# Patient Record
Sex: Male | Born: 1995
Health system: Southern US, Community
[De-identification: ages and names within clinical notes are randomized; demographics above are authoritative.]

## PROBLEM LIST (undated history)

## (undated) DIAGNOSIS — F419 Anxiety disorder, unspecified: Secondary | ICD-10-CM

## (undated) DIAGNOSIS — F17200 Nicotine dependence, unspecified, uncomplicated: Secondary | ICD-10-CM

## (undated) DIAGNOSIS — R519 Headache, unspecified: Secondary | ICD-10-CM

## (undated) DIAGNOSIS — M1990 Unspecified osteoarthritis, unspecified site: Secondary | ICD-10-CM

## (undated) DIAGNOSIS — I499 Cardiac arrhythmia, unspecified: Secondary | ICD-10-CM

## (undated) DIAGNOSIS — F32A Depression, unspecified: Secondary | ICD-10-CM

## (undated) DIAGNOSIS — F909 Attention-deficit hyperactivity disorder, unspecified type: Secondary | ICD-10-CM

## (undated) HISTORY — DX: Nicotine dependence, unspecified, uncomplicated: F17.200

## (undated) HISTORY — PX: LEG SURGERY: SHX1003

## (undated) HISTORY — DX: Unspecified osteoarthritis, unspecified site: M19.90

## (undated) HISTORY — DX: Attention-deficit hyperactivity disorder, unspecified type: F90.9

---
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
## 2013-11-03 ENCOUNTER — Encounter (HOSPITAL_COMMUNITY): Payer: Self-pay | Admitting: Emergency Medicine

## 2013-11-03 ENCOUNTER — Emergency Department (HOSPITAL_COMMUNITY)
Admission: EM | Admit: 2013-11-03 | Discharge: 2013-11-03 | Disposition: A | Discharge status: Home / Self Care | Payer: Self-pay | Attending: Emergency Medicine | Admitting: Emergency Medicine

## 2013-11-03 DIAGNOSIS — Y9389 Activity, other specified: Secondary | ICD-10-CM | POA: Insufficient documentation

## 2013-11-03 DIAGNOSIS — S199XXA Unspecified injury of neck, initial encounter: Principal | ICD-10-CM

## 2013-11-03 DIAGNOSIS — F17200 Nicotine dependence, unspecified, uncomplicated: Secondary | ICD-10-CM | POA: Insufficient documentation

## 2013-11-03 DIAGNOSIS — Y92410 Unspecified street and highway as the place of occurrence of the external cause: Secondary | ICD-10-CM | POA: Insufficient documentation

## 2013-11-03 DIAGNOSIS — S0993XA Unspecified injury of face, initial encounter: Secondary | ICD-10-CM | POA: Insufficient documentation

## 2013-11-03 MED ORDER — IBUPROFEN 400 MG PO TABS: 400.0000 mg | ORAL_TABLET | Freq: Four times a day (QID) | ORAL | Status: DC | PRN

## 2013-11-03 MED ORDER — METHOCARBAMOL 500 MG PO TABS: 500.0000 mg | ORAL_TABLET | Freq: Two times a day (BID) | ORAL | Status: DC
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
## 2013-11-03 NOTE — ED Provider Notes (Signed)
Medical screening examination/treatment/procedure(s) were performed by non-physician practitioner and as supervising physician I was immediately available for consultation/collaboration.  Richarda Blade, MD 11/03/13 [phone]
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
## 2014-04-26 LAB — BASIC METABOLIC PANEL
BUN: 10 mg/dL (ref 4–21)
CREATININE: 1.1 mg/dL (ref 0.6–1.3)
Glucose: 105 mg/dL
POTASSIUM: 4 mmol/L (ref 3.4–5.3)
Sodium: 140 mmol/L (ref 137–147)

## 2014-04-26 LAB — CBC AND DIFFERENTIAL
HCT: 46 % (ref 41–53)
Hemoglobin: 15.5 g/dL (ref 13.5–17.5)
Neutrophils Absolute: 60 /uL
Platelets: 269 10*3/uL (ref 150–399)
WBC: 6.2 10^3/mL

## 2014-04-26 LAB — HEPATIC FUNCTION PANEL: BILIRUBIN, TOTAL: 0.6 mg/dL

## 2014-10-07 ENCOUNTER — Ambulatory Visit (INDEPENDENT_AMBULATORY_CARE_PROVIDER_SITE_OTHER): Payer: 59 | Admitting: Family Medicine

## 2014-10-07 ENCOUNTER — Encounter: Payer: Self-pay | Admitting: Family Medicine

## 2014-10-07 VITALS — BP 120/62 | Temp 98.2°F | Ht 73.0 in | Wt 152.0 lb

## 2014-10-07 DIAGNOSIS — F411 Generalized anxiety disorder: Secondary | ICD-10-CM

## 2014-10-07 DIAGNOSIS — F329 Major depressive disorder, single episode, unspecified: Secondary | ICD-10-CM

## 2014-10-07 DIAGNOSIS — Z87891 Personal history of nicotine dependence: Secondary | ICD-10-CM | POA: Insufficient documentation

## 2014-10-07 DIAGNOSIS — R0789 Other chest pain: Secondary | ICD-10-CM

## 2014-10-07 DIAGNOSIS — Z7251 High risk heterosexual behavior: Secondary | ICD-10-CM

## 2014-10-07 DIAGNOSIS — G8929 Other chronic pain: Secondary | ICD-10-CM

## 2014-10-07 DIAGNOSIS — F17200 Nicotine dependence, unspecified, uncomplicated: Secondary | ICD-10-CM | POA: Insufficient documentation

## 2014-10-07 DIAGNOSIS — F32A Depression, unspecified: Secondary | ICD-10-CM | POA: Insufficient documentation

## 2014-10-07 DIAGNOSIS — R1013 Epigastric pain: Secondary | ICD-10-CM

## 2014-10-07 DIAGNOSIS — Z720 Tobacco use: Secondary | ICD-10-CM

## 2014-10-07 LAB — COMPREHENSIVE METABOLIC PANEL
ALT: 7 U/L (ref 0–53)
AST: 11 U/L (ref 0–37)
Albumin: 5 g/dL (ref 3.5–5.2)
Alkaline Phosphatase: 61 U/L (ref 39–117)
BUN: 12 mg/dL (ref 6–23)
CO2: 34 mEq/L — ABNORMAL HIGH (ref 19–32)
Calcium: 10.1 mg/dL (ref 8.4–10.5)
Chloride: 104 mEq/L (ref 96–112)
Creatinine, Ser: 1.21 mg/dL (ref 0.40–1.50)
GFR: 80.78 mL/min (ref >60.00)
Glucose, Bld: 88 mg/dL (ref 70–99)
Potassium: 4.5 mEq/L (ref 3.5–5.1)
Sodium: 142 mEq/L (ref 135–145)
Total Bilirubin: 1.3 mg/dL — ABNORMAL HIGH (ref 0.2–1.2)
Total Protein: 7.3 g/dL (ref 6.0–8.3)

## 2014-10-07 LAB — LIPID PANEL
Cholesterol: 167 mg/dL (ref 0–200)
HDL: 51.2 mg/dL (ref >39.00)
LDL Cholesterol: 92 mg/dL (ref 0–99)
NonHDL: 115.8
Total CHOL/HDL Ratio: 3
Triglycerides: 121 mg/dL (ref 0.0–149.0)
VLDL: 24.2 mg/dL (ref 0.0–40.0)

## 2014-10-07 LAB — CBC WITH DIFFERENTIAL/PLATELET
Basophils Absolute: 0 10*3/uL (ref 0.0–0.1)
Basophils Relative: 0.5 % (ref 0.0–3.0)
Eosinophils Absolute: 0.1 10*3/uL (ref 0.0–0.7)
Eosinophils Relative: 1.3 % (ref 0.0–5.0)
HCT: 48.8 % (ref 39.0–52.0)
Hemoglobin: 17.2 g/dL — ABNORMAL HIGH (ref 13.0–17.0)
Lymphocytes Relative: 33.9 % (ref 12.0–46.0)
Lymphs Abs: 2.1 10*3/uL (ref 0.7–4.0)
MCHC: 35.2 g/dL (ref 30.0–36.0)
MCV: 93.4 fl (ref 78.0–100.0)
Monocytes Absolute: 0.3 10*3/uL (ref 0.1–1.0)
Monocytes Relative: 5.2 % (ref 3.0–12.0)
Neutro Abs: 3.6 10*3/uL (ref 1.4–7.7)
Neutrophils Relative %: 59.1 % (ref 43.0–77.0)
Platelets: 289 10*3/uL (ref 150.0–400.0)
RBC: 5.22 Mil/uL (ref 4.22–5.81)
RDW: 12.1 % (ref 11.5–14.6)
WBC: 6.1 10*3/uL (ref 4.5–10.5)

## 2014-10-07 LAB — TSH: TSH: 0.93 u[IU]/mL (ref 0.35–5.50)

## 2014-10-07 LAB — LIPASE: Lipase: 30 U/L (ref 11.0–59.0)

## 2014-10-07 LAB — HEMOGLOBIN A1C: Hgb A1c MFr Bld: 5.3 % (ref 4.6–6.5)

## 2014-10-07 MED ORDER — CITALOPRAM HYDROBROMIDE 40 MG PO TABS: 40.0000 mg | ORAL_TABLET | Freq: Every day | ORAL | Status: DC
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
## 2014-10-07 NOTE — Assessment & Plan Note (Signed)
GAD7 score of 14. Seems to have physical manifestations from anxiety. Symptoms of chest and abdominal pain started after loss of brother a year ago. Atypical for cardiac source given duration, persistence, nonexertional and not relieved by rest. We did some basic bloodwork for chest pain with no risk factors outside of smoking. CMET and lipase also largely normal. Discussed if symptoms do not resolve consider further investigation but would want full 6 weeks on SSRI.   Start celexa with follow up within 7-10 days. Titrate to full tab of celexa at next visit.

## 2014-10-07 NOTE — Patient Instructions (Addendum)
Sign release of information at the front desk for records from [facility] Eventually get records from pediatrician for immunizations history.   You seem to have a combination of depression and anxiety. Fortunately both can be treated with 1 medication. See me within 7-10 days. Only take 1/2 a tab until you see [place] back. I will see you then 3-4 weeks from next visit  I think your anxiety is causing physical symptoms. If your symptoms do not resolve with treatment for anxiety, we will investigate more deeply.   Labs today   Taking the medicine as directed and not missing any doses is one of the best things you can do to treat your depression.  Here are some things to keep in mind:  1) Side effects (stomach upset, some increased anxiety) may happen before you notice a benefit.  These side effects typically go away over time. 2) Changes to your dose of medicine or a change in medication all together is sometimes necessary 3) Most people need to be on medication at least 6-12 months 4) Many people will notice an improvement within two weeks but the full effect of the medication can take up to 4-6 weeks 5) Stopping the medication when you start feeling better often results in a return of symptoms 6) If you start having thoughts of hurting yourself or others after starting this medicine, please call me at [phone] or call [phone] immediately.

## 2014-10-07 NOTE — Assessment & Plan Note (Signed)
Quit smoking a few days ago. Encouraged continued cessation.

## 2014-10-07 NOTE — Assessment & Plan Note (Signed)
Coexisting depression with PHQ9 of 11, patient not interested in CBT. Celexa 20 mg titrate to 40mg  next visit. Discussed AVS.

## 2014-10-10 ENCOUNTER — Encounter: Payer: Self-pay | Admitting: Family Medicine

## 2015-10-13 ENCOUNTER — Telehealth: Payer: Self-pay | Admitting: Family Medicine

## 2015-10-13 NOTE — Telephone Encounter (Signed)
Pt did not answer and no voicemail.  

## 2015-10-13 NOTE — Telephone Encounter (Signed)
See below

## 2015-10-13 NOTE — Telephone Encounter (Signed)
Yes, have him come as soon as possible but no later than 2:15 PM and we will see him. (if he cannot do this- could also come at 4:30 (arrive 15 minutes early)

## 2015-10-13 NOTE — Telephone Encounter (Signed)
Pt states he has flu like symptoms, cough,chills , headache,  would like to see Dr Yong Channel today. pls advise if OK to schedule.

## 2017-12-25 ENCOUNTER — Encounter: Payer: Self-pay | Admitting: Family Medicine

## 2017-12-25 ENCOUNTER — Other Ambulatory Visit (HOSPITAL_COMMUNITY)
Admission: RE | Admit: 2017-12-25 | Discharge: 2017-12-25 | Disposition: A | Discharge status: Home / Self Care | Payer: 59 | Source: Ambulatory Visit | Attending: Family Medicine | Admitting: Family Medicine

## 2017-12-25 ENCOUNTER — Ambulatory Visit (INDEPENDENT_AMBULATORY_CARE_PROVIDER_SITE_OTHER): Payer: 59

## 2017-12-25 ENCOUNTER — Ambulatory Visit: Payer: 59 | Admitting: Family Medicine

## 2017-12-25 VITALS — BP 118/72 | HR 58 | Temp 98.1°F | Ht 72.75 in | Wt 172.8 lb

## 2017-12-25 DIAGNOSIS — Z7251 High risk heterosexual behavior: Secondary | ICD-10-CM | POA: Diagnosis not present

## 2017-12-25 DIAGNOSIS — Z113 Encounter for screening for infections with a predominantly sexual mode of transmission: Secondary | ICD-10-CM | POA: Diagnosis not present

## 2017-12-25 DIAGNOSIS — Z23 Encounter for immunization: Secondary | ICD-10-CM | POA: Diagnosis not present

## 2017-12-25 DIAGNOSIS — Z114 Encounter for screening for human immunodeficiency virus [HIV]: Secondary | ICD-10-CM | POA: Diagnosis not present

## 2017-12-25 DIAGNOSIS — Z118 Encounter for screening for other infectious and parasitic diseases: Secondary | ICD-10-CM | POA: Diagnosis not present

## 2017-12-25 DIAGNOSIS — F17200 Nicotine dependence, unspecified, uncomplicated: Secondary | ICD-10-CM | POA: Diagnosis not present

## 2017-12-25 DIAGNOSIS — F329 Major depressive disorder, single episode, unspecified: Secondary | ICD-10-CM | POA: Diagnosis not present

## 2017-12-25 DIAGNOSIS — R0602 Shortness of breath: Secondary | ICD-10-CM | POA: Diagnosis not present

## 2017-12-25 DIAGNOSIS — R0989 Other specified symptoms and signs involving the circulatory and respiratory systems: Secondary | ICD-10-CM | POA: Diagnosis not present

## 2017-12-25 DIAGNOSIS — R5383 Other fatigue: Secondary | ICD-10-CM | POA: Diagnosis not present

## 2017-12-25 DIAGNOSIS — F32A Depression, unspecified: Secondary | ICD-10-CM

## 2017-12-25 LAB — COMPREHENSIVE METABOLIC PANEL
ALBUMIN: 4.8 g/dL (ref 3.5–5.2)
ALT: 9 U/L (ref 0–53)
AST: 13 U/L (ref 0–37)
Alkaline Phosphatase: 56 U/L (ref 39–117)
BUN: 14 mg/dL (ref 6–23)
CALCIUM: 9.9 mg/dL (ref 8.4–10.5)
CHLORIDE: 103 meq/L (ref 96–112)
CO2: 33 meq/L — AB (ref 19–32)
Creatinine, Ser: 1.18 mg/dL (ref 0.40–1.50)
GFR: 80.74 mL/min (ref >60.00)
Glucose, Bld: 84 mg/dL (ref 70–99)
POTASSIUM: 4.6 meq/L (ref 3.5–5.1)
Sodium: 141 mEq/L (ref 135–145)
Total Bilirubin: 0.7 mg/dL (ref 0.2–1.2)
Total Protein: 7.2 g/dL (ref 6.0–8.3)

## 2017-12-25 LAB — POC URINALSYSI DIPSTICK (AUTOMATED)
Bilirubin, UA: NEGATIVE
Blood, UA: NEGATIVE
Glucose, UA: NEGATIVE
KETONES UA: NEGATIVE
LEUKOCYTES UA: NEGATIVE
NITRITE UA: NEGATIVE
PH UA: 7 (ref 5.0–8.0)
PROTEIN UA: NEGATIVE
Spec Grav, UA: 1.025 (ref 1.010–1.025)
UROBILINOGEN UA: 1 U/dL

## 2017-12-25 LAB — CBC WITH DIFFERENTIAL/PLATELET
Basophils Absolute: 0 10*3/uL (ref 0.0–0.1)
Basophils Relative: 0.9 % (ref 0.0–3.0)
Eosinophils Absolute: 0.1 10*3/uL (ref 0.0–0.7)
Eosinophils Relative: 1.6 % (ref 0.0–5.0)
HCT: 45.6 % (ref 39.0–52.0)
Hemoglobin: 16 g/dL (ref 13.0–17.0)
Lymphocytes Relative: 40.2 % (ref 12.0–46.0)
Lymphs Abs: 1.4 10*3/uL (ref 0.7–4.0)
MCHC: 35.2 g/dL (ref 30.0–36.0)
MCV: 94.3 fl (ref 78.0–100.0)
Monocytes Absolute: 0.3 10*3/uL (ref 0.1–1.0)
Monocytes Relative: 7.4 % (ref 3.0–12.0)
Neutro Abs: 1.8 10*3/uL (ref 1.4–7.7)
Neutrophils Relative %: 49.9 % (ref 43.0–77.0)
Platelets: 265 10*3/uL (ref 150.0–400.0)
RBC: 4.83 Mil/uL (ref 4.22–5.81)
RDW: 12 % (ref 11.5–15.5)
WBC: 3.6 10*3/uL — ABNORMAL LOW (ref 4.0–10.5)

## 2017-12-25 LAB — TSH: TSH: 1.28 u[IU]/mL (ref 0.35–4.50)

## 2017-12-25 IMAGING — DX DG CHEST 2V
2 series · 2 of 2 positions shown · non-contrast
Comparison: None.

CLINICAL DATA: Chest congestion for 6 weeks.

EXAM:
CHEST - 2 VIEW

[chest pa]
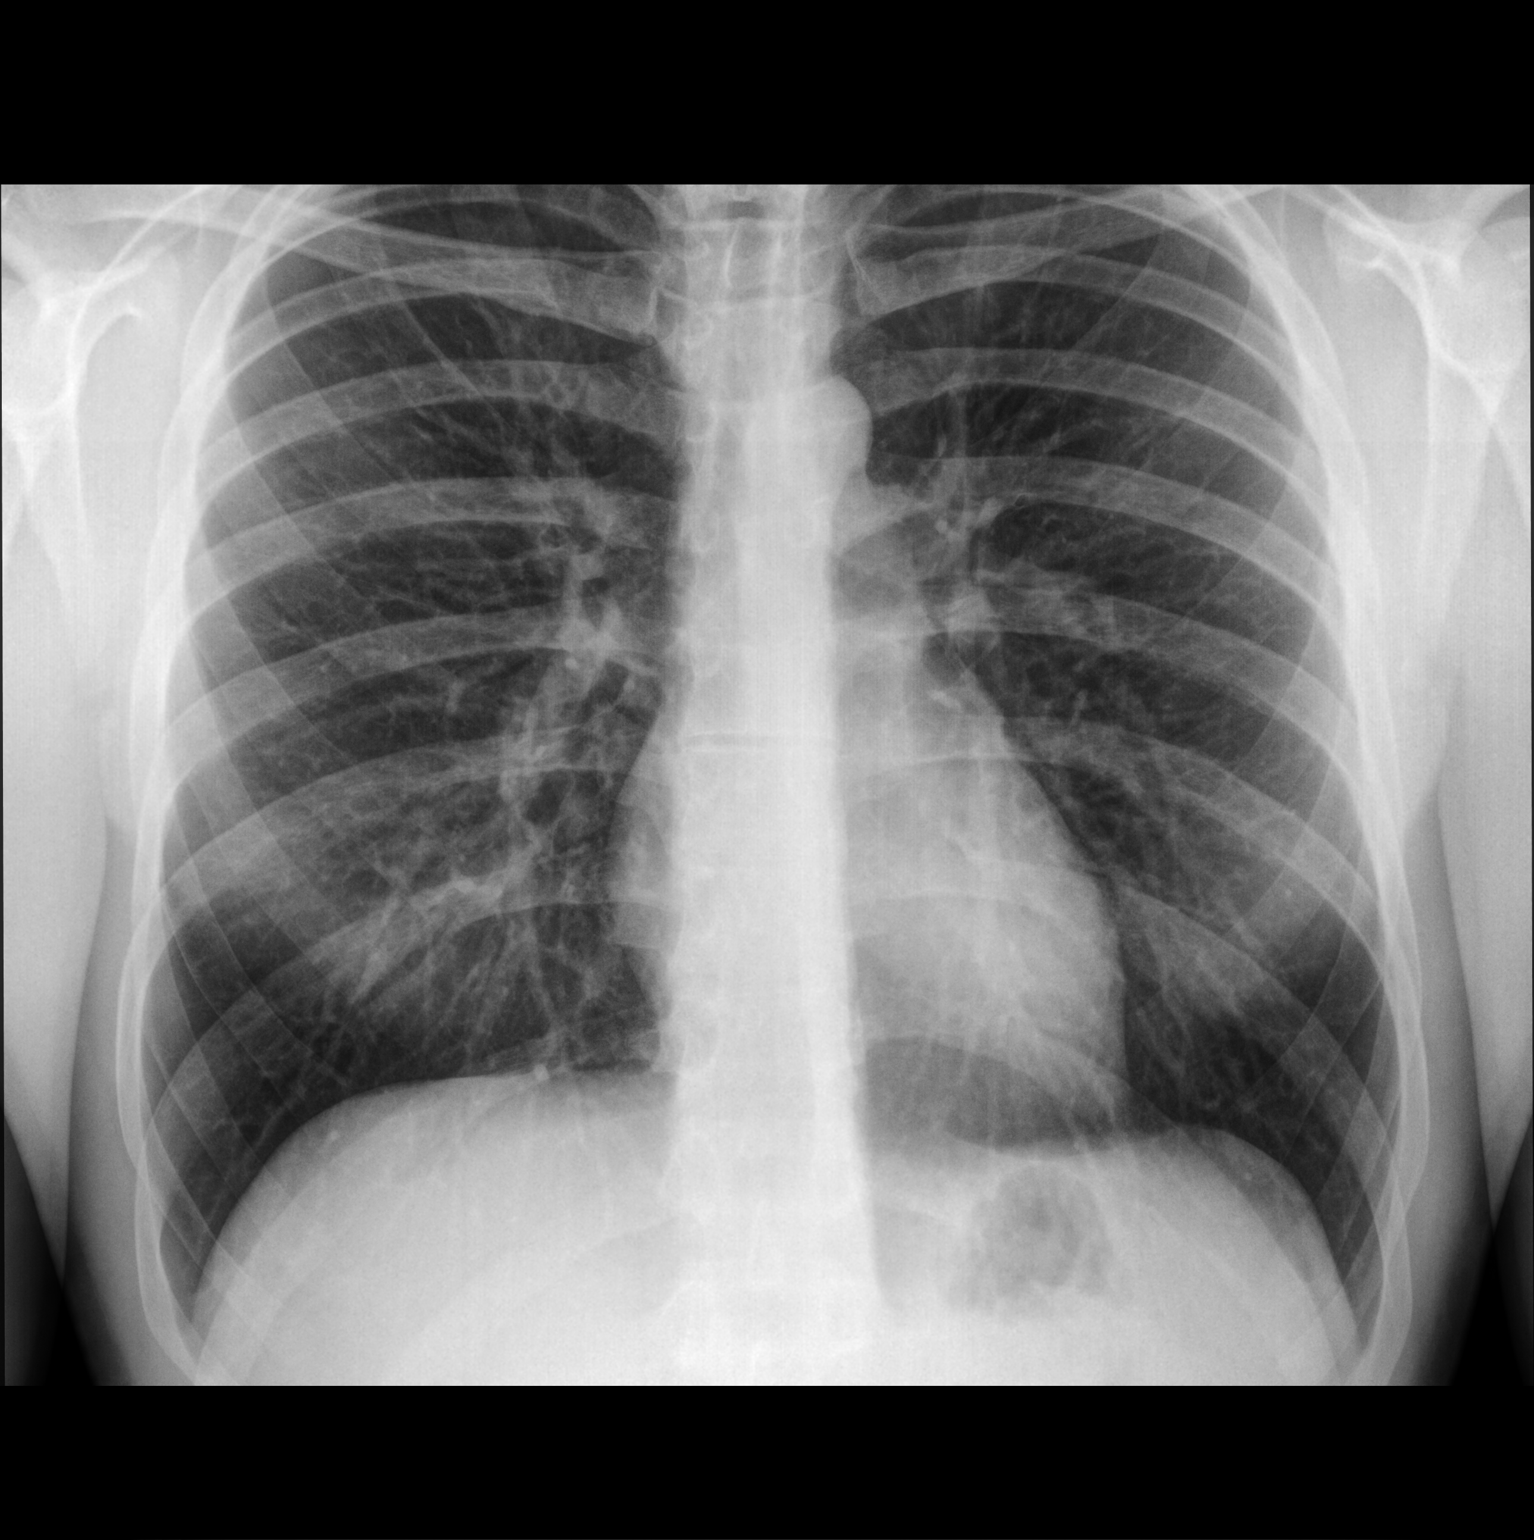

[chest lat]
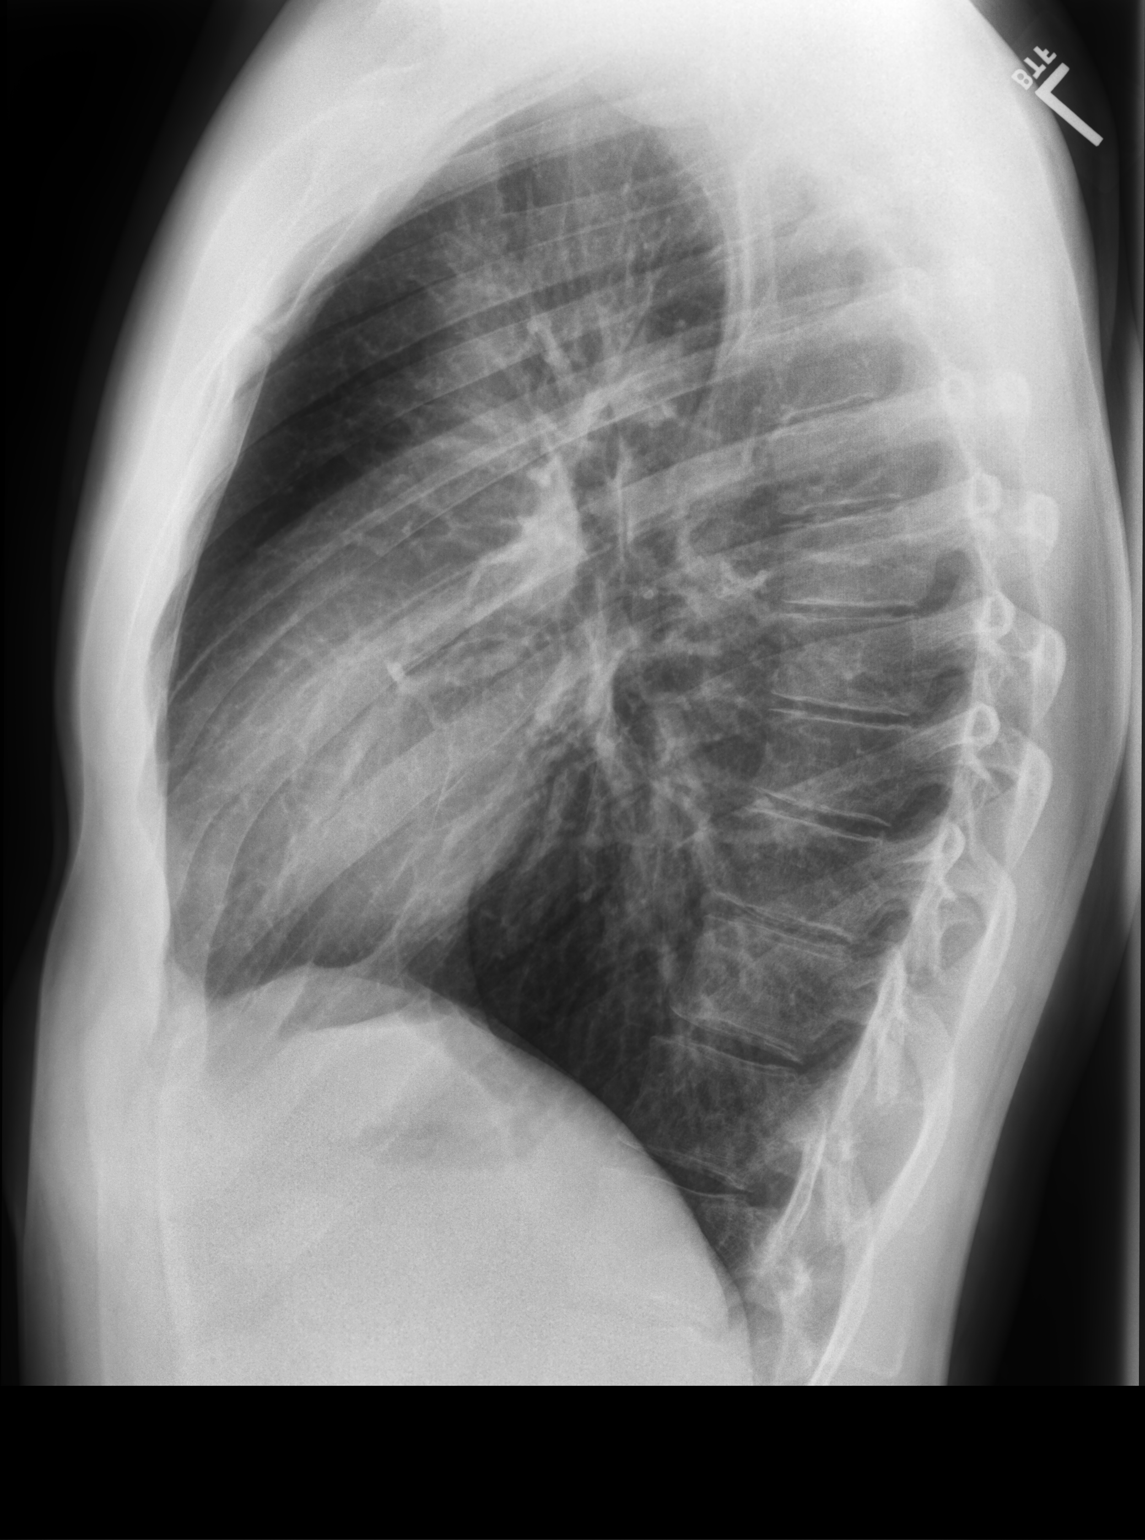

[2 of 2 positions shown; findings below may reference images not displayed]

FINDINGS: The heart size and mediastinal contours are within normal limits.
Both lungs are clear. No pleural effusion or pneumothorax. The
visualized skeletal structures are unremarkable.
IMPRESSION: Normal chest radiographs.

## 2017-12-25 NOTE — Progress Notes (Signed)
Phone: [phone]  Subjective:  Patient presents today to establish care.  Prior patient of this office- reestablishing after not being seen in 3 years. Chief complaint-noted.   See problem oriented charting  The following were reviewed and entered/updated in epic: Past Medical History:  Diagnosis Date  . Smoker    5 pack years, trying to quit   Patient Active Problem List   Diagnosis Date Noted  . GAD (generalized anxiety disorder) 10/07/2014    Priority: Medium  . Depression 10/07/2014    Priority: Medium  . Unprotected sex 10/07/2014    Priority: Low  . Smoker     Priority: Low   Past Surgical History:  Procedure Laterality Date  . LEG SURGERY     within a month of birth, in cast for infancy    Family History  Problem Relation Age of Onset  . Hypertension Mother   . Hyperlipidemia Mother   . Depression Mother        after loss of son  . Anxiety disorder Mother        after loss of son  . Colon polyps Mother        at 60  . Other Father        does not talk to dad  . Suicidality Brother        75, states was ruled accident, family think suicide  . Obesity Sister   . Syncope episode Sister   . COPD Maternal Grandmother   . Heart disease Maternal Grandfather     Medications- reviewed and updated No current outpatient medications on file.   No current facility-administered medications for this visit.     Allergies-reviewed and updated No Known Allergies  Social History   Social History Narrative   Long term relationship- live together. 53 year old son Quillian Quince in 12/2017         Working on Advertising account executive. Focusing on school right now   HS graduate      Hobbies: time with girlfriend and son, netflix    ROS--Full ROS was completed Review of Systems  Constitutional: Positive for malaise/fatigue. Negative for chills and fever.  HENT: Negative for ear discharge and ear pain.   Eyes: Negative for blurred vision and double vision.    Respiratory: Positive for shortness of breath. Negative for wheezing.   Cardiovascular: Negative for chest pain and palpitations.  Gastrointestinal: Negative for abdominal pain and vomiting.  Genitourinary: Negative for flank pain and hematuria.  Musculoskeletal: Negative for falls and joint pain.  Skin: Negative for itching and rash.  Neurological: Negative for focal weakness and seizures.  Endo/Heme/Allergies: Negative for polydipsia. Does not bruise/bleed easily.  Psychiatric/Behavioral: Negative for hallucinations and substance abuse.   Objective: BP 118/72 (BP Location: Left Arm, Patient Position: Sitting, Cuff Size: Large)   Pulse (!) 58   Temp 98.1 F (36.7 C) (Oral)   Ht 6' 0.75" (1.848 m)   Wt 172 lb 12.8 oz (78.4 kg)   SpO2 97%   BMI 22.96 kg/m  Gen: NAD, resting comfortably HEENT: Mucous membranes are moist. Oropharynx normal. TM normal. Eyes: sclera and lids normal, PERRLA Neck: no thyromegaly, no cervical lymphadenopathy CV: RRR no murmurs rubs or gallops Lungs: CTAB no crackles, wheeze, rhonchi Abdomen: soft/nontender/nondistended/normal bowel sounds. No rebound or guarding.  Ext: no edema Skin: warm, dry Neuro: 5/5 strength in upper and lower extremities, normal gait, normal reflexes No lymphadenopathy in groin, axilla, neck, supraclavicular  Assessment/Plan:  Screening STDs  S:  unprotected sex with GF. Had prior partners - unprotected and never tested. No penile discharge. Sometimes incomplete voiding. No dysuria.  A/P: will update std testing as per orders below  Fatigue S: deals with fatigue regularly. Admits to poor sleep schedule then has to wake up with his son. Even if takes a nap never feels fully rested. Does not snore. Also very hungry and hard to curb appetite. States getting a few more bruises. No night sweats. No unintentional weight loss- actually has gained A/P: we will start with some basics- cbc diff, cmp, tsh. Given his smoking history  (strongly encouraged cessation but he is not ready). Only abnormalities noted- WBC decrease- will repeat CBC with diff in a few weeks. He is to see [place] back if not improving or if next CBC diff worsening would consider further workup. Family history polyp in colon in mother- could also Consider stool cards  Smoker S: smoking 1-2 a week. Stress still a trigger. Had quit for 1.5 years A/P: encouraged complete cessation  Depression S: PHQ9 of 11 today. Admits to mild depressed mood. Fatigue is his bigger concern.  A/P: he is not interested in medication. No SI. He is not interested in counseling. If symptoms worsen- he agreed to let us [facility] and follow up or if he changes his mind about treatment.    Future Appointments  Date Time Provider [place]  01/15/2018  9:45 AM Marin Olp, MD [facility]    Lab/Order associations: Need for prophylactic vaccination with combined diphtheria-tetanus-pertussis (DTP) vaccine - Plan: Tdap vaccine greater than or equal to 7yo IM  Fatigue, unspecified type - Plan: CBC with Differential/Platelet, Comprehensive metabolic panel, TSH, POCT Urinalysis Dipstick (Automated), DG Chest 2 View  Shortness of breath - Plan: CBC with Differential/Platelet, Comprehensive metabolic panel, TSH, POCT Urinalysis Dipstick (Automated), DG Chest 2 View  Screening for HIV (human immunodeficiency virus) - Plan: HIV antibody  Screening examination for venereal disease - Plan: RPR  Screening for chlamydial disease - Plan: Urine cytology ancillary only, Urine cytology ancillary only  Screening for gonorrhea - Plan: Urine cytology ancillary only, Urine cytology ancillary only  Return precautions advised. Garret Reddish, MD

## 2017-12-25 NOTE — Progress Notes (Signed)
Chest x-ray was normal.

## 2017-12-25 NOTE — Progress Notes (Signed)
Other labs pending Your urine was normal Your CBC was largely normal (blood counts, infection fighting cells, platelets). Your white blood cells were a hair low- team lets repeat leukocytecbc with differential in 3 weeks under leukopenia.  Please help him set this up Your CMET was largely normal (kidney, liver, and electrolytes, blood sugar).  Your thyroid was normal.

## 2017-12-25 NOTE — Patient Instructions (Addendum)
Health Maintenance Due  Topic Date Due  . HIV Screening - today 03/07/2009  . TETANUS/TDAP - today 03/07/2013   Please stop by lab before you go Urine Blood X-ray  I want to see you back in 2-4 weeks to check in on how you are doing- may see you sooner if bloodwork concerning  Strongly encourage you to quit smoking

## 2017-12-25 NOTE — Assessment & Plan Note (Signed)
S: smoking 1-2 a week. Stress still a trigger. Had quit for 1.5 years A/P: encouraged complete cessation

## 2017-12-25 NOTE — Assessment & Plan Note (Signed)
S: PHQ9 of 11 today. Admits to mild depressed mood. Fatigue is his bigger concern.  A/P: he is not interested in medication. No SI. He is not interested in counseling. If symptoms worsen- he agreed to let us [facility] and follow up or if he changes his mind about treatment.

## 2017-12-26 ENCOUNTER — Other Ambulatory Visit: Payer: Self-pay

## 2017-12-26 DIAGNOSIS — D72819 Decreased white blood cell count, unspecified: Secondary | ICD-10-CM

## 2017-12-26 LAB — HIV ANTIBODY (ROUTINE TESTING W REFLEX): HIV 1&2 Ab, 4th Generation: NONREACTIVE

## 2017-12-26 LAB — RPR: RPR: NONREACTIVE

## 2017-12-26 LAB — URINE CYTOLOGY ANCILLARY ONLY
Chlamydia: NEGATIVE
Neisseria Gonorrhea: NEGATIVE

## 2017-12-27 NOTE — Progress Notes (Signed)
HIV negative. Syphilis negative. Gonorrhea/chlamydia  negative.

## 2017-12-29 ENCOUNTER — Other Ambulatory Visit: Payer: Self-pay

## 2018-01-15 ENCOUNTER — Encounter: Payer: 59 | Admitting: Family Medicine

## 2018-01-15 DIAGNOSIS — Z0289 Encounter for other administrative examinations: Secondary | ICD-10-CM

## 2018-01-16 ENCOUNTER — Encounter: Payer: Self-pay | Admitting: Family Medicine

## 2018-01-16 NOTE — Progress Notes (Signed)
Patient no showed for appointment This encounter was created in error - please disregard.

## 2019-06-28 ENCOUNTER — Ambulatory Visit: Payer: Self-pay | Admitting: *Deleted

## 2019-06-28 NOTE — Telephone Encounter (Signed)
Patient is calling to report he has had L testicle pain that comes and goes for about 2 weeks now- seems to be getting worse- more frequent. Call to office for appointment  Reason for Disposition . [1] Pain comes and goes (intermittent) AND [2] present > 24 hours  Answer Assessment - Initial Assessment Questions 1. LOCATION and RADIATION: "Where is the pain located?"      Left testicle 2. QUALITY: "What does the pain feel like?"  (e.g., sharp, dull, aching, burning)     Dull pain that comes and goes 3. SEVERITY: "How bad is the pain?"  (Scale 1-10; or mild, moderate, severe)   - MILD (1-3): doesn't interfere with normal activities    - MODERATE (4-7): interferes with normal activities (e.g., work or school) or awakens from sleep   - SEVERE (8-10): excruciating pain, unable to do any normal activities, difficulty walking     mild 4. ONSET: "When did the pain start?"     2 weeks- started as once daily- now he is noticing it more throughout the day 5. PATTERN: "Does it come and go, or has it been constant since it started?"     Comes and goe 6. SCROTAL APPEARANCE: "What does the scrotum look like?" "Is there any swelling or redness?"      No swelling no redness- left testicle general is lower than right 7. HERNIA: "Has a doctor ever told you that you have a hernia?"     no 8. OTHER SYMPTOMS: "Do you have any other symptoms?" (e.g., fever, abdominal pain, vomiting, difficulty passing urine)     Over past 2 days- abdominal pain on L  Protocols used: SCROTAL PAIN-A-AH

## 2019-06-29 ENCOUNTER — Encounter: Payer: Self-pay | Admitting: Family Medicine

## 2019-06-29 ENCOUNTER — Ambulatory Visit (HOSPITAL_COMMUNITY)
Admission: RE | Admit: 2019-06-29 | Discharge: 2019-06-29 | Disposition: A | Discharge status: Home / Self Care | Payer: 59 | Source: Ambulatory Visit | Attending: Family Medicine | Admitting: Family Medicine

## 2019-06-29 ENCOUNTER — Other Ambulatory Visit: Payer: Self-pay

## 2019-06-29 ENCOUNTER — Ambulatory Visit (INDEPENDENT_AMBULATORY_CARE_PROVIDER_SITE_OTHER): Payer: 59 | Admitting: Family Medicine

## 2019-06-29 VITALS — BP 130/80 | HR 66 | Temp 97.8°F | Ht 72.75 in | Wt 146.8 lb

## 2019-06-29 DIAGNOSIS — Z23 Encounter for immunization: Secondary | ICD-10-CM | POA: Diagnosis not present

## 2019-06-29 DIAGNOSIS — N50812 Left testicular pain: Secondary | ICD-10-CM

## 2019-06-29 LAB — POC URINALSYSI DIPSTICK (AUTOMATED)
Bilirubin, UA: NEGATIVE
Blood, UA: NEGATIVE
Glucose, UA: NEGATIVE
Ketones, UA: NEGATIVE
Leukocytes, UA: NEGATIVE
Nitrite, UA: NEGATIVE
Protein, UA: NEGATIVE
Spec Grav, UA: 1.015 (ref 1.010–1.025)
Urobilinogen, UA: 0.2 E.U./dL
pH, UA: 6 (ref 5.0–8.0)

## 2019-06-29 IMAGING — US US SCROTUM W/ DOPPLER COMPLETE
1 series · 14 of 25 positions shown · non-contrast
Comparison: None.

CLINICAL DATA: Left testicular pain for 1 year

EXAM:
SCROTAL ULTRASOUND
DOPPLER ULTRASOUND OF THE TESTICLES
TECHNIQUE: Complete ultrasound examination of the testicles, epididymis, and
other scrotal structures was performed. Color and spectral Doppler
ultrasound were also utilized to evaluate blood flow to the
testicles.

[Series 1: us scrotum w/ doppler complete · 14 of 59 slices shown]
[im 1/59]
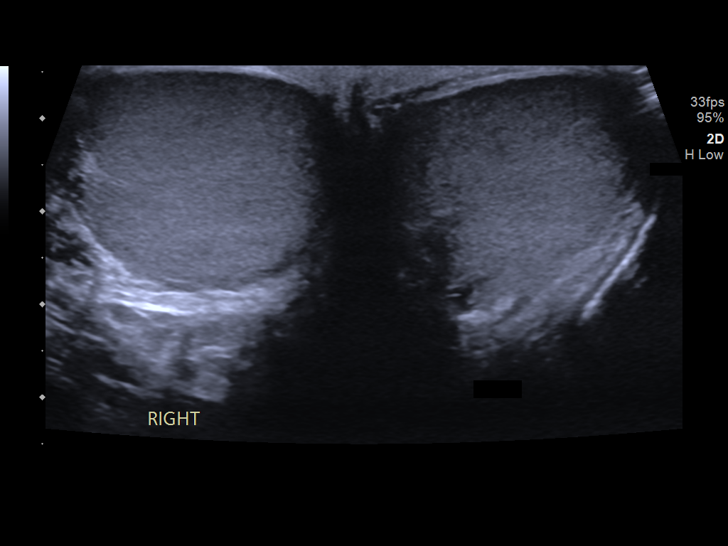
[im 5/59]
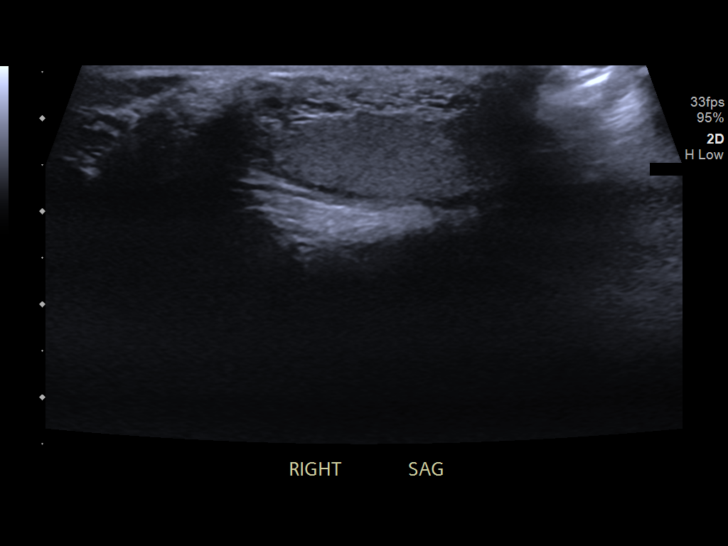
[im 10/59]
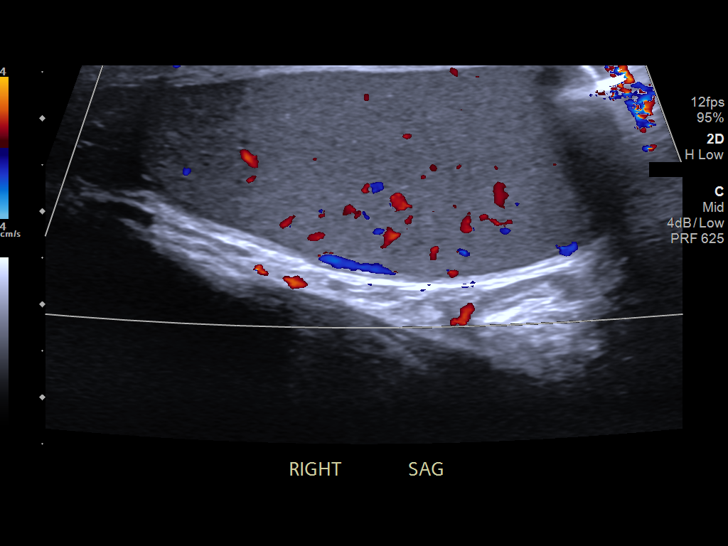
[im 15/59]
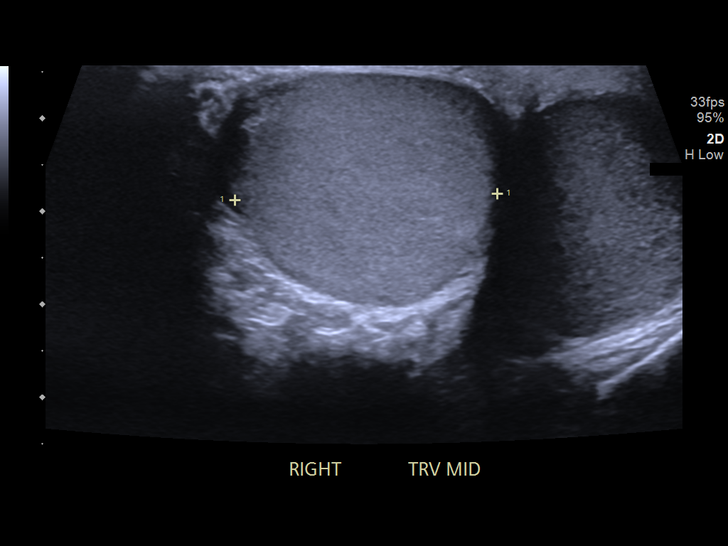
[im 20/59]
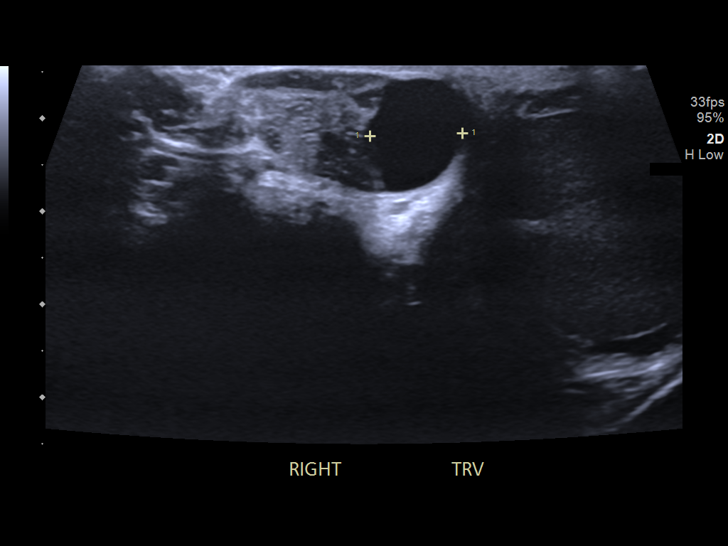
[im 22/59]
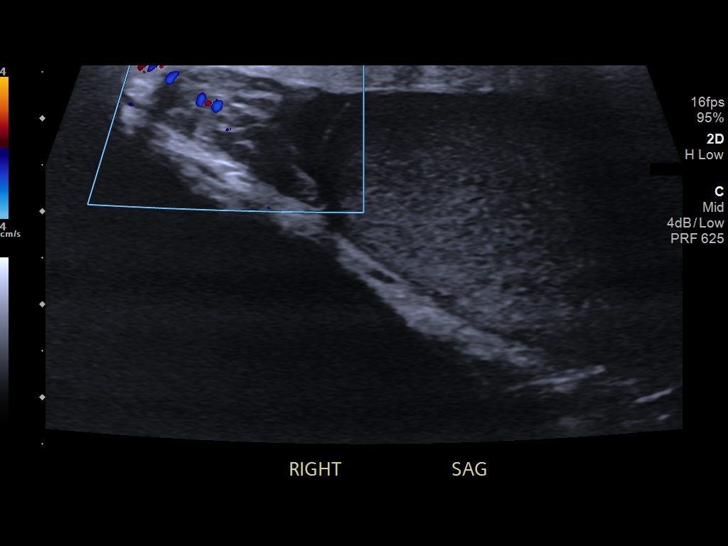
[im 27/59]
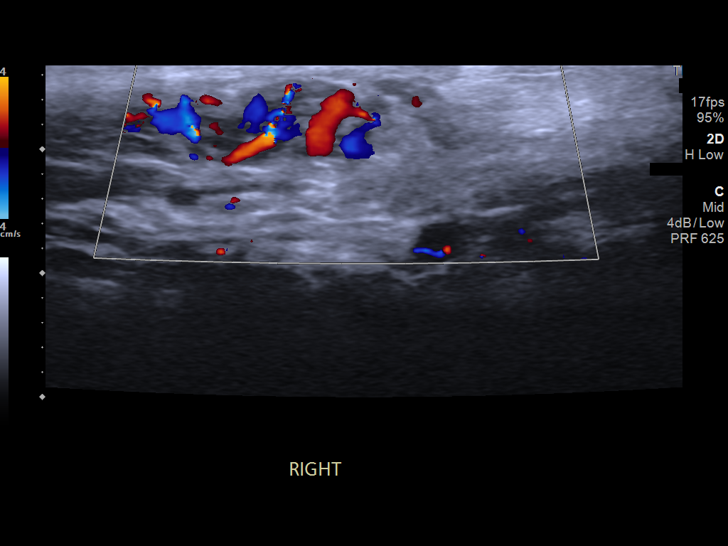
[im 32/59]
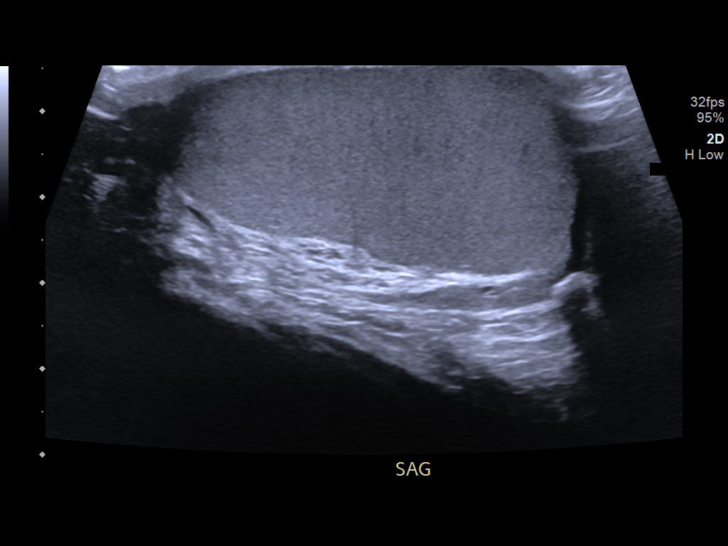
[im 37/59]
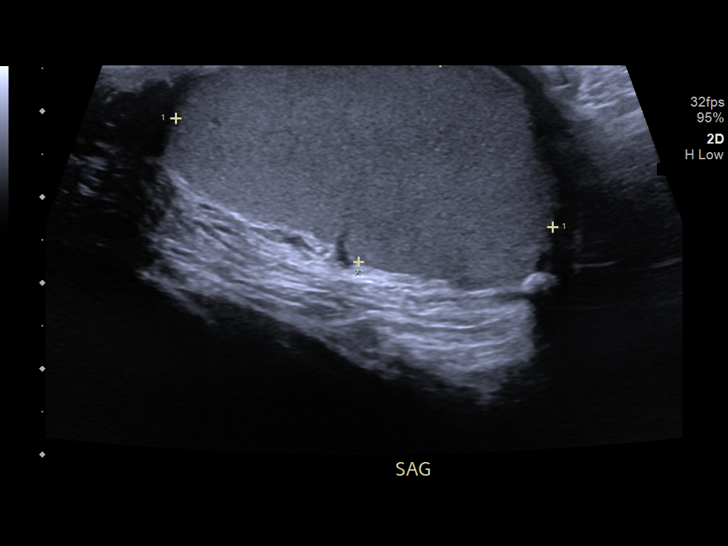
[im 39/59]
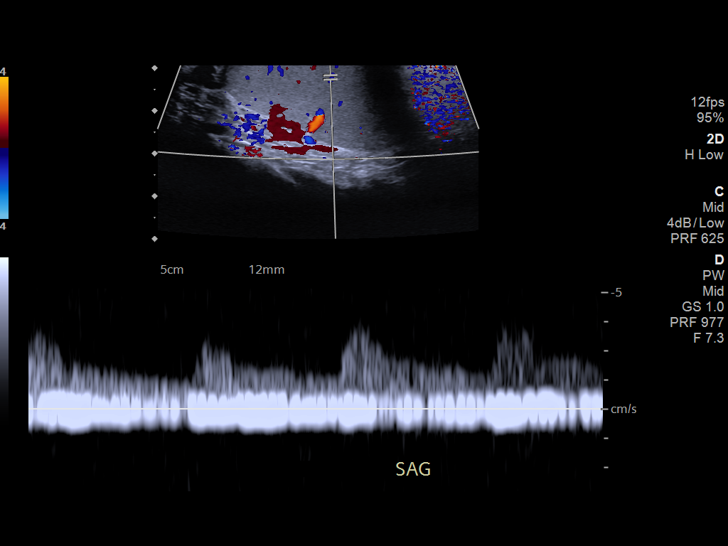
[im 44/59]
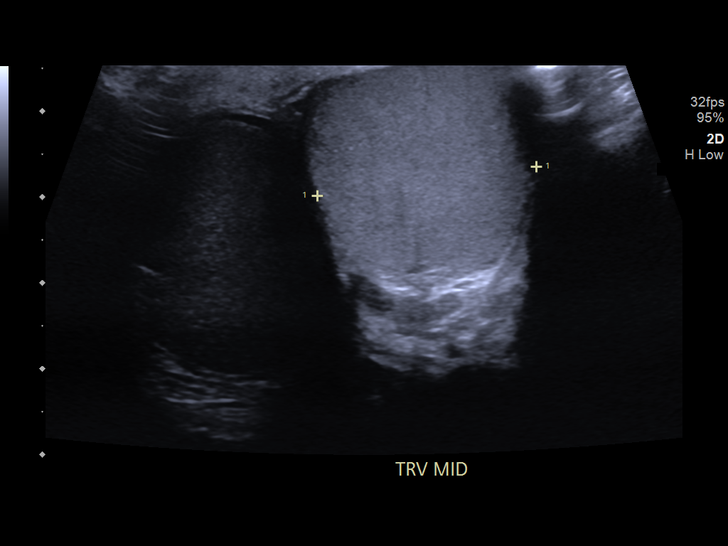
[im 49/59]
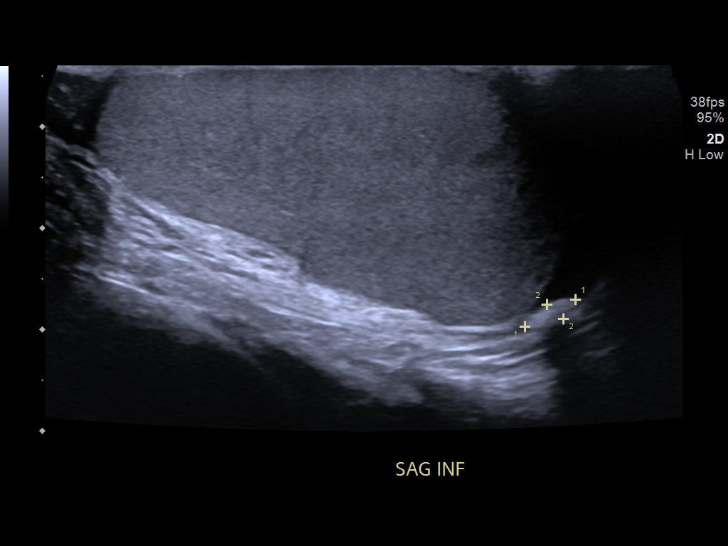
[im 54/59]
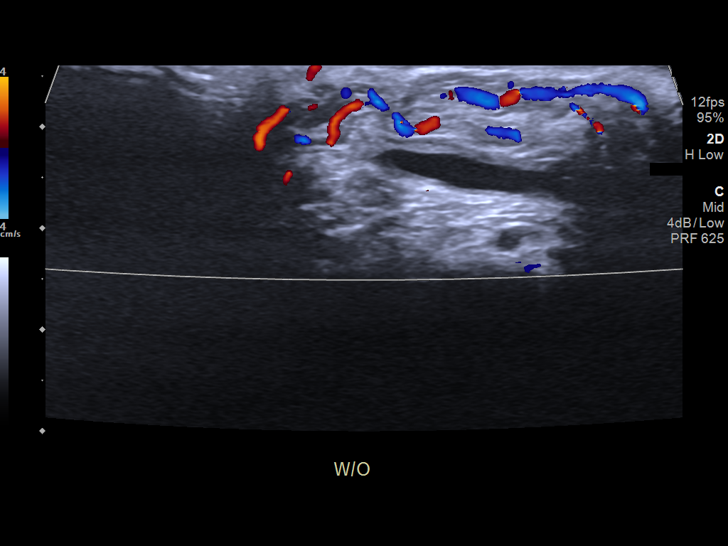
[im 59/59]
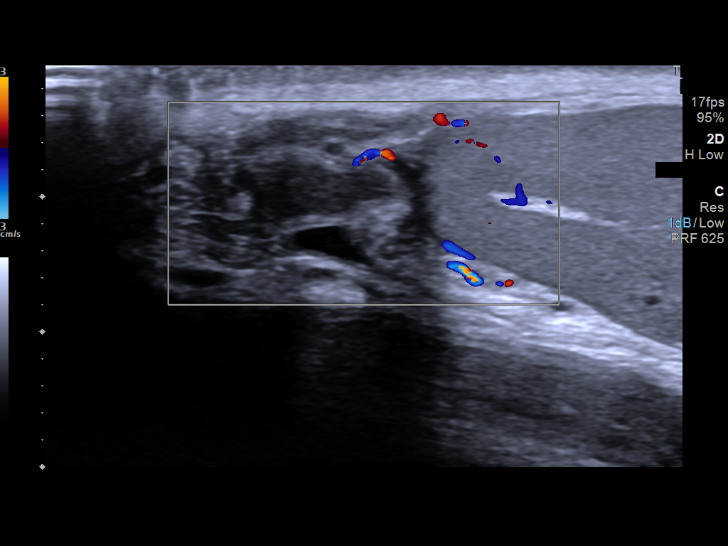

[14 of 25 positions shown; findings below may reference images not displayed]

FINDINGS: Right testicle

Measurements: 5.2 x 1.9 x 2.8 cm. No mass or microlithiasis
visualized.

Left testicle

Measurements: 4.6 x 2.5 x 2.6 cm. No mass or microlithiasis
visualized.

Right epididymis: Incidental 1.3 cm cyst of the right epididymal
head. Otherwise in size and appearance.

Left epididymis:  Normal in size and appearance.

Hydrocele: Small left hydrocele. Incidental small left-sided
scrotolith measuring 7 mm.

Varicocele:  None visualized.

Pulsed Doppler interrogation of both testes demonstrates normal low
resistance arterial and venous waveforms bilaterally.
IMPRESSION: 1.  Small, nonspecific left-sided hydrocele.

2. Normal ultrasound appearance of the bilateral testicles. Arterial
and venous Doppler flow is identified bilaterally.

## 2019-06-29 NOTE — Addendum Note (Signed)
Addended by: Francis Dowse T on: 06/29/2019 10:16 AM   Modules accepted: Orders
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
## 2019-06-29 NOTE — Patient Instructions (Addendum)
Health Maintenance Due  Topic Date Due  . INFLUENZA VACCINE -today 03/13/2019   Recommended follow up: as needed for this issue particularly if symptoms fail to improve or worsen should contact us immediately   Sit tight in room until we get the ultrasound scheduled  Please stop by lab before you go If you do not have mychart- we will call you about results within 5 business days of [place] receiving them.  If you have mychart- we will send your results within 3 business days of [place] receiving them.  If abnormal or we want to clarify a result, we will call or mychart you to make sure you receive the message.  If you have questions or concerns or don't hear within 5-7 days, please send [place] a message or call us.

## 2019-06-30 ENCOUNTER — Other Ambulatory Visit: Payer: Self-pay

## 2019-06-30 DIAGNOSIS — N433 Hydrocele, unspecified: Secondary | ICD-10-CM

## 2021-01-28 DIAGNOSIS — F17200 Nicotine dependence, unspecified, uncomplicated: Secondary | ICD-10-CM | POA: Insufficient documentation

## 2021-03-13 ENCOUNTER — Other Ambulatory Visit: Payer: Self-pay

## 2021-03-13 ENCOUNTER — Ambulatory Visit (INDEPENDENT_AMBULATORY_CARE_PROVIDER_SITE_OTHER): Payer: Medicaid Other

## 2021-03-13 ENCOUNTER — Ambulatory Visit: Payer: Medicaid Other | Admitting: Podiatry

## 2021-03-13 ENCOUNTER — Ambulatory Visit: Payer: Medicaid Other

## 2021-03-13 DIAGNOSIS — M958 Other specified acquired deformities of musculoskeletal system: Secondary | ICD-10-CM | POA: Diagnosis not present

## 2021-03-13 DIAGNOSIS — Q749 Unspecified congenital malformation of limb(s): Secondary | ICD-10-CM | POA: Diagnosis not present

## 2021-03-13 DIAGNOSIS — M2170 Unequal limb length (acquired), unspecified site: Secondary | ICD-10-CM

## 2021-03-13 DIAGNOSIS — Q6689 Other  specified congenital deformities of feet: Secondary | ICD-10-CM | POA: Diagnosis not present

## 2021-03-13 DIAGNOSIS — M79672 Pain in left foot: Secondary | ICD-10-CM

## 2021-03-13 DIAGNOSIS — Q899 Congenital malformation, unspecified: Secondary | ICD-10-CM

## 2021-03-13 DIAGNOSIS — Q6652 Congenital pes planus, left foot: Secondary | ICD-10-CM

## 2021-03-13 DIAGNOSIS — M25572 Pain in left ankle and joints of left foot: Secondary | ICD-10-CM

## 2021-03-13 NOTE — Progress Notes (Addendum)
  Subjective:  Patient ID: Shawn Harper, male    DOB: January 24, 1996,  MRN: [id number]  Chief Complaint  Patient presents with   Foot Pain    foot and ankle pain due to birth defects    25 y.o. male presents with the above complaint. History confirmed with patient.  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  Has lived with his all his life but is becoming increasingly painful.  It is affecting his ability to work and take care of his son.  Pain is mostly in the medial ankle.  His foot and toes are difficult to move.  Describes it as a dull throbbing aching and shooting pain.  He says his left leg is shorter than his right and this is affecting his hips knees and back.  Sugars changes and inserts in his shoes have not helped with this  Objective:  Physical Exam: warm, good capillary refill, no trophic changes or ulcerative lesions, normal DP and PT pulses, and normal sensory exam. Left Foot: Rigid pes planovalgus deformity, his left lower extremity is shorter than the right, femoral and tibial segments appear to be of moderately equal length the discrepancy appears to be within the foot itself, he has difficulty with range of motion of the toes actively and passively       Radiographs: Multiple views x-ray of the left foot and ankle were taken he has severe pes planovalgus, likely subtalar coalition, flattened navicular bone with collapse medially, the ankle joint has a likely kissing osteochondral lesion of the medial tibia and talus appears to have middle facet coalition on calcaneal views Assessment:   1. Congenital abnormality of limb   2. Pes planus, rigid, left   3. Acquired unequal limb length   4. Osteochondral defect of ankle   5. Coalition, talocalcaneal      Plan:  Patient was evaluated and treated and all questions answered.  Reviewed the clinical and radiographic findings  with the patient.  He has a severe rigid flatfoot with sequela of prior likely talipes equinovarus versus vertical talus deformity.  His deformity is quite severe and is beginning to affect his ability to function and perform ADLs.  He is interested in a more permanent solution.  We discussed nonsurgical treatment with bracing and heel lifts as well as surgical treatment with reconstructive surgery.  I am ordering an MRI to evaluate the joint surfaces and tendinous structures of the left foot and ankle.  Both studies are required to evaluate the full extent of the deformity and deficits.  He will return to see me after the studies are completed for further possible treatment options.  In the interim I gave him heel lifts to alleviate some of the limb length discrepancy  No follow-ups on file.

## 2021-03-18 ENCOUNTER — Encounter: Payer: Self-pay | Admitting: Podiatry

## 2021-03-18 NOTE — Progress Notes (Signed)
  Subjective:  Patient ID: Shawn Harper, male    DOB: [birth year],  MRN: [id number]  Chief Complaint  Patient presents with   Foot Pain    foot and ankle pain due to birth defects    25 y.o. male presents with the above complaint. History confirmed with patient.  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  Has lived with his all his life but is becoming increasingly painful.  It is affecting his ability to work and take care of his son.  Pain is mostly in the medial ankle.  His foot and toes are difficult to move.  Describes it as a dull throbbing aching and shooting pain.  He says his left leg is shorter than his right and this is affecting his hips knees and back.  Sugars changes and inserts in his shoes have not helped with this  Objective:  Physical Exam: warm, good capillary refill, no trophic changes or ulcerative lesions, normal DP and PT pulses, and normal sensory exam. Left Foot: Rigid pes planovalgus deformity, his left lower extremity is shorter than the right, femoral and tibial segments appear to be of moderately equal length the discrepancy appears to be within the foot itself, he has difficulty with range of motion of the toes actively and passively       Radiographs: Multiple views x-ray of the left foot and ankle were taken he has severe pes planovalgus, likely subtalar coalition, flattened navicular bone with collapse medially, the ankle joint has a likely kissing osteochondral lesion of the medial tibia and talus appears to have middle facet coalition on calcaneal views Assessment:   1. Congenital abnormality of limb   2. Pes planus, rigid, left   3. Acquired unequal limb length   4. Osteochondral defect of ankle   5. Coalition, talocalcaneal      Plan:  Patient was evaluated and treated and all questions answered.  Reviewed the clinical and radiographic findings  with the patient.  He has a severe rigid flatfoot with sequela of prior likely talipes equinovarus versus vertical talus deformity.  His deformity is quite severe and is beginning to affect his ability to function and perform ADLs.  He is interested in a more permanent solution.  We discussed nonsurgical treatment with bracing and heel lifts as well as surgical treatment with reconstructive surgery.  I am ordering an MRI to evaluate the joint surfaces and tendinous structures of the left foot and ankle.  Both studies are required to evaluate the full extent of the deformity and deficits.  He will return to see me after the studies are completed for further possible treatment options.  In the interim I gave him heel lifts to alleviate some of the limb length discrepancy  No follow-ups on file.

## 2021-03-27 ENCOUNTER — Other Ambulatory Visit: Payer: Self-pay | Admitting: Podiatry

## 2021-03-27 DIAGNOSIS — Q899 Congenital malformation, unspecified: Secondary | ICD-10-CM

## 2021-04-02 ENCOUNTER — Encounter: Payer: Self-pay | Admitting: Physician Assistant

## 2021-04-02 ENCOUNTER — Other Ambulatory Visit: Payer: Self-pay

## 2021-04-02 ENCOUNTER — Ambulatory Visit (INDEPENDENT_AMBULATORY_CARE_PROVIDER_SITE_OTHER): Payer: Medicaid Other | Admitting: Physician Assistant

## 2021-04-02 VITALS — BP 133/70 | HR 66 | Temp 97.7°F | Ht 72.0 in | Wt 161.0 lb

## 2021-04-02 DIAGNOSIS — D485 Neoplasm of uncertain behavior of skin: Secondary | ICD-10-CM | POA: Diagnosis not present

## 2021-04-02 NOTE — Progress Notes (Signed)
Acute Office Visit  Subjective:    Patient ID: DRAELYN SERVICE, male    DOB: 02/26/1996, 25 y.o.   MRN: [id number]  Chief Complaint  Patient presents with   Nevus    HPI Patient is in today for skin check. Noticed a mole on his left forearm that has suddenly growing in size, is raised, and darker than previously. He would also like other moles examined today as well. No hx of melanoma in family or personally. No fevers or chills. No other symptoms.   Past Medical History:  Diagnosis Date   Smoker    5 pack years, trying to quit    Past Surgical History:  Procedure Laterality Date   LEG SURGERY     within a month of birth, in cast for infancy    Family History  Problem Relation Age of Onset   Hypertension Mother    Hyperlipidemia Mother    Depression Mother        after loss of son   Anxiety disorder Mother        after loss of son   Colon polyps Mother        at 29   Other Father        does not talk to dad   Suicidality Brother        34, states was ruled accident, family think suicide   Obesity Sister    Syncope episode Sister    COPD Maternal Grandmother    Heart disease Maternal Grandfather     Social History   Socioeconomic History   Marital status: Single    Spouse name: Not on file   Number of children: Not on file   Years of education: Not on file   Highest education level: Not on file  Occupational History   Not on file  Tobacco Use   Smoking status: Former    Packs/day: 0.25    Types: Cigarettes   Smokeless tobacco: Never  Vaping Use   Vaping Use: Never used  Substance and Sexual Activity   Alcohol use: Yes    Alcohol/week: 0.0 - 1.0 standard drinks    Comment: occasional beer    Drug use: Yes    Types: Marijuana    Comment: encouraged against use   Sexual activity: Yes    Partners: Female  Other Topics Concern   Not on file  Social History Narrative   Long term relationship- live together. 27 year old son Quillian Quince in 12/2017.        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   No facility-administered medications prior to visit.    No Known Allergies  Review of Systems REFER TO HPI FOR PERTINENT POSITIVES AND NEGATIVES     Objective:    Physical Exam Skin:    Comments: Left Forearm - 0.3 x 0.2 cm raised oval dark brown lesion  Left axillary chest wall - 0.2 cm x 0.2 cm raised dark brown  lesion  Right posterior auricle - 0.2 cm x 0.2 cm black flat lesion with irregular borders     BP 133/70   Pulse 66   Temp 97.7 F (36.5 C)   Ht 6' (1.829 m)   Wt 161 lb (73 kg)   SpO2 97%   BMI 21.84 kg/m  Wt Readings from Last 3 Encounters:  04/02/21 161 lb (73 kg)  06/29/19 146 lb 12.8 oz (66.6 kg)  12/25/17 172 lb 12.8 oz (78.4 kg)    Health Maintenance Due  Topic Date Due   COVID-19 Vaccine (1) Never done   Hepatitis C Screening  Never done   INFLUENZA VACCINE  03/12/2021    There are no preventive care reminders to display for this patient.   Lab Results  Component Value Date   TSH 1.28 12/25/2017   Lab Results  Component Value Date   WBC 3.6 (L) 12/25/2017   HGB 16.0 12/25/2017   HCT 45.6 12/25/2017   MCV 94.3 12/25/2017   PLT 265.0 12/25/2017   Lab Results  Component Value Date   NA 141 12/25/2017   K 4.6 12/25/2017   CO2 33 (H) 12/25/2017   GLUCOSE 84 12/25/2017   BUN 14 12/25/2017   CREATININE 1.18 12/25/2017   BILITOT 0.7 12/25/2017   ALKPHOS 56 12/25/2017   AST 13 12/25/2017   ALT 9 12/25/2017   PROT 7.2 12/25/2017    ALBUMIN 4.8 12/25/2017   CALCIUM 9.9 12/25/2017   GFR 80.74 12/25/2017   Lab Results  Component Value Date   CHOL 167 10/07/2014   Lab Results  Component Value Date   HDL 51.20 10/07/2014   Lab Results  Component Value Date   LDLCALC 92 10/07/2014   Lab Results  Component Value Date   TRIG 121.0 10/07/2014   Lab Results  Component Value Date   CHOLHDL 3 10/07/2014   Lab Results  Component Value Date   HGBA1C 5.3 10/07/2014       Assessment & Plan:   Problem List Items Addressed This Visit   None   1. Neoplasm of uncertain behavior of skin -Referral to dermatology for removal of lesion on his right auricle -Asked him to schedule for shave biopsy in the office with me for the removal of the other 2 lesions once he is back from his beach vacation.    Adrea Sherpa M Sinead Hockman, PA-C

## 2021-04-09 ENCOUNTER — Encounter: Payer: Self-pay | Admitting: Physician Assistant

## 2021-04-09 ENCOUNTER — Ambulatory Visit (INDEPENDENT_AMBULATORY_CARE_PROVIDER_SITE_OTHER): Payer: Medicaid Other | Admitting: Physician Assistant

## 2021-04-09 ENCOUNTER — Other Ambulatory Visit: Payer: Self-pay

## 2021-04-09 VITALS — BP 123/65 | HR 82 | Temp 98.6°F | Ht 72.0 in | Wt 161.8 lb

## 2021-04-09 DIAGNOSIS — D485 Neoplasm of uncertain behavior of skin: Secondary | ICD-10-CM

## 2021-04-09 DIAGNOSIS — D225 Melanocytic nevi of trunk: Secondary | ICD-10-CM

## 2021-04-09 DIAGNOSIS — D2262 Melanocytic nevi of left upper limb, including shoulder: Secondary | ICD-10-CM

## 2021-04-09 NOTE — Progress Notes (Signed)
Established Patient Office Visit  Subjective:  Patient ID: Shawn Harper, male    DOB: 07/07/1996  Age: 25 y.o. MRN: [id number]  CC:  Chief Complaint  Patient presents with   Skin Tag    Removal    HPI BRAYANT SOBH presents for skin lesion removal x 2. No changes since last week.   Past Medical History:  Diagnosis Date   Smoker    5 pack years, trying to quit    Past Surgical History:  Procedure Laterality Date   LEG SURGERY     within a month of birth, in cast for infancy    Family History  Problem Relation Age of Onset   Hypertension Mother    Hyperlipidemia Mother    Depression Mother        after loss of son   Anxiety disorder Mother        after loss of son   Colon polyps Mother        at 11   Other Father        does not talk to dad   Suicidality Brother        83, states was ruled accident, family think suicide   Obesity Sister    Syncope episode Sister    COPD Maternal Grandmother    Heart disease Maternal Grandfather     Social History   Socioeconomic History   Marital status: Single    Spouse name: Not on file   Number of children: Not on file   Years of education: Not on file   Highest education level: Not on file  Occupational History   Not on file  Tobacco Use   Smoking status: Former    Packs/day: 0.25    Types: Cigarettes   Smokeless tobacco: Never  Vaping Use   Vaping Use: Never used  Substance and Sexual Activity   Alcohol use: Yes    Alcohol/week: 0.0 - 1.0 standard drinks    Comment: occasional beer    Drug use: Yes    Types: Marijuana    Comment: encouraged against use   Sexual activity: Yes    Partners: Female  Other Topics Concern   Not on file  Social History Narrative   Long term relationship- live together. 66 year old son Quillian Quince in 12/2017.       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   No facility-administered medications prior to visit.    Allergies  Allergen Reactions   Doxycycline Nausea And Vomiting    ROS Review of Systems REFER TO HPI FOR PERTINENT POSITIVES AND NEGATIVES    Objective:    Physical Exam Skin:    Comments: Left Forearm - 0.3 x 0.2 cm raised oval dark brown lesion  Left axillary chest wall - 0.2 cm x 0.2 cm raised dark brown lesion  Right posterior auricle - 0.2 cm x 0.2 cm black flat lesion with irregular borders  BP 123/65   Pulse 82   Temp 98.6 F (37 C) (Temporal)   Ht 6' (1.829 m)   Wt 161 lb 12.8 oz (73.4 kg)   SpO2 98%   BMI 21.94 kg/m  Wt Readings from Last 3 Encounters:  04/09/21 161 lb 12.8 oz (73.4 kg)  04/02/21 161 lb (73 kg)  06/29/19 146 lb 12.8 oz (66.6 kg)     Health Maintenance Due  Topic Date Due   COVID-19 Vaccine (1) Never done   Pneumococcal Vaccine 25-30 Years old (1 - PCV) Never done   Hepatitis C Screening  Never done   INFLUENZA VACCINE  03/12/2021    There are no preventive care reminders to display for this patient.  Lab Results  Component Value Date   TSH 1.28 12/25/2017   Lab Results  Component Value Date   WBC 3.6 (L) 12/25/2017   HGB 16.0 12/25/2017   HCT 45.6 12/25/2017   MCV 94.3 12/25/2017   PLT 265.0 12/25/2017   Lab Results  Component Value Date   NA 141 12/25/2017   K 4.6 12/25/2017   CO2 33 (H) 12/25/2017   GLUCOSE 84 12/25/2017   BUN 14 12/25/2017   CREATININE 1.18 12/25/2017   BILITOT 0.7 12/25/2017   ALKPHOS 56 12/25/2017   AST 13 12/25/2017   ALT 9 12/25/2017   PROT 7.2 12/25/2017   ALBUMIN 4.8 12/25/2017    CALCIUM 9.9 12/25/2017   GFR 80.74 12/25/2017   Lab Results  Component Value Date   CHOL 167 10/07/2014   Lab Results  Component Value Date   HDL 51.20 10/07/2014   Lab Results  Component Value Date   LDLCALC 92 10/07/2014   Lab Results  Component Value Date   TRIG 121.0 10/07/2014   Lab Results  Component Value Date   CHOLHDL 3 10/07/2014   Lab Results  Component Value Date   HGBA1C 5.3 10/07/2014      Assessment & Plan:   Problem List Items Addressed This Visit   None  1. Neoplasm of uncertain behavior of skin (x2) Procedure explained and consent obtained. Area cleaned in usual manner. Local anesthesia achieved with 3 cc of Lidocaine 1% with Epi, using a 25 gauge needle. Dermablade used to remove the lesions. Hemostasis with Drysol. Band-Aid with Vaseline applied. This procedure was performed for skin lesion of left forearm as well as left axillary chest wall lesion. Patient tolerated procedure well. Aftercare instructions provided. Lesions sent for pathology.   He already has derm referral for lesion behind his ear.   Follow-up: No follow-ups on file.    Shalen Petrak M Penelope Fittro, PA-C

## 2021-04-10 ENCOUNTER — Ambulatory Visit
Admission: RE | Admit: 2021-04-10 | Discharge: 2021-04-10 | Disposition: A | Discharge status: Home / Self Care | Payer: 59 | Source: Ambulatory Visit | Attending: Podiatry | Admitting: Podiatry

## 2021-04-10 DIAGNOSIS — Q749 Unspecified congenital malformation of limb(s): Secondary | ICD-10-CM

## 2021-04-10 DIAGNOSIS — M958 Other specified acquired deformities of musculoskeletal system: Secondary | ICD-10-CM

## 2021-04-10 DIAGNOSIS — M2170 Unequal limb length (acquired), unspecified site: Secondary | ICD-10-CM

## 2021-04-10 DIAGNOSIS — Q6652 Congenital pes planus, left foot: Secondary | ICD-10-CM

## 2021-04-10 DIAGNOSIS — Q6689 Other  specified congenital deformities of feet: Secondary | ICD-10-CM

## 2021-04-10 IMAGING — MR MR ANKLE*L* W/O CM
5 series · 36 of 40 positions shown · non-contrast
Comparison: None.

CLINICAL DATA: Congenital foot deformity. Patient presents for
follow-up.

EXAM:
MRI OF THE LEFT ANKLE WITHOUT CONTRAST
MRI OF THE LEFT FOOT WITHOUT CONTRAST
TECHNIQUE: Multiplanar, multisequence MR imaging of the ankle was performed. No
intravenous contrast was administered.
Multiplanar, multisequence MR imaging of the foot was performed. No

[Series 10: T2 fat-sat · axial · 3.0mm · 0.50mm/px · z∈[-84,+37]mm · 8 of 32 slices shown (1 of 2)]
[im 1/32]
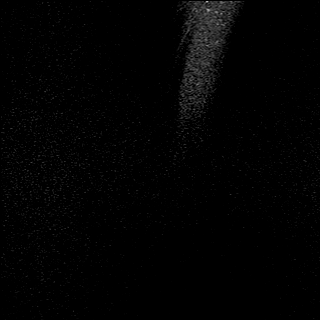
[im 4/32]
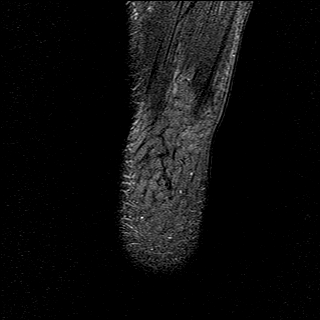
[im 11/32]
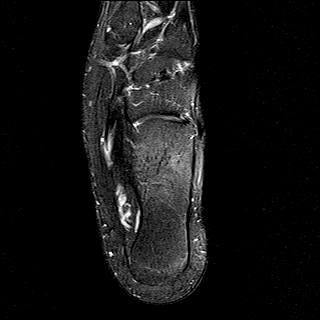
[im 14/32]
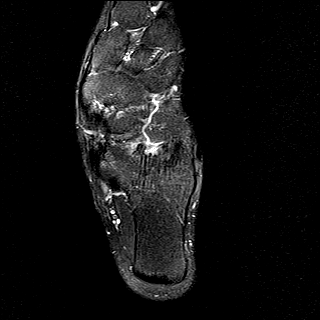
[im 18/32]
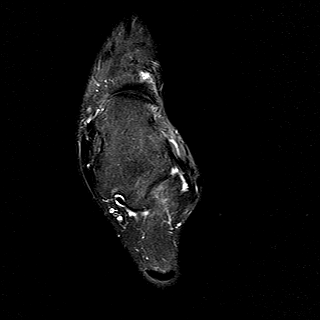
[im 21/32]
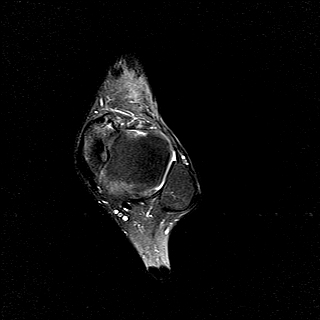
[im 28/32]
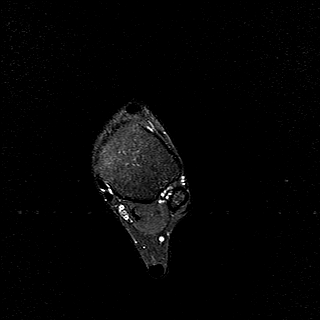
[im 32/32]
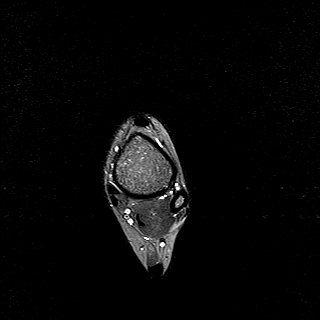

[Series 11: PD fat-sat · axial · 3.0mm · 0.42mm/px · z∈[-84,+37]mm · 10 of 32 slices shown]
[im 1/32]
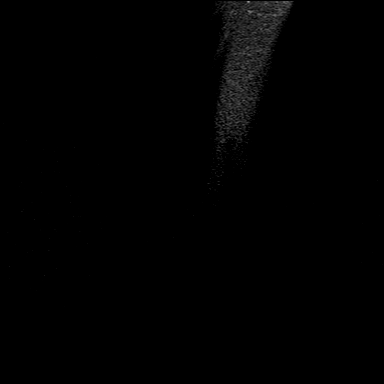
[im 4/32]
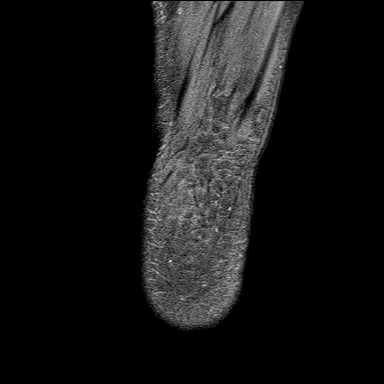
[im 7/32]
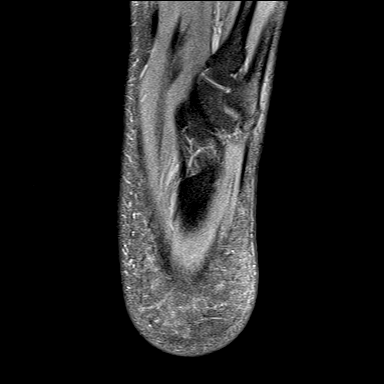
[im 11/32]
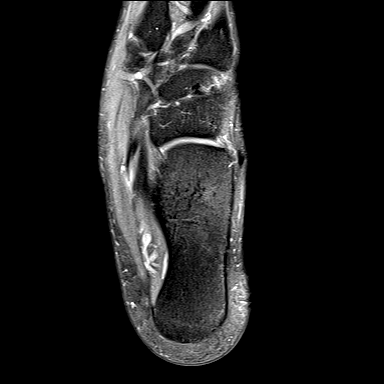
[im 14/32]
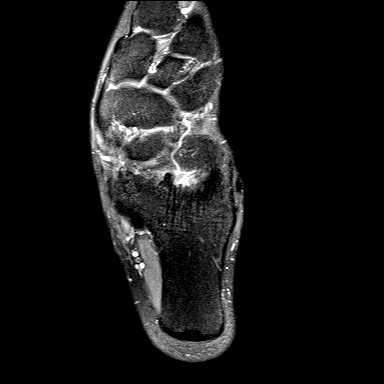
[im 18/32]
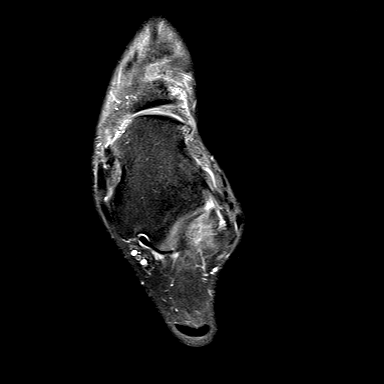
[im 21/32]
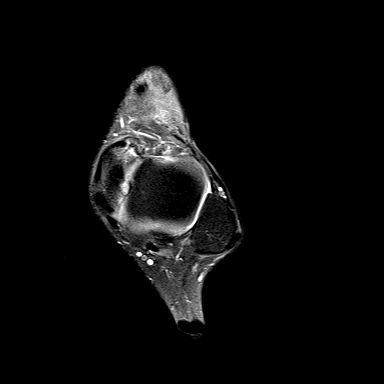
[im 25/32]
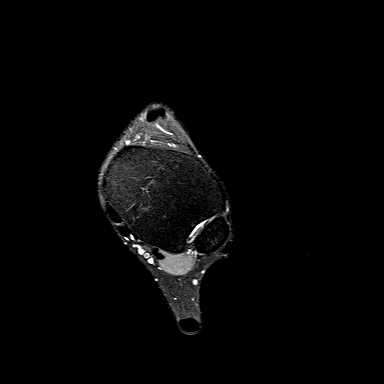
[im 28/32]
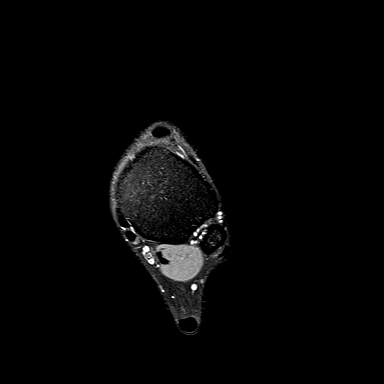
[im 32/32]
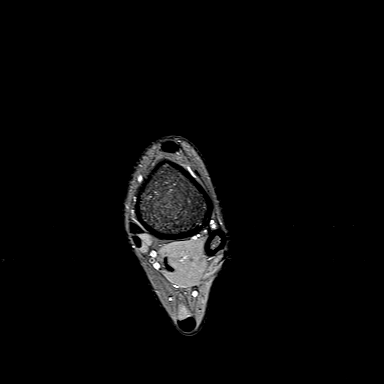

[Series 12: T1 · sagittal · 4.0mm · 0.56mm/px · 5 of 18 slices shown]
[im 1/18]
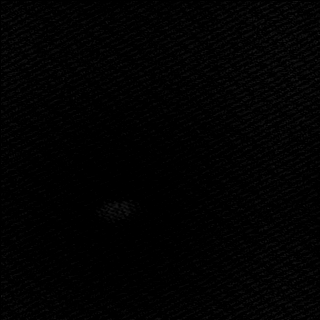
[im 5/18]
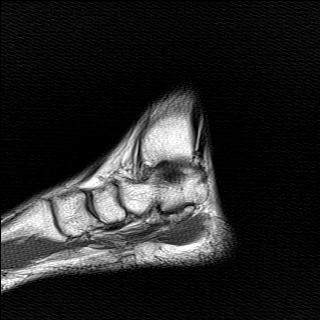
[im 9/18]
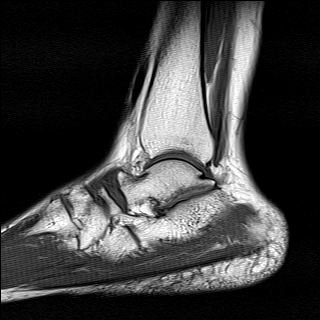
[im 13/18]
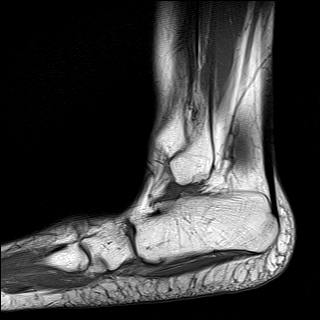
[im 18/18]
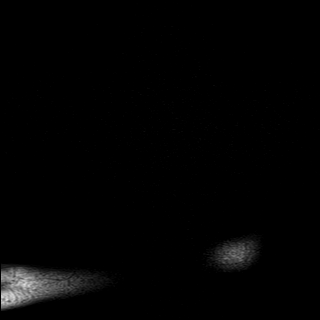

[Series 13: STIR · sagittal · 4.0mm · 0.35mm/px · 5 of 18 slices shown]
[im 1/18]
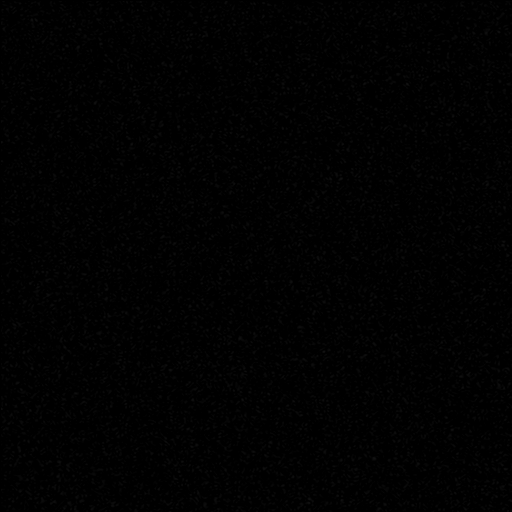
[im 5/18]
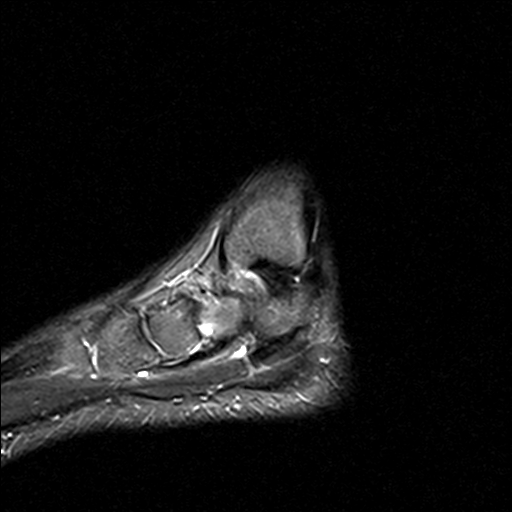
[im 9/18]
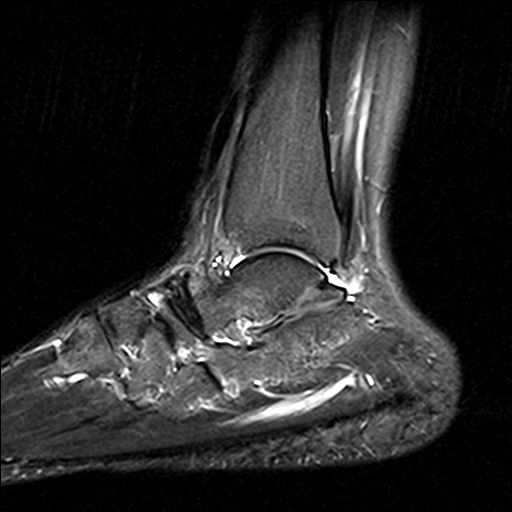
[im 13/18]
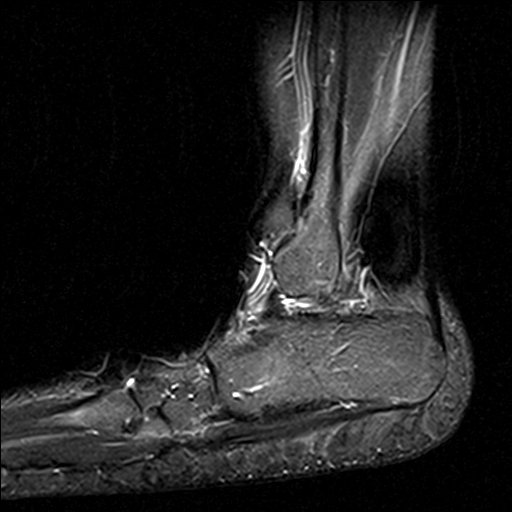
[im 18/18]
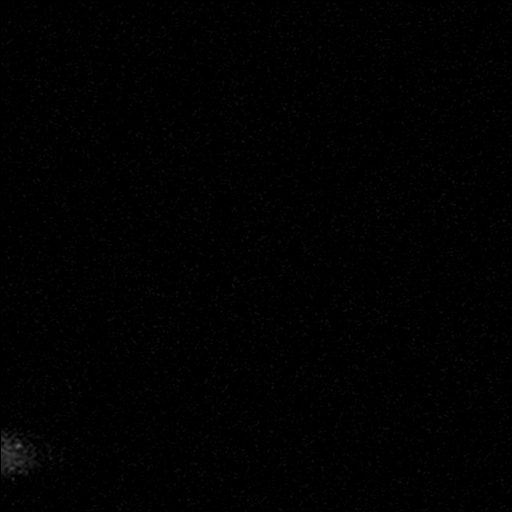

[Series 14: T2 fat-sat · coronal · 3.0mm · 0.50mm/px · 8 of 32 slices shown (2 of 2)]
[im 1/32]
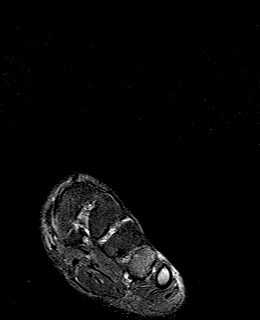
[im 4/32]
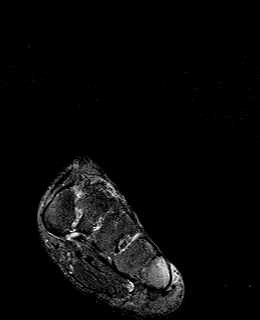
[im 11/32]
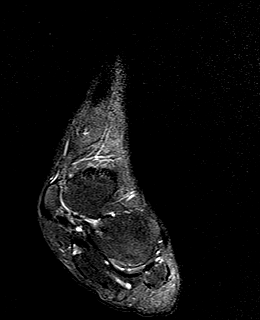
[im 14/32]
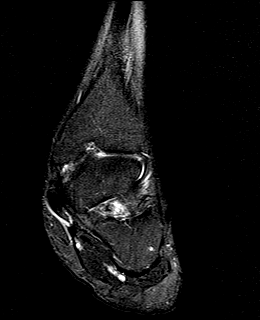
[im 18/32]
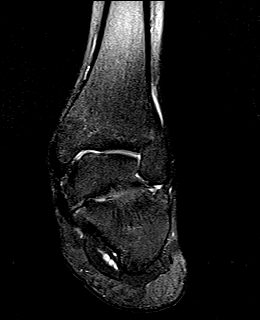
[im 21/32]
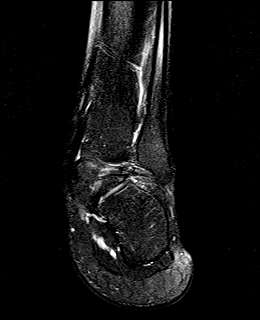
[im 28/32]
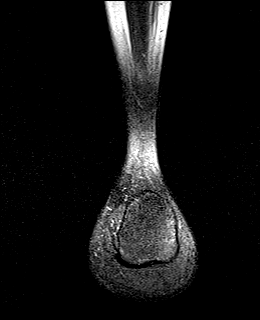
[im 32/32]
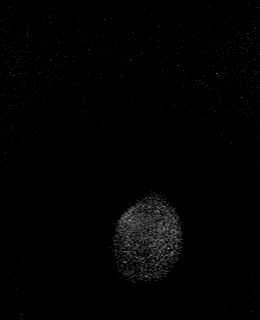

[36 of 40 positions shown; findings below may reference images not displayed]

FINDINGS: TENDONS

Peroneal: Peroneal longus tendon intact. Peroneal brevis intact.

Posteromedial: Posterior tibial tendon intact. Flexor hallucis
longus tendon intact. Flexor digitorum longus tendon intact.

Anterior: Tibialis anterior tendon intact. Extensor hallucis longus
tendon intact Extensor digitorum longus tendon intact.

Achilles:  Intact.

Plantar Fascia: Intact.

LIGAMENTS

Lateral: Anterior talofibular ligament intact. Calcaneofibular
ligament intact. Posterior talofibular ligament intact. Anterior and
posterior tibiofibular ligaments intact.

Medial: Deltoid ligament intact. Spring ligament intact.

Lisfranc: Intact.

Collaterals: Intact

CARTILAGE

Ankle Joint: No joint effusion. Normal ankle mortise. No chondral
defect.

Subtalar Joints/Sinus Tarsi: No subtalar joint effusion. Normal
sinus tarsi. Irregularity on either side of the middle subtalar
joint most concerning for fibro-cartilaginous talocalcaneal
coalition.

Bones: Mild marrow edema in the first metatarsal neck likely
reflecting mild stress reaction. No acute fracture or dislocation.
Relative pes planus.

Soft Tissue: No fluid collection or hematoma. Muscles are normal
without edema or atrophy. Tarsal tunnel is normal.
IMPRESSION: 1. Irregularity on either side of the middle subtalar joint most
concerning for fibro-cartilaginous talocalcaneal coalition.
2. Relative pes planus.
3. Mild marrow edema in the first metatarsal neck likely reflecting
mild stress reaction.

## 2021-04-10 IMAGING — MR MR FOOT*L* W/O CM
4 of 6 series · 19 of 40 positions shown · non-contrast
Comparison: None.

CLINICAL DATA: Congenital foot deformity. Patient presents for
follow-up.

EXAM:
MRI OF THE LEFT ANKLE WITHOUT CONTRAST
MRI OF THE LEFT FOOT WITHOUT CONTRAST
TECHNIQUE: Multiplanar, multisequence MR imaging of the ankle was performed. No
intravenous contrast was administered.
Multiplanar, multisequence MR imaging of the foot was performed. No

[Series 8: T1 · axial · 3.0mm · 0.19mm/px · z∈[-123,-15]mm · 4 of 44 slices shown (1 of 2)]
[im 1/44]
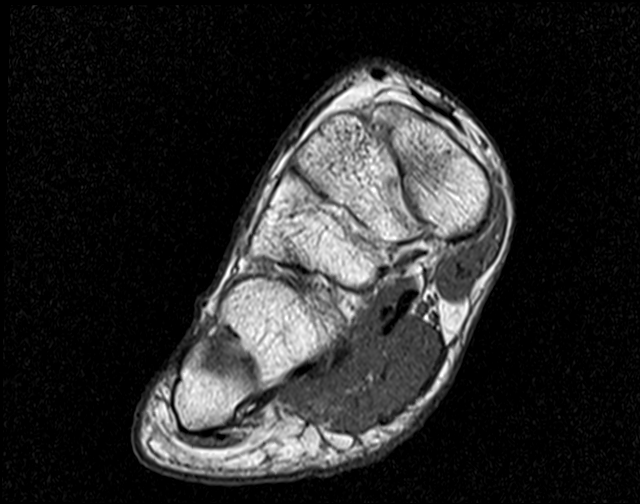
[im 5/44]
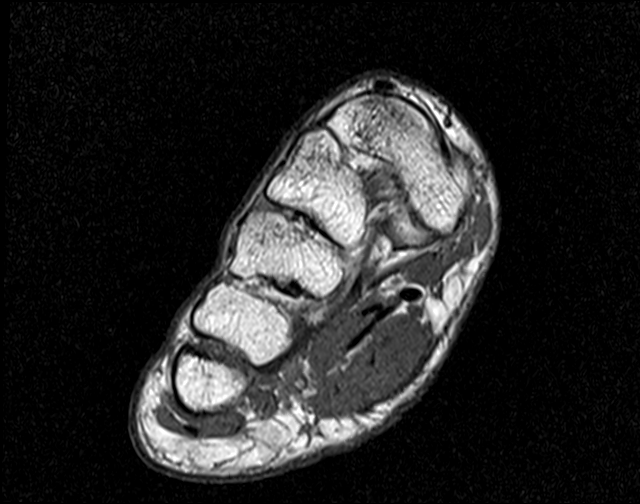
[im 24/44]
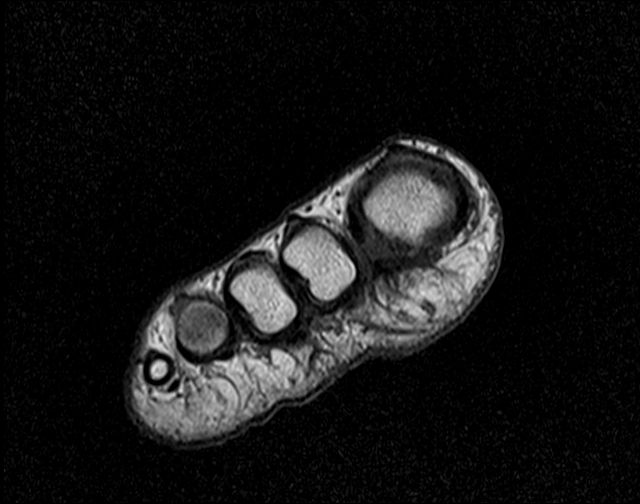
[im 39/44]
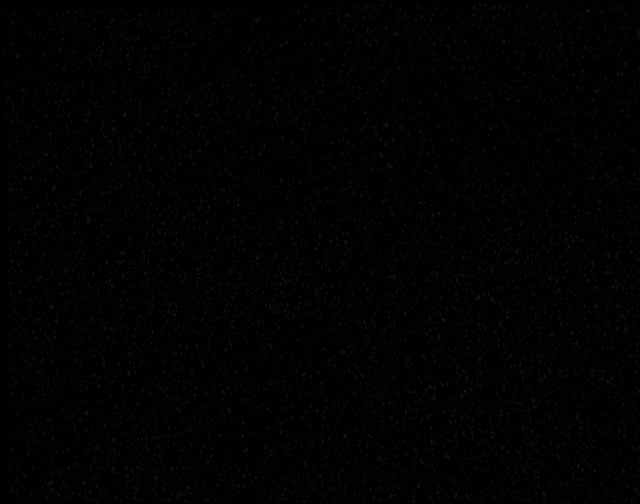

[Series 9: T2 fat-sat · axial · 3.0mm · 0.19mm/px · z∈[-123,-0]mm · 8 of 44 slices shown (1 of 2)]
[im 1/44]
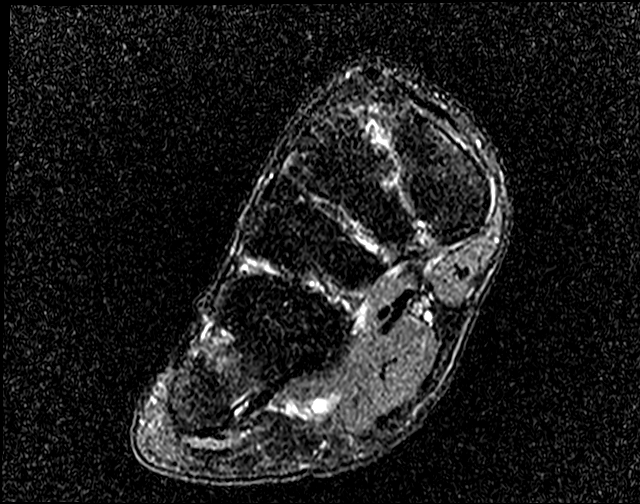
[im 5/44]
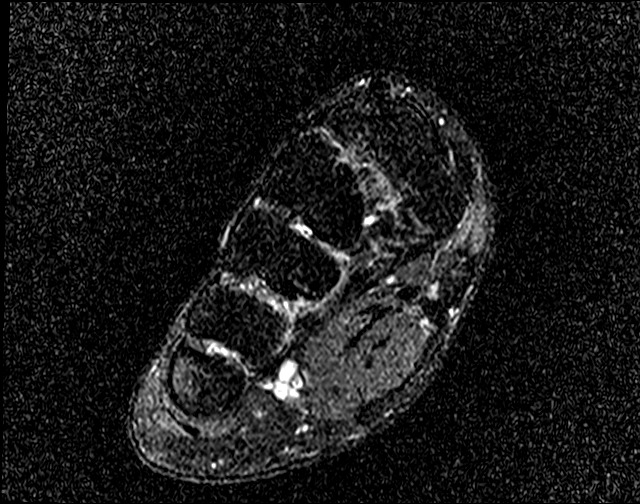
[im 15/44]
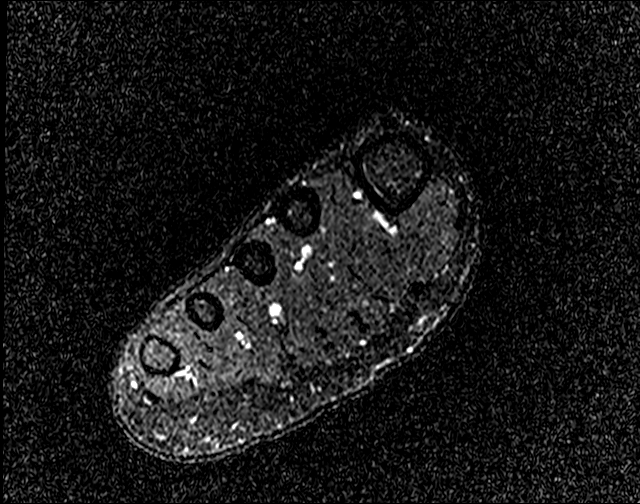
[im 20/44]
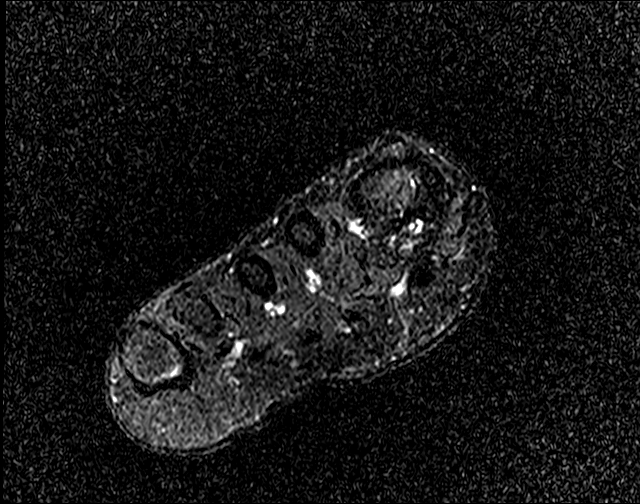
[im 24/44]
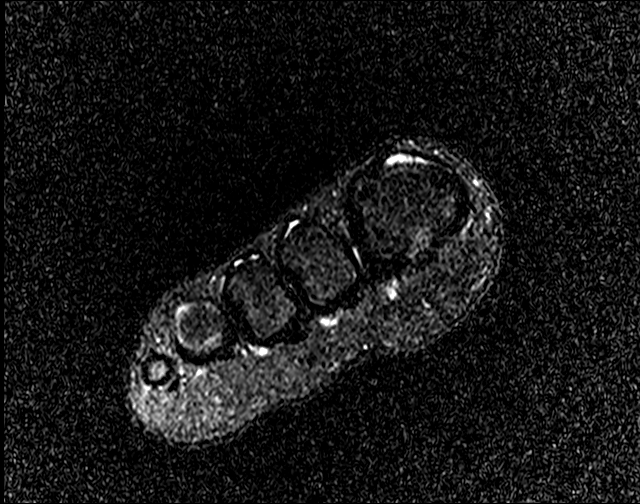
[im 29/44]
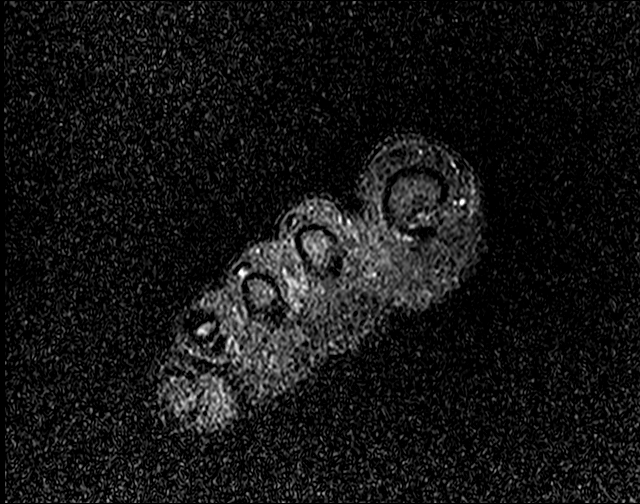
[im 39/44]
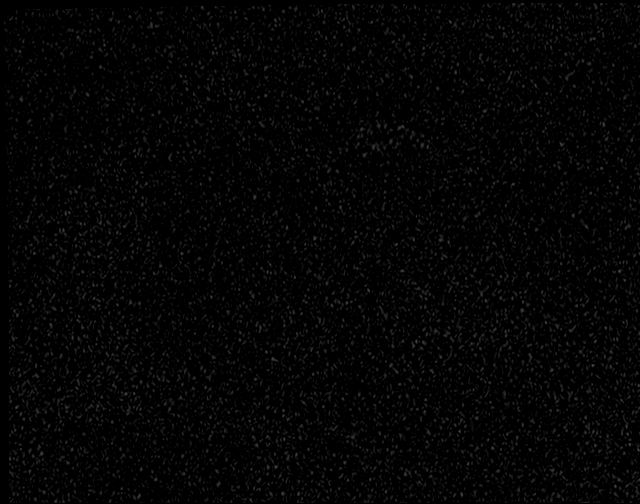
[im 44/44]
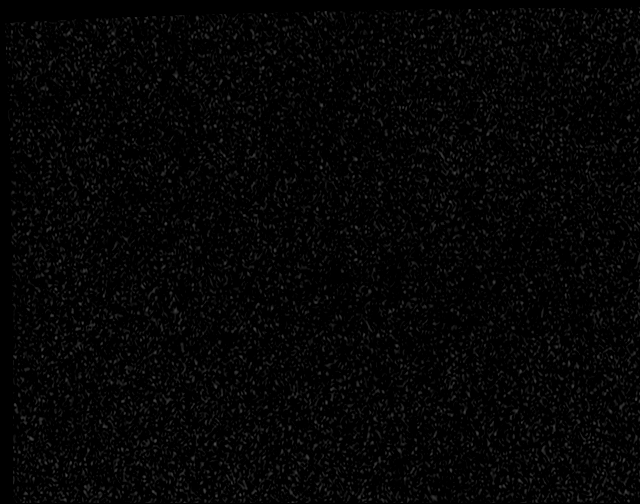

[Series 12: T2 fat-sat · oblique · 3.0mm · 0.35mm/px · 4 of 20 slices shown (2 of 2)]
[im 1/20]
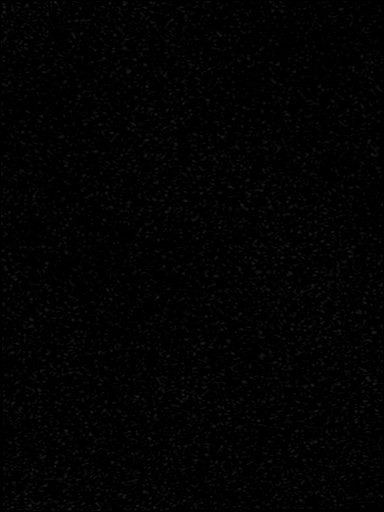
[im 7/20]
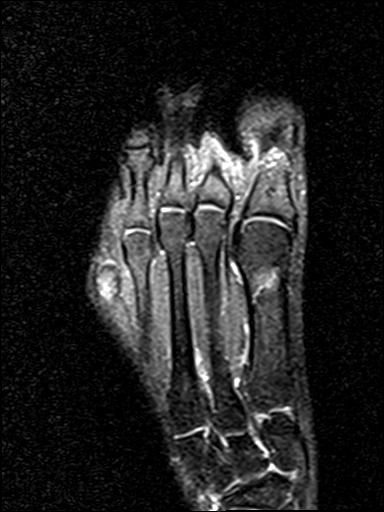
[im 13/20]
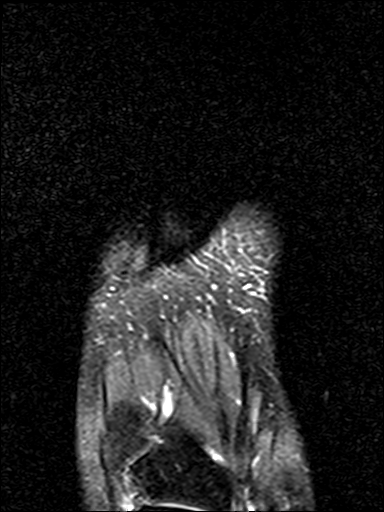
[im 20/20]
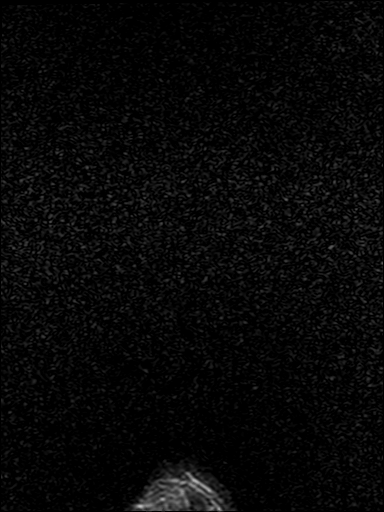

[Series 13: T1 · oblique · 3.0mm · 0.35mm/px · 3 of 20 slices shown (2 of 2)]
[im 1/20]
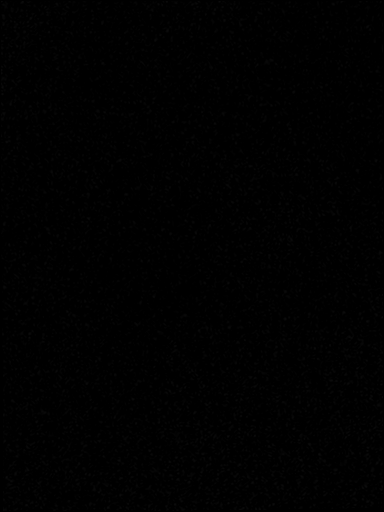
[im 13/20]
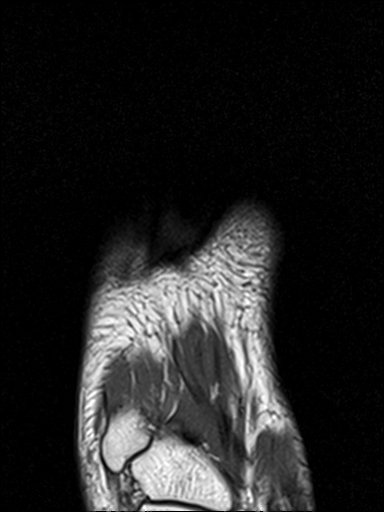
[im 20/20]
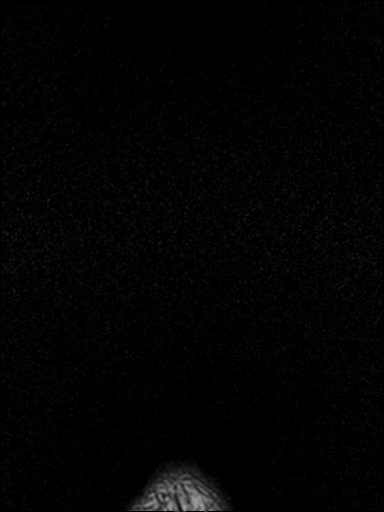

[19 of 40 positions shown; findings below may reference images not displayed]

FINDINGS: TENDONS

Peroneal: Peroneal longus tendon intact. Peroneal brevis intact.

Posteromedial: Posterior tibial tendon intact. Flexor hallucis
longus tendon intact. Flexor digitorum longus tendon intact.

Anterior: Tibialis anterior tendon intact. Extensor hallucis longus
tendon intact Extensor digitorum longus tendon intact.

Achilles:  Intact.

Plantar Fascia: Intact.

LIGAMENTS

Lateral: Anterior talofibular ligament intact. Calcaneofibular
ligament intact. Posterior talofibular ligament intact. Anterior and
posterior tibiofibular ligaments intact.

Medial: Deltoid ligament intact. Spring ligament intact.

Lisfranc: Intact.

Collaterals: Intact

CARTILAGE

Ankle Joint: No joint effusion. Normal ankle mortise. No chondral
defect.

Subtalar Joints/Sinus Tarsi: No subtalar joint effusion. Normal
sinus tarsi. Irregularity on either side of the middle subtalar
joint most concerning for fibro-cartilaginous talocalcaneal
coalition.

Bones: Mild marrow edema in the first metatarsal neck likely
reflecting mild stress reaction. No acute fracture or dislocation.
Relative pes planus.

Soft Tissue: No fluid collection or hematoma. Muscles are normal
without edema or atrophy. Tarsal tunnel is normal.
IMPRESSION: 1. Irregularity on either side of the middle subtalar joint most
concerning for fibro-cartilaginous talocalcaneal coalition.
2. Relative pes planus.
3. Mild marrow edema in the first metatarsal neck likely reflecting
mild stress reaction.

## 2021-04-17 LAB — ANATOMIC PATHOLOGY REPORT

## 2021-04-25 ENCOUNTER — Telehealth: Payer: Self-pay | Admitting: Podiatry

## 2021-04-25 NOTE — Telephone Encounter (Signed)
Patient called the office wanting to know the results of his MRI.   Please advise

## 2021-04-30 NOTE — Telephone Encounter (Signed)
I'll let Dr.McDonald contact the patient when he returns and discussed the MRI findings with him.-Dr. Amalia Hailey

## 2021-05-02 NOTE — Telephone Encounter (Signed)
I Called the patient back and got him scheduled for 05/07/21 for his MRI results.

## 2021-05-07 ENCOUNTER — Ambulatory Visit (INDEPENDENT_AMBULATORY_CARE_PROVIDER_SITE_OTHER): Payer: Medicaid Other | Admitting: Podiatry

## 2021-05-07 ENCOUNTER — Other Ambulatory Visit: Payer: Self-pay

## 2021-05-07 DIAGNOSIS — Q6652 Congenital pes planus, left foot: Secondary | ICD-10-CM | POA: Diagnosis not present

## 2021-05-07 DIAGNOSIS — M2170 Unequal limb length (acquired), unspecified site: Secondary | ICD-10-CM | POA: Diagnosis not present

## 2021-05-07 DIAGNOSIS — Q6689 Other  specified congenital deformities of feet: Secondary | ICD-10-CM

## 2021-05-07 MED ORDER — MELOXICAM 15 MG PO TABS: 15.0000 mg | ORAL_TABLET | Freq: Every day | ORAL | 3 refills | Status: DC

## 2021-05-11 NOTE — Progress Notes (Signed)
Subjective:  Patient ID: Shawn Harper, male    DOB: [birth year],  MRN: [id number]  Chief Complaint  Patient presents with   Flat Foot      MRI results     25 y.o. male presents with the above complaint. History confirmed with patient.  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  Has lived with his all his life but is becoming increasingly painful.  It is affecting his ability to work and take care of his son.  Pain is mostly in the medial ankle.  His foot and toes are difficult to move.  Describes it as a dull throbbing aching and shooting pain.  He says his left leg is shorter than his right and this is affecting his hips knees and back.    Since last visit he completed the MRI and still very painful for him   Objective:  Physical Exam: warm, good capillary refill, no trophic changes or ulcerative lesions, normal DP and PT pulses, and normal sensory exam. Left Foot: Rigid pes planovalgus deformity, his left lower extremity is shorter than the right, femoral and tibial segments appear to be of moderately equal length the discrepancy appears to be within the foot itself, he has difficulty with range of motion of the toes actively and passively       Radiographs: Multiple views x-ray of the left foot and ankle were taken he has severe pes planovalgus, likely subtalar coalition, flattened navicular bone with collapse medially, the ankle joint has a likely kissing osteochondral lesion of the medial tibia and talus appears to have middle facet coalition on calcaneal views  Study Result  Narrative & Impression  CLINICAL DATA:  Congenital foot deformity. Patient presents for follow-up.   EXAM: MRI OF THE LEFT ANKLE WITHOUT CONTRAST   MRI OF THE LEFT FOOT WITHOUT CONTRAST   TECHNIQUE: Multiplanar, multisequence MR imaging of the ankle was performed. No intravenous contrast was  administered.   Multiplanar, multisequence MR imaging of the foot was performed. No intravenous contrast was administered.   COMPARISON:  None.   FINDINGS: TENDONS   Peroneal: Peroneal longus tendon intact. Peroneal brevis intact.   Posteromedial: Posterior tibial tendon intact. Flexor hallucis longus tendon intact. Flexor digitorum longus tendon intact.   Anterior: Tibialis anterior tendon intact. Extensor hallucis longus tendon intact Extensor digitorum longus tendon intact.   Achilles:  Intact.   Plantar Fascia: Intact.   LIGAMENTS   Lateral: Anterior talofibular ligament intact. Calcaneofibular ligament intact. Posterior talofibular ligament intact. Anterior and posterior tibiofibular ligaments intact.   Medial: Deltoid ligament intact. Spring ligament intact.   Lisfranc: Intact.   Collaterals: Intact   CARTILAGE   Ankle Joint: No joint effusion. Normal ankle mortise. No chondral defect.   Subtalar Joints/Sinus Tarsi: No subtalar joint effusion. Normal sinus tarsi. Irregularity on either side of the middle subtalar joint most concerning for fibro-cartilaginous talocalcaneal coalition.   Bones: Mild marrow edema in the first metatarsal neck likely reflecting mild stress reaction. No acute fracture or dislocation. Relative pes planus.   Soft Tissue: No fluid collection or hematoma. Muscles are normal without edema or atrophy. Tarsal tunnel is normal.   IMPRESSION: 1. Irregularity on either side of the middle subtalar joint most concerning for fibro-cartilaginous talocalcaneal coalition. 2. Relative pes planus. 3. Mild marrow edema in the first metatarsal neck likely reflecting mild stress reaction.  Electronically Signed   By: Kathreen Devoid M.D.   On: 04/11/2021 09:42   Assessment:   1. Pes planus, rigid, left   2. Coalition, talocalcaneal   3. Acquired unequal limb length      Plan:  Patient was evaluated and treated and all questions  answered.  Reviewed his MRI results and discussed what his nonsurgical and surgical options are.  Nonsurgical we discussed bracing with a custom brace and/or UCBL type orthosis.  I wrote him a referral to [facility] and a prescription for this to be made.  Surgical we discussed a double arthrodesis.  I think osteotomies would be sufficient or resection of the coalition as he already shows degenerative and advanced arthritic changes of the subtalar and talonavicular joint.  Double arthrodesis would likely give him a good chance to correct his deformity as well as his limb length discrepancy.  He will follow with me after the brace has been made and he is available try this to see if it is a possible solution.  I also prescribed him meloxicam for anti-inflammatory relief  Return for follow up after brace is made .

## 2021-09-10 NOTE — Progress Notes (Incomplete)
Phone: [phone]    Subjective:  Patient presents today for their annual physical. Chief complaint-noted.   See problem oriented charting- ROS- full  review of systems was completed and negative  except for: ***  The following were reviewed and entered/updated in epic: Past Medical History:  Diagnosis Date   Smoker    5 pack years, trying to quit   Patient Active Problem List   Diagnosis Date Noted   Unprotected sex 10/07/2014   GAD (generalized anxiety disorder) 10/07/2014   Depression 10/07/2014   Smoker    Past Surgical History:  Procedure Laterality Date   LEG SURGERY     within a month of birth, in cast for infancy    Family History  Problem Relation Age of Onset   Hypertension Mother    Hyperlipidemia Mother    Depression Mother        after loss of son   Anxiety disorder Mother        after loss of son   Colon polyps Mother        at 54   Other Father        does not talk to dad   Suicidality Brother        34, states was ruled accident, family think suicide   Obesity Sister    Syncope episode Sister    COPD Maternal Grandmother    Heart disease Maternal Grandfather     Medications- reviewed and updated Current Outpatient Medications  Medication Sig Dispense Refill   meloxicam (MOBIC) 15 MG tablet Take 1 tablet (15 mg total) by mouth daily. 30 tablet 3   No current facility-administered medications for this visit.    Allergies-reviewed and updated Allergies  Allergen Reactions   Doxycycline Nausea And Vomiting    Social History   Social History Narrative   Long term relationship- live together. 43 year old son Quillian Quince in 12/2017.       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    HS graduate      Hobbies: time with girlfriend and son, netflix      Objective:  There were no vitals taken for this visit. Gen: NAD, resting comfortably HEENT: Mucous  membranes are moist. Oropharynx normal Neck: no thyromegaly CV: RRR no murmurs rubs or gallops Lungs: CTAB no crackles, wheeze, rhonchi Abdomen: soft/nontender/nondistended/normal bowel sounds. No rebound or guarding.  Ext: no edema Skin: warm, dry Neuro: grossly normal, moves all extremities, PERRLA ***    Assessment and Plan:  26 y.o. male presenting for annual physical.  Health Maintenance counseling: 1. Anticipatory guidance: Patient counseled regarding regular dental exams ***q6 months, eye exams ***,  avoiding smoking and second hand smoke*** , limiting alcohol to 2 beverages per day***, no illicit drugs***.   2. Risk factor reduction:  Advised patient of need for regular exercise and diet rich and fruits and vegetables to reduce risk of heart attack and stroke.  Exercise- ***.  Diet/weight management-***.  Wt Readings from Last 3 Encounters:  04/09/21 161 lb 12.8 oz (73.4 kg)  04/02/21 161 lb (73 kg)  06/29/19 146 lb 12.8 oz (66.6 kg)   3. Immunizations/screenings/ancillary studies DISCUSSED:  -COVID booster vaccination #1- *** -Flu vaccination (last one 06/2019) - *** -Hepatitis C Screening - *** Immunization History  Administered Date(s) Administered   Influenza,inj,Quad PF,6+ Mos 06/29/2019   Tdap 12/25/2017   Health Maintenance Due  Topic  Date Due   COVID-19 Vaccine (1) Never done   Hepatitis C Screening  Never done   INFLUENZA VACCINE  03/12/2021    Family History  Problem Relation Age of Onset   Hypertension Mother    Hyperlipidemia Mother    Depression Mother        after loss of son   Anxiety disorder Mother        after loss of son   Colon polyps Mother        at 51   Other Father        does not talk to dad   Suicidality Brother        37, states was ruled accident, family think suicide   Obesity Sister    Syncope episode Sister    COPD Maternal Grandmother    Heart disease Maternal Grandfather    4. Prostate cancer screening- ***  5. Colon  cancer screening - *** 6. Skin cancer screening/prevention- ***advised regular sunscreen use. Denies worrisome, changing, or new skin lesions.  7. Testicular cancer screening- advised monthly self exams *** 8. STD screening- patient opts *** 9. Smoking associated screening- *** smoker- ***  Status of chronic or acute concerns   # social update- Staying home with son.*** Had been at [employer] then merged with [employer] but customers were not taking appropriate safety precautions putting his family at [employer]. 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    # has not smoked cigarette in years- used e cigarettes- advised cessatoin   Recommended follow up: No follow-ups on file. Future Appointments  Date Time Provider [place]  09/14/2021  1:40 PM Marin Olp, MD [facility]    No chief complaint on file.  Lab/Order associations:*** fasting No diagnosis found.  No orders of the defined types were placed in this encounter.  I,Jada Bradford,acting as a scribe for Garret Reddish, MD.,have documented all relevant documentation on the behalf of Garret Reddish, MD,as directed by  Garret Reddish, MD while in the presence of Garret Reddish, MD.  ***  Return precautions advised.   Burnett Corrente

## 2021-09-14 ENCOUNTER — Encounter: Payer: Self-pay | Admitting: Family Medicine

## 2021-10-02 ENCOUNTER — Ambulatory Visit (INDEPENDENT_AMBULATORY_CARE_PROVIDER_SITE_OTHER): Payer: Medicaid Other | Admitting: Podiatry

## 2021-10-02 ENCOUNTER — Other Ambulatory Visit: Payer: Self-pay

## 2021-10-02 DIAGNOSIS — M2170 Unequal limb length (acquired), unspecified site: Secondary | ICD-10-CM

## 2021-10-02 DIAGNOSIS — Q6652 Congenital pes planus, left foot: Secondary | ICD-10-CM | POA: Diagnosis not present

## 2021-10-02 DIAGNOSIS — R2689 Other abnormalities of gait and mobility: Secondary | ICD-10-CM

## 2021-10-02 DIAGNOSIS — Q6689 Other  specified congenital deformities of feet: Secondary | ICD-10-CM

## 2021-10-02 NOTE — Progress Notes (Signed)
Subjective:  Patient ID: Shawn Harper, male    DOB: May 17, 1996,  MRN: [id number]  Chief Complaint  Patient presents with   Flat Foot    Follow up left foot, discuss next steps    26 y.o. male presents with the above complaint. History confirmed with patient.  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  Has lived with his all his life but is becoming increasingly painful.  It is affecting his ability to work and take care of his son.  Pain is mostly in the medial ankle.  His foot and toes are difficult to move.  Describes it as a dull throbbing aching and shooting pain.  He says his left leg is shorter than his right and this is affecting his hips knees and back.    Interval history: Since last visit he did obtain the AFO brace from [facility].  He try to wear it is much as he could but it did not offer him pain relief.  His pain is severe and still quite bothersome for him.  He is interested in further correction or options at this point.  Objective:  Physical Exam: warm, good capillary refill, no trophic changes or ulcerative lesions, normal DP and PT pulses, and normal sensory exam. Left Foot: Rigid pes planovalgus deformity, his left lower extremity is shorter than the right, femoral and tibial segments appear to be of moderately equal length the discrepancy appears to be within the foot itself, he has difficulty with range of motion of the toes actively and passively       Radiographs: Multiple views x-ray of the left foot and ankle were taken he has severe pes planovalgus, likely subtalar coalition, flattened navicular bone with collapse medially, the ankle joint has a likely kissing osteochondral lesion of the medial tibia and talus appears to have middle facet coalition on calcaneal views  Study Result  Narrative & Impression  CLINICAL DATA:  Congenital foot deformity. Patient  presents for follow-up.   EXAM: MRI OF THE LEFT ANKLE WITHOUT CONTRAST   MRI OF THE LEFT FOOT WITHOUT CONTRAST   TECHNIQUE: Multiplanar, multisequence MR imaging of the ankle was performed. No intravenous contrast was administered.   Multiplanar, multisequence MR imaging of the foot was performed. No intravenous contrast was administered.   COMPARISON:  None.   FINDINGS: TENDONS   Peroneal: Peroneal longus tendon intact. Peroneal brevis intact.   Posteromedial: Posterior tibial tendon intact. Flexor hallucis longus tendon intact. Flexor digitorum longus tendon intact.   Anterior: Tibialis anterior tendon intact. Extensor hallucis longus tendon intact Extensor digitorum longus tendon intact.   Achilles:  Intact.   Plantar Fascia: Intact.   LIGAMENTS   Lateral: Anterior talofibular ligament intact. Calcaneofibular ligament intact. Posterior talofibular ligament intact. Anterior and posterior tibiofibular ligaments intact.   Medial: Deltoid ligament intact. Spring ligament intact.   Lisfranc: Intact.   Collaterals: Intact   CARTILAGE   Ankle Joint: No joint effusion. Normal ankle mortise. No chondral defect.   Subtalar Joints/Sinus Tarsi: No subtalar joint effusion. Normal sinus tarsi. Irregularity on either side of the middle subtalar joint most concerning for fibro-cartilaginous talocalcaneal coalition.   Bones: Mild marrow edema in the first metatarsal neck likely reflecting mild stress reaction. No acute fracture or dislocation. Relative pes planus.   Soft Tissue: No fluid collection or hematoma. Muscles are normal without edema or atrophy.  Tarsal tunnel is normal.   IMPRESSION: 1. Irregularity on either side of the middle subtalar joint most concerning for fibro-cartilaginous talocalcaneal coalition. 2. Relative pes planus. 3. Mild marrow edema in the first metatarsal neck likely reflecting mild stress reaction.     Electronically Signed   By:  Kathreen Devoid M.D.   On: 04/11/2021 09:42   Assessment:   1. Pes planus, rigid, left   2. Coalition, talocalcaneal   3. Acquired unequal limb length   4. Inability to bear weight       Plan:  Patient was evaluated and treated and all questions answered.  Today we again discussed his diagnosis and treatment options.  Discussed with him and his significant other today and reviewed his radiographs once more.  Bracing has not been helpful so far.  Injection therapy could offer some anti-inflammatory relief temporarily but I expect that it will not last long considering the severity of the deformity and the coalition.  He is interested in surgical correction.  Surgically we discussed subtalar and talonavicular arthrodesis to correct his deformity with bone graft and bone marrow aspirate from the tibia as well as cadaveric allograft as well as possible tendo Achilles lengthening.  Also discussed the possibility of needing a Cotton osteotomy with allograft cadaver wedge.  We discussed his toe and forefoot deformities as well and I discussed with him that I would recommend correcting his hindfoot deformity first and some of these deformities may resolve with improved correction of his hindfoot.  Any residual deformities can be corrected at a later date in a staged procedure.  We discussed the risk benefits and potential complications including but not limited to pain, swelling, infection, scar, numbness which may be temporary or permanent, chronic pain, stiffness, nerve pain or damage, wound healing problems, bone healing problems including delayed or non-union.  All questions were addressed.  We discussed the postoperative course.  No guarantees as to the outcome of surgery were given.  He understands and wishes to proceed.  Informed consent was signed and reviewed surgery will be scheduled at a mutually agreeable date    Surgical plan:  Procedure: -Left foot double arthrodesis with autograft from  tibia, possible Cotton osteotomy, possible tendo Achilles lengthening  Location: -[facility]  Anesthesia plan: -General anesthesia with regional block  Postoperative pain plan: - Tylenol 1000 mg every 6 hours, ibuprofen 600 mg every 6 hours, gabapentin 300 mg every 8 hours x5 days, oxycodone 5 mg 1-2 tabs every 6 hours only as needed  DVT prophylaxis: -ASA 325 mg twice daily  WB Restrictions / DME needs: -NWB in splint or cast postop    No follow-ups on file.

## 2021-10-09 ENCOUNTER — Telehealth: Payer: Self-pay

## 2021-10-09 NOTE — Telephone Encounter (Signed)
Received an email from [place] with [facility]. They are not able to accommodate pre op PT for [facility].  Dr. Sherryle Lis added a referral to [facility] physical therapy. I left a message for [place] to contact them to set up his appointments.

## 2021-12-19 ENCOUNTER — Other Ambulatory Visit: Payer: 59

## 2021-12-24 ENCOUNTER — Other Ambulatory Visit
Admission: RE | Admit: 2021-12-24 | Discharge: 2021-12-24 | Disposition: A | Discharge status: Home / Self Care | Payer: 59 | Source: Ambulatory Visit | Attending: Family Medicine | Admitting: Family Medicine

## 2021-12-28 ENCOUNTER — Ambulatory Visit: Admit: 2021-12-28 | Payer: 59 | Admitting: Podiatry

## 2021-12-28 SURGERY — FLAT FOOT RECONSTRUCTION-TAL GASTROC RECESSION
Anesthesia: General | Laterality: Left

## 2021-12-31 ENCOUNTER — Telehealth: Payer: Self-pay | Admitting: Podiatry

## 2021-12-31 NOTE — Telephone Encounter (Signed)
Patient significant other needs to take time off from work, to help him. She said about 2 weeks, will that be ok. Her name is Karan Ramnauth.

## 2022-01-03 ENCOUNTER — Encounter: Payer: Medicaid Other | Admitting: Podiatry

## 2022-01-03 ENCOUNTER — Ambulatory Visit (INDEPENDENT_AMBULATORY_CARE_PROVIDER_SITE_OTHER): Payer: Medicaid Other | Admitting: Family Medicine

## 2022-01-03 ENCOUNTER — Encounter: Payer: Self-pay | Admitting: Family Medicine

## 2022-01-03 ENCOUNTER — Encounter: Payer: 59 | Admitting: Family Medicine

## 2022-01-03 VITALS — BP 120/80 | HR 51 | Temp 98.2°F | Ht 72.0 in | Wt 161.2 lb

## 2022-01-03 DIAGNOSIS — F32A Depression, unspecified: Secondary | ICD-10-CM | POA: Diagnosis not present

## 2022-01-03 DIAGNOSIS — Z1159 Encounter for screening for other viral diseases: Secondary | ICD-10-CM | POA: Diagnosis not present

## 2022-01-03 DIAGNOSIS — Z0000 Encounter for general adult medical examination without abnormal findings: Secondary | ICD-10-CM | POA: Diagnosis not present

## 2022-01-03 DIAGNOSIS — E785 Hyperlipidemia, unspecified: Secondary | ICD-10-CM

## 2022-01-03 DIAGNOSIS — F411 Generalized anxiety disorder: Secondary | ICD-10-CM

## 2022-01-03 DIAGNOSIS — Z87891 Personal history of nicotine dependence: Secondary | ICD-10-CM

## 2022-01-03 LAB — POC URINALSYSI DIPSTICK (AUTOMATED)
Bilirubin, UA: NEGATIVE
Blood, UA: NEGATIVE
Glucose, UA: NEGATIVE
Ketones, UA: NEGATIVE
Leukocytes, UA: NEGATIVE
Nitrite, UA: NEGATIVE
Protein, UA: NEGATIVE
Spec Grav, UA: 1.01 (ref 1.010–1.025)
Urobilinogen, UA: 0.2 E.U./dL
pH, UA: 6 (ref 5.0–8.0)

## 2022-01-03 NOTE — Progress Notes (Signed)
Phone: [phone]    Subjective:  Patient presents today for their annual physical. Side bar lists medicard- ut media shows [employer] plan. Chief complaint-noted.   See problem oriented charting- ROS- full  review of systems was completed and negative  except for: left foot pain  The following were reviewed and entered/updated in epic: Past Medical History:  Diagnosis Date   Smoker    5 pack years, trying to quit   Patient Active Problem List   Diagnosis Date Noted   GAD (generalized anxiety disorder) 10/07/2014    Priority: Medium    Depression 10/07/2014    Priority: Medium    Unprotected sex 10/07/2014    Priority: Low   Former smoker     Priority: Low   Past Surgical History:  Procedure Laterality Date   LEG SURGERY     within a month of birth, in cast for infancy    Family History  Problem Relation Age of Onset   Hypertension Mother    Hyperlipidemia Mother    Depression Mother        after loss of son   Anxiety disorder Mother        after loss of son   Colon polyps Mother        at 40   Other Father        does not talk to dad   Suicidality Brother        43, states was ruled accident, family think suicide   Obesity Sister    Syncope episode Sister    COPD Maternal Grandmother    Heart disease Maternal Grandfather     Medications- reviewed and updated Current Outpatient Medications  Medication Sig Dispense Refill   CYANOCOBALAMIN PO Take 1 tablet by mouth daily.     FERROUS SULFATE PO Take 1 tablet by mouth daily with breakfast.     VITAMIN D PO Take 1 tablet by mouth daily.     No current facility-administered medications for this visit.    Allergies-reviewed and updated Allergies  Allergen Reactions   Doxycycline Nausea And Vomiting    Social History   Social History Narrative   Long term relationship since 2017- live together (not his thing to get married). 64 year old son Quillian Quince in 12/2017.       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    HS graduate      Hobbies: time with girlfriend and son, netflix, working out- really enjoys   -prior photography      Objective:  BP 120/80   Pulse (!) 51   Temp 98.2 F (36.8 C)   Ht 6' (1.829 m)   Wt 161 lb 3.2 oz (73.1 kg)   SpO2 98%   BMI 21.86 kg/m  Gen: NAD, resting comfortably HEENT: Mucous membranes are moist. Oropharynx normal Neck: no thyromegaly CV: RRR no murmurs rubs or gallops Lungs: CTAB no crackles, wheeze, rhonchi Abdomen: soft/nontender/nondistended/normal bowel sounds. No rebound or guarding.  Ext: no edema Skin: warm, dry Neuro: grossly normal, moves all extremities, PERRLA     Assessment and Plan:  26 y.o. male presenting for annual physical.  Health Maintenance counseling: 1. Anticipatory guidance: Patient counseled regarding regular dental exams - advised q6 months, eye exams - thinks he is due- advised,  avoiding smoking and second hand smoke- off cigarettes for 6 years , limiting alcohol  to 2 beverages per day- doesn't drink, no illicit drugs- only marijuana.   2. Risk factor reduction:  Advised patient of need for regular exercise and diet rich and fruits and vegetables to reduce risk of heart attack and stroke.  Exercise- working out daily - does seated movements- doing legs like squats bothered his feet.  Diet/weight management-healthy BMI- variable diet tries to eat healthy- avoids fast food.  Wt Readings from Last 3 Encounters:  01/03/22 161 lb 3.2 oz (73.1 kg)  04/09/21 161 lb 12.8 oz (73.4 kg)  04/02/21 161 lb (73 kg)  3. Immunizations/screenings/ancillary studies- HCV screen with labs, opts out of covid vaccines Immunization History  Administered Date(s) Administered   Influenza,inj,Quad PF,6+ Mos 06/29/2019   PFIZER(Purple Top)SARS-COV-2 Vaccination 07/01/2020, 07/22/2020   Tdap 12/25/2017  4. Prostate cancer screening- no  known family history, start at age 89   5. Colon cancer screening - will need early colonoscopy with mom large polyp at 15- likely age 74 6. Skin cancer screening/prevention- no dermatologist- had some lesions removed here by Alyssa Allwardt, PA. advised regular sunscreen use. Denies worrisome, changing, or new skin lesions.  7. Testicular cancer screening- advised monthly self exams  8. STD screening- patient opts out as monogamous 9. Smoking associated screening- Former smoker- will get UA  Status of chronic or acute concerns   #Preoperative evaluation-patient has upcoming surgery with Dr. Sherryle Lis of podiatry for flatfoot reconstruction of left foot  -He is able to complete 4 METS of activity (works out regularly with lifting)without chest pain or shortness of breath and no further cardiac evaluation needed- will update labs -considering biking as well  #history of depression and anxiety- now off meds- full remission noted -some irritability still since loss of brother- not open to therapy at the time    01/03/2022    4:05 PM 06/29/2019    9:28 AM 12/25/2017   11:25 AM  Depression screen PHQ 2/9  Decreased Interest 0 0 2  Down, Depressed, Hopeless 0 0 1  PHQ - 2 Score 0 0 3  Altered sleeping 0 1 1  Tired, decreased energy 3  3  Change in appetite 0 0 2  Feeling bad or failure about yourself  0 1 1  Trouble concentrating 0 1 1  Moving slowly or fidgety/restless 0 0 0  Suicidal thoughts 0 0 0  PHQ-9 Score 3 3 11   Difficult doing work/chores Not difficult at all Not difficult at all Somewhat difficult   #hyperlipidemia S: Medication:none  Lab Results  Component Value Date   CHOL 167 10/07/2014   HDL 51.20 10/07/2014   LDLCALC 92 10/07/2014   TRIG 121.0 10/07/2014   CHOLHDL 3 10/07/2014   A/P: mild HLD- update labs as below  Recommended follow up: No follow-ups on file. Future Appointments  Date Time Provider [place]  02/06/2022 11:00 AM Marin Olp, MD  [facility]  02/26/2022  9:00 AM [facility] None  03/14/2022 10:15 AM [name], DPM [facility]  03/28/2022  9:45 AM Sherryle Lis, Stephan Minister, DPM [facility]  04/18/2022 10:15 AM McDonald, Stephan Minister, DPM [facility]   Lab/Order associations:NOT fasting   ICD-10-CM   1. Preventative health care  Z00.00 Comp Met (CMET)    CBC with Differential/Platelet    Lipid panel    TSH    TSH    Lipid panel    CBC with Differential/Platelet    Comp Met (CMET)    2. Encounter for hepatitis C  screening test for low risk patient  Z11.59 Hepatitis C antibody    3. GAD (generalized anxiety disorder)  F41.1     4. Depression, unspecified depression type  F32.A     5. Former smoker  Z87.891 POCT Urinalysis Dipstick (Automated)      No orders of the defined types were placed in this encounter.   Return precautions advised.   Garret Reddish, MD

## 2022-01-03 NOTE — Patient Instructions (Addendum)
-   Advised follow-up with both dentist and eye doctor  Thanks for doing labs- still need a urine and see team if they can add Hep C  Great to see you - hope surgery goes well!   Recommended follow up: Return in about 1 year (around 01/04/2023) for physical or sooner if needed.Schedule b4 you leave.

## 2022-01-04 LAB — CBC WITH DIFFERENTIAL/PLATELET
Basophils Absolute: 0 10*3/uL (ref 0.0–0.1)
Basophils Relative: 0.6 % (ref 0.0–3.0)
Eosinophils Absolute: 0.1 10*3/uL (ref 0.0–0.7)
Eosinophils Relative: 2.5 % (ref 0.0–5.0)
HCT: 42.3 % (ref 39.0–52.0)
Hemoglobin: 14.8 g/dL (ref 13.0–17.0)
Lymphocytes Relative: 41.1 % (ref 12.0–46.0)
Lymphs Abs: 2.3 10*3/uL (ref 0.7–4.0)
MCHC: 35 g/dL (ref 30.0–36.0)
MCV: 93.9 fl (ref 78.0–100.0)
Monocytes Absolute: 0.3 10*3/uL (ref 0.1–1.0)
Monocytes Relative: 6 % (ref 3.0–12.0)
Neutro Abs: 2.8 10*3/uL (ref 1.4–7.7)
Neutrophils Relative %: 49.8 % (ref 43.0–77.0)
Platelets: 258 10*3/uL (ref 150.0–400.0)
RBC: 4.51 Mil/uL (ref 4.22–5.81)
RDW: 12 % (ref 11.5–15.5)
WBC: 5.5 10*3/uL (ref 4.0–10.5)

## 2022-01-04 LAB — LIPID PANEL
Cholesterol: 202 mg/dL — ABNORMAL HIGH (ref 0–200)
HDL: 54.1 mg/dL (ref >39.00)
NonHDL: 147.96
Total CHOL/HDL Ratio: 4
Triglycerides: 213 mg/dL — ABNORMAL HIGH (ref 0.0–149.0)
VLDL: 42.6 mg/dL — ABNORMAL HIGH (ref 0.0–40.0)

## 2022-01-04 LAB — COMPREHENSIVE METABOLIC PANEL
ALT: 14 U/L (ref 0–53)
AST: 16 U/L (ref 0–37)
Albumin: 5 g/dL (ref 3.5–5.2)
Alkaline Phosphatase: 55 U/L (ref 39–117)
BUN: 14 mg/dL (ref 6–23)
CO2: 28 mEq/L (ref 19–32)
Calcium: 9.7 mg/dL (ref 8.4–10.5)
Chloride: 103 mEq/L (ref 96–112)
Creatinine, Ser: 1.24 mg/dL (ref 0.40–1.50)
GFR: 79.5 mL/min (ref >60.00)
Glucose, Bld: 108 mg/dL — ABNORMAL HIGH (ref 70–99)
Potassium: 4.1 mEq/L (ref 3.5–5.1)
Sodium: 141 mEq/L (ref 135–145)
Total Bilirubin: 0.9 mg/dL (ref 0.2–1.2)
Total Protein: 6.9 g/dL (ref 6.0–8.3)

## 2022-01-04 LAB — TSH: TSH: 2.25 u[IU]/mL (ref 0.35–5.50)

## 2022-01-04 LAB — LDL CHOLESTEROL, DIRECT: Direct LDL: 130 mg/dL

## 2022-01-08 DIAGNOSIS — M79676 Pain in unspecified toe(s): Secondary | ICD-10-CM

## 2022-01-08 LAB — HEPATITIS C ANTIBODY
Hepatitis C Ab: NONREACTIVE
SIGNAL TO CUT-OFF: 0.09 (ref <1.00)

## 2022-01-17 ENCOUNTER — Encounter: Payer: Medicaid Other | Admitting: Podiatry

## 2022-01-21 ENCOUNTER — Emergency Department (HOSPITAL_BASED_OUTPATIENT_CLINIC_OR_DEPARTMENT_OTHER)
Admission: EM | Admit: 2022-01-21 | Discharge: 2022-01-21 | Disposition: A | Discharge status: Home / Self Care | Payer: Medicaid Other | Attending: Emergency Medicine | Admitting: Emergency Medicine

## 2022-01-21 ENCOUNTER — Encounter (HOSPITAL_BASED_OUTPATIENT_CLINIC_OR_DEPARTMENT_OTHER): Payer: Self-pay

## 2022-01-21 ENCOUNTER — Emergency Department (HOSPITAL_BASED_OUTPATIENT_CLINIC_OR_DEPARTMENT_OTHER): Payer: Medicaid Other | Admitting: Radiology

## 2022-01-21 ENCOUNTER — Other Ambulatory Visit: Payer: Self-pay

## 2022-01-21 DIAGNOSIS — Y9344 Activity, trampolining: Secondary | ICD-10-CM | POA: Diagnosis not present

## 2022-01-21 DIAGNOSIS — X509XXA Other and unspecified overexertion or strenuous movements or postures, initial encounter: Secondary | ICD-10-CM | POA: Diagnosis not present

## 2022-01-21 DIAGNOSIS — S93401A Sprain of unspecified ligament of right ankle, initial encounter: Secondary | ICD-10-CM | POA: Diagnosis not present

## 2022-01-21 DIAGNOSIS — S99911A Unspecified injury of right ankle, initial encounter: Secondary | ICD-10-CM | POA: Diagnosis present

## 2022-01-21 IMAGING — DX DG ANKLE COMPLETE 3+V*R*
3 series · 3 of 3 positions shown · non-contrast
Comparison: None Available.

CLINICAL DATA: Ankle injury

EXAM:
RIGHT ANKLE - COMPLETE 3+ VIEW

[ankle ap]
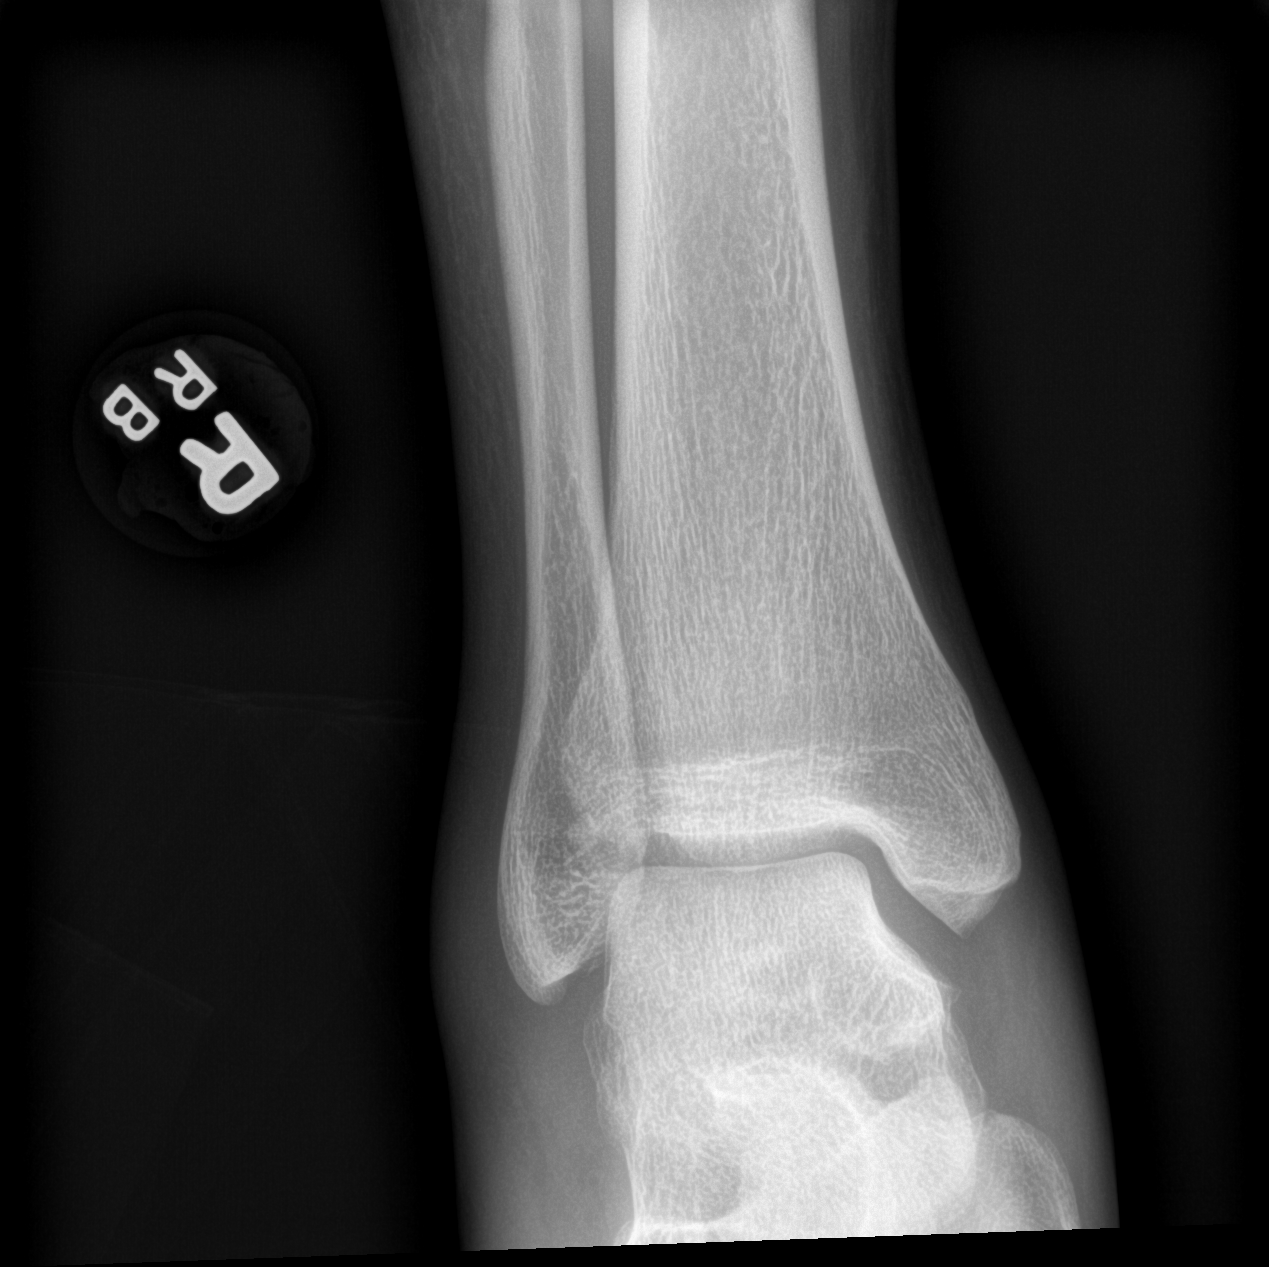

[ankle obl]
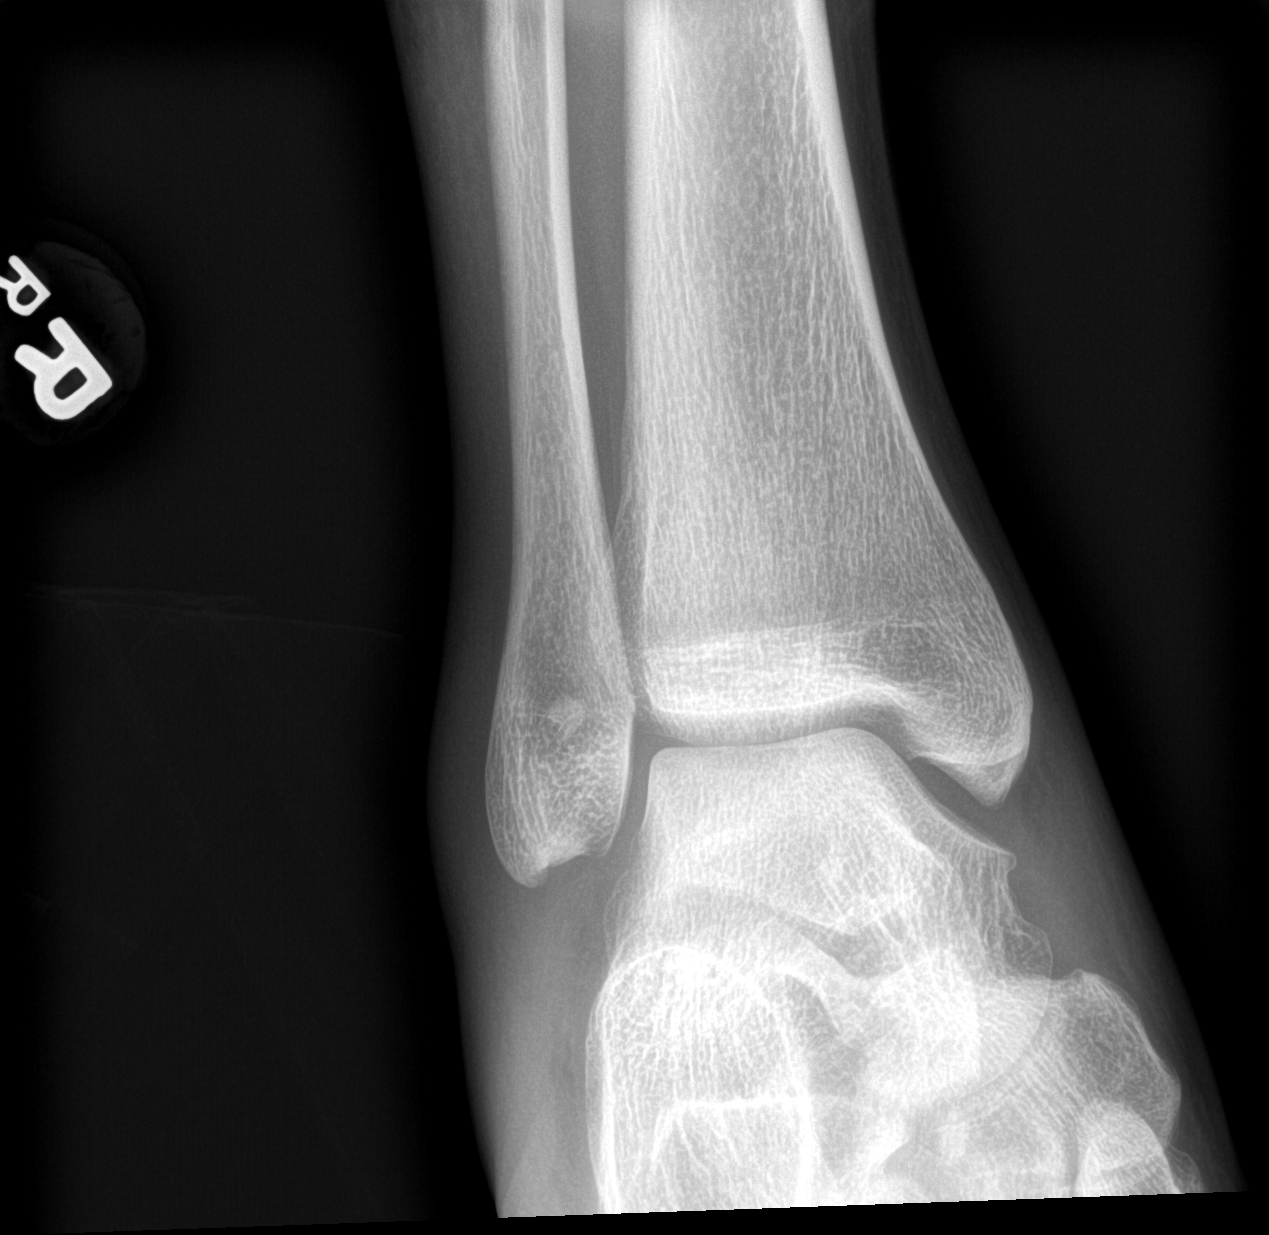

[ankle lat]
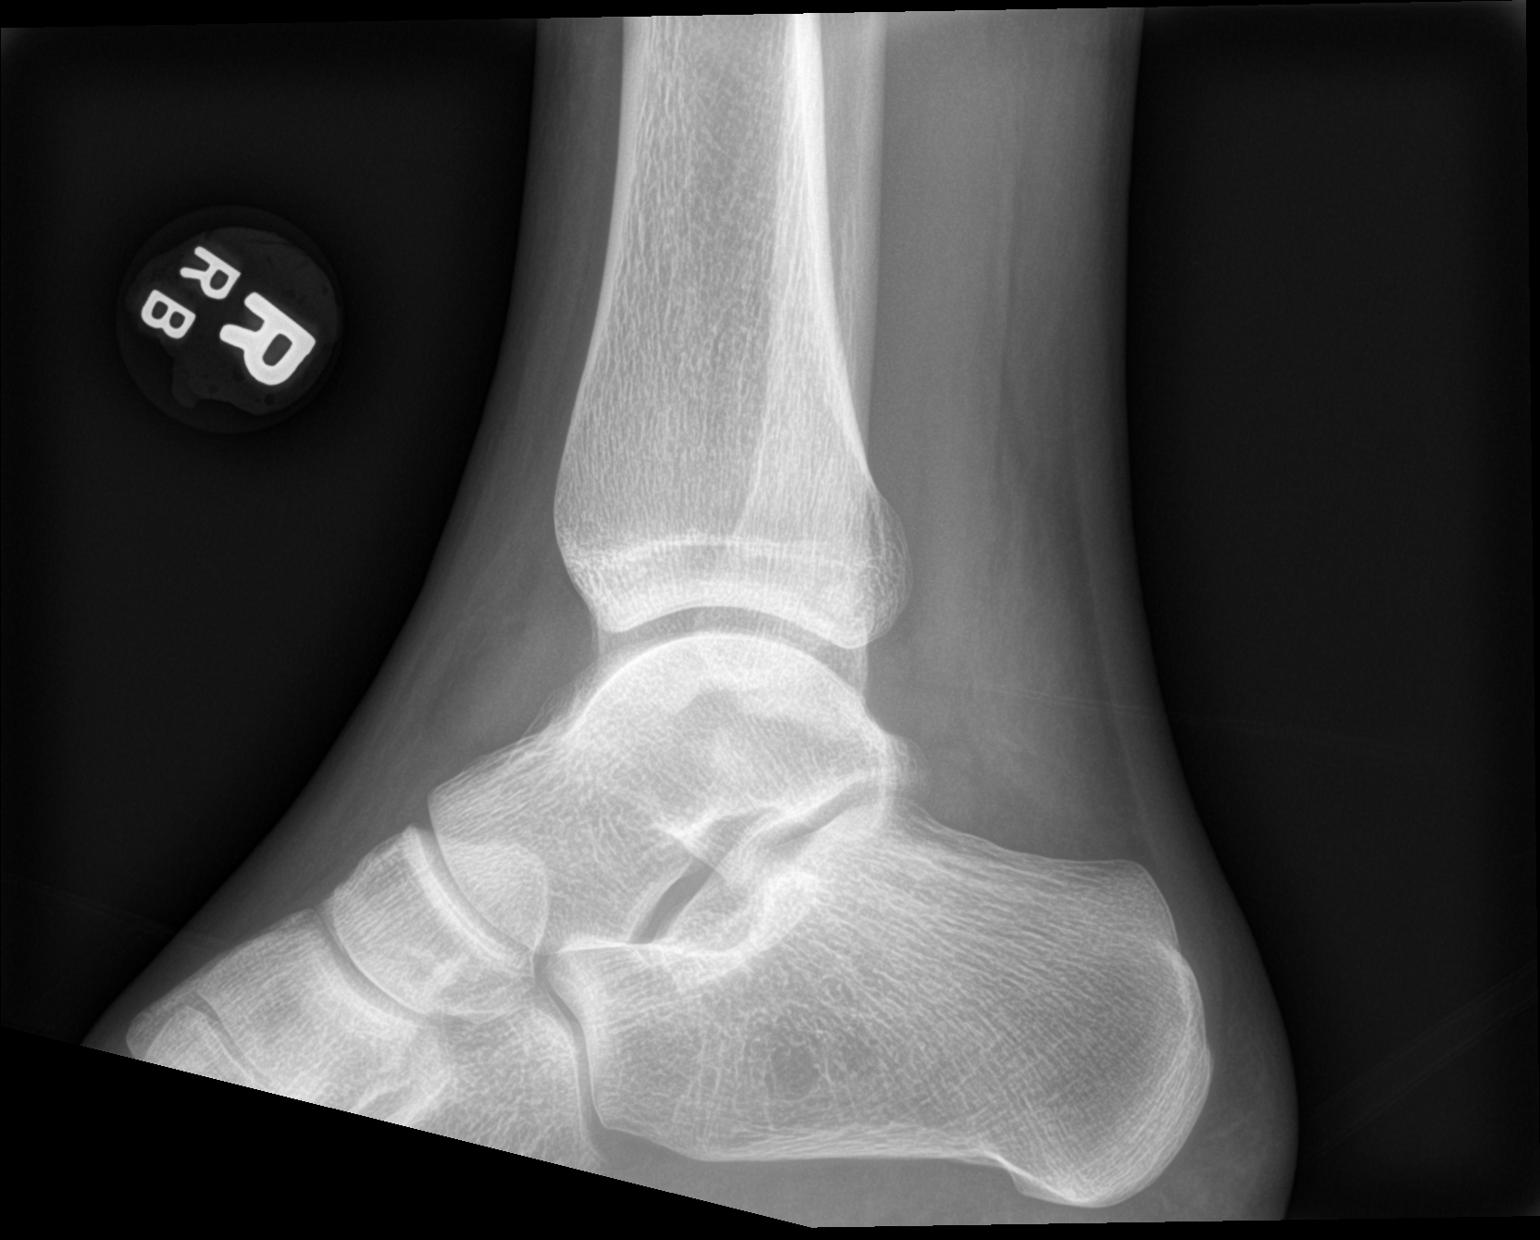

[3 of 3 positions shown; findings below may reference images not displayed]

FINDINGS: No fracture or malalignment. Lateral soft tissue swelling. Ankle
mortise is symmetric.
IMPRESSION: Soft tissue swelling.  No acute osseous abnormality

## 2022-01-21 NOTE — ED Provider Notes (Signed)
[facility] EMERGENCY DEPT Provider Note   CSN: [id number] Arrival date & time: 01/21/22  1629     History  No chief complaint on file.   Shawn Harper is a 26 y.o. male.  Pt is a 26 yo male with no significant pmhx.  He was jumping at the trampoline park with his son on Saturday, June 10.  He rolled his right ankle and it hurt a lot.  He said it is still swollen and painful today.  He wanted to make sure it was not broken.       Home Medications Prior to Admission medications   Medication Sig Start Date End Date Taking? Authorizing Provider  CYANOCOBALAMIN PO Take 1 tablet by mouth daily.    [provider]  FERROUS SULFATE PO Take 1 tablet by mouth daily with breakfast.    [provider]  VITAMIN D PO Take 1 tablet by mouth daily.    [provider]      Allergies    Doxycycline    Review of Systems   Review of Systems  Musculoskeletal:        Right ankle pain  All other systems reviewed and are negative.   Physical Exam Updated Vital Signs BP 128/73 (BP Location: Right Arm)   Pulse (!) 51   Temp 98.1 F (36.7 C) (Oral)   Resp 16   Ht '6\' 1"'$  (1.854 m)   Wt 73.9 kg   SpO2 99%   BMI 21.51 kg/m  Physical Exam Vitals and nursing note reviewed.  Constitutional:      Appearance: Normal appearance.  HENT:     Head: Normocephalic and atraumatic.     Right Ear: External ear normal.     Left Ear: External ear normal.     Nose: Nose normal.     Mouth/Throat:     Mouth: Mucous membranes are moist.     Pharynx: Oropharynx is clear.  Eyes:     Extraocular Movements: Extraocular movements intact.     Conjunctiva/sclera: Conjunctivae normal.     Pupils: Pupils are equal, round, and reactive to light.  Cardiovascular:     Rate and Rhythm: Normal rate and regular rhythm.     Pulses: Normal pulses.     Heart sounds: Normal heart sounds.  Pulmonary:     Effort: Pulmonary effort is normal.     Breath sounds: Normal breath  sounds.  Abdominal:     General: Abdomen is flat. Bowel sounds are normal.     Palpations: Abdomen is soft.  Musculoskeletal:     Cervical back: Normal range of motion and neck supple.       Legs:  Skin:    General: Skin is warm.     Capillary Refill: Capillary refill takes less than 2 seconds.  Neurological:     General: No focal deficit present.     Mental Status: He is alert and oriented to person, place, and time.  Psychiatric:        Mood and Affect: Mood normal.        Behavior: Behavior normal.     ED Results / Procedures / Treatments   Labs (all labs ordered are listed, but only abnormal results are displayed) Labs Reviewed - No data to display  EKG None  Radiology DG Ankle Complete Right  Result Date: 01/21/2022 CLINICAL DATA:  Ankle injury EXAM: RIGHT ANKLE - COMPLETE 3+ VIEW COMPARISON:  None Available. FINDINGS: No fracture or malalignment. Lateral soft  tissue swelling. Ankle mortise is symmetric. IMPRESSION: Soft tissue swelling.  No acute osseous abnormality Electronically Signed   By: Donavan Foil M.D.   On: 01/21/2022 17:41    Procedures Procedures    Medications Ordered in ED Medications - No data to display  ED Course/ Medical Decision Making/ A&P                           Medical Decision Making Amount and/or Complexity of Data Reviewed Radiology: ordered.   This patient presents to the ED for concern of ankle pain, this involves an extensive number of treatment options, and is a complaint that carries with it a high risk of complications and morbidity.  The differential diagnosis includes sprain, fx   Co morbidities that complicate the patient evaluation  none   Additional history obtained:  Additional history obtained from epic chart review  Imaging Studies ordered:  I ordered imaging studies including right ankle  I independently visualized and interpreted imaging which showed  IMPRESSION:  Soft tissue swelling.  No acute osseous  abnormality   I agree with the radiologist interpretation  Problem List / ED Course:  Right ankle sprain:  no evidence of fx on x-ray.  Pt did not want anything for pain.  He is placed in an ASO brace.  He has crutches.  He is to f/u with ortho.  Return if worse.   Reevaluation:  After the interventions noted above, I reevaluated the patient and found that they have :improved   Social Determinants of Health:  Lives at home with family   Dispostion:  After consideration of the diagnostic results and the patients response to treatment, I feel that the patent would benefit from discharge with outpatient f/u.          Final Clinical Impression(s) / ED Diagnoses Final diagnoses:  Sprain of right ankle, unspecified ligament, initial encounter    Rx / DC Orders ED Discharge Orders     None         Shawn Pence, MD 01/21/22 Bosie Helper

## 2022-01-21 NOTE — ED Triage Notes (Signed)
"  Rolled my left ankle at the trampoline park on Saturday and think I sprained it" per pt

## 2022-02-06 ENCOUNTER — Ambulatory Visit (INDEPENDENT_AMBULATORY_CARE_PROVIDER_SITE_OTHER): Payer: Medicaid Other | Admitting: Family Medicine

## 2022-02-06 ENCOUNTER — Encounter: Payer: Self-pay | Admitting: Family Medicine

## 2022-02-06 VITALS — BP 120/60 | HR 78 | Temp 98.1°F | Ht 73.0 in | Wt 159.6 lb

## 2022-02-06 DIAGNOSIS — Z01810 Encounter for preprocedural cardiovascular examination: Secondary | ICD-10-CM

## 2022-02-06 NOTE — Progress Notes (Addendum)
Patient was already cleared for surgery on 01/03/22. I met with him today and he has had no changes in health on review today. Forms sent/faxed back to podiatry.  -reports history of sinus arrhythmia which is a normal variant in someone his age- EKG not recommended BP 120/60   Pulse 78   Temp 98.1 F (36.7 C)   Ht 6' 1"  (1.854 m)   Wt 159 lb 9.6 oz (72.4 kg)   SpO2 97%   BMI 21.06 kg/m  LOS no charge for vist today

## 2022-02-07 ENCOUNTER — Encounter: Payer: Medicaid Other | Admitting: Podiatry

## 2022-02-08 ENCOUNTER — Telehealth: Payer: Self-pay

## 2022-02-08 NOTE — Telephone Encounter (Signed)
DOS 03/08/2022  [employer] MEDICAID    NOTIFICATION/PRIOR AUTHORIZATION NUMBER CASE STATUS CASE STATUS REASON PRIMARY CARE PHYSICIAN [id number] [facility] [place] NOTIFY DATE/TIME ADMISSION NOTIFY [place] 02/06/2022 10:52 AM CDT - COVERAGE STATUS OVERALL COVERAGE STATUS Covered/Approved 1-5 CODE DESCRIPTION COVERAGE STATUS DECISION DATE [place] Coverage determination is reflected for the facility admission and is not a guarantee of payment for ongoing services. Covered/Approved 02/07/2022 1 [id number] Lengthening or shortening of tendon, leg more Covered/Approved 02/07/2022 2 20900 Bone graft, any donor area; minor or sma more Covered/Approved 02/07/2022 3 [id number] Arthrodesis; subtalar Covered/Approved 02/07/2022 4 [id number] Arthrodesis, midtarsal or tarsometatarsa more Covered/Approved 02/07/2022 5 28304 Osteotomy, tarsal bones, other than calc more Covered/Approved 02/07/2022

## 2022-02-26 ENCOUNTER — Other Ambulatory Visit: Payer: Self-pay

## 2022-02-26 ENCOUNTER — Encounter
Admission: RE | Admit: 2022-02-26 | Discharge: 2022-02-26 | Disposition: A | Discharge status: Home / Self Care | Payer: Medicaid Other | Source: Ambulatory Visit | Attending: Podiatry | Admitting: Podiatry

## 2022-02-26 HISTORY — DX: Depression, unspecified: F32.A

## 2022-02-26 HISTORY — DX: Headache, unspecified: R51.9

## 2022-02-26 HISTORY — DX: Anxiety disorder, unspecified: F41.9

## 2022-02-26 HISTORY — DX: Cardiac arrhythmia, unspecified: I49.9

## 2022-02-26 NOTE — Patient Instructions (Addendum)
Your procedure is scheduled on: 03/08/22 - Friday Report to the Registration Desk on the 1st floor of the [facility]. To find out your arrival time, please call [phone] between 1PM - 3PM on: 03/07/22 - Thursday If your arrival time is 6:00 am, do not arrive prior to that time as the [facility] entrance doors do not open until 6:00 am.  REMEMBER: Instructions that are not followed completely may result in serious medical risk, up to and including death; or upon the discretion of your surgeon and anesthesiologist your surgery may need to be rescheduled.  Do not eat food or drink any fluids after midnight the night before surgery.  No gum chewing, lozengers or hard candies.   TAKE THESE MEDICATIONS THE MORNING OF SURGERY WITH A SIP OF WATER: NONE  One week prior to surgery: Stop Anti-inflammatories (NSAIDS) such as Advil, Aleve, Ibuprofen, Motrin, Naproxen, Naprosyn and Aspirin based products such as Excedrin, Goodys Powder, BC Powder.  Stop ANY OVER THE COUNTER supplements until after surgery.  You may however, continue to take Tylenol if needed for pain up until the day of surgery.  No Alcohol for 24 hours before or after surgery.  No Smoking including e-cigarettes for 24 hours prior to surgery.  No chewable tobacco products for at least 6 hours prior to surgery.  No nicotine patches on the day of surgery.  Do not use any "recreational" drugs for at least a week prior to your surgery.  Please be advised that the combination of cocaine and anesthesia may have negative outcomes, up to and including death. If you test positive for cocaine, your surgery will be cancelled.  On the morning of surgery brush your teeth with toothpaste and water, you may rinse your mouth with mouthwash if you wish. Do not swallow any toothpaste or mouthwash.  Use CHG Soap or wipes as directed on instruction sheet.  Do not wear jewelry, make-up, hairpins, clips or nail polish.  Do not wear  lotions, powders, or perfumes.   Do not shave body from the neck down 48 hours prior to surgery just in case you cut yourself which could leave a site for infection.  Also, freshly shaved skin may become irritated if using the CHG soap.  Contact lenses, hearing aids and dentures may not be worn into surgery.  Do not bring valuables to the hospital. [facility] is not responsible for any missing/lost belongings or valuables.   Notify your doctor if there is any change in your medical condition (cold, fever, infection).  Wear comfortable clothing (specific to your surgery type) to the hospital.  After surgery, you can help prevent lung complications by doing breathing exercises.  Take deep breaths and cough every 1-2 hours. Your doctor may order a device called an Incentive Spirometer to help you take deep breaths. When coughing or sneezing, hold a pillow firmly against your incision with both hands. This is called "splinting." Doing this helps protect your incision. It also decreases belly discomfort.  If you are being admitted to the hospital overnight, leave your suitcase in the car. After surgery it may be brought to your room.  If you are being discharged the day of surgery, you will not be allowed to drive home. You will need a responsible adult (18 years or older) to drive you home and stay with you that night.   If you are taking public transportation, you will need to have a responsible adult (18 years or older) with you. Please  confirm with your physician that it is acceptable to use public transportation.   Please call the [facility] Dept. at [phone] if you have any questions about these instructions.  Surgery Visitation Policy:  Patients undergoing a surgery or procedure may have two family members or support persons with them as long as the person is not COVID-19 positive or experiencing its symptoms.   Inpatient Visitation:    Visiting hours are 7 a.m.  to 8 p.m. Up to four visitors are allowed at one time in a patient room, including children. The visitors may rotate out with other people during the day. One designated support person (adult) may remain overnight.

## 2022-03-08 ENCOUNTER — Ambulatory Visit: Payer: Medicaid Other

## 2022-03-08 ENCOUNTER — Ambulatory Visit: Payer: Medicaid Other | Admitting: Certified Registered"

## 2022-03-08 ENCOUNTER — Encounter
Admission: RE | Disposition: A | Discharge status: Home / Self Care | Payer: Self-pay | Source: Home / Self Care | Attending: Podiatry

## 2022-03-08 ENCOUNTER — Other Ambulatory Visit: Payer: Self-pay

## 2022-03-08 ENCOUNTER — Ambulatory Visit: Payer: Medicaid Other | Admitting: Urgent Care

## 2022-03-08 ENCOUNTER — Ambulatory Visit
Admission: RE | Admit: 2022-03-08 | Discharge: 2022-03-08 | Disposition: A | Discharge status: Home / Self Care | Payer: Medicaid Other | Attending: Podiatry | Admitting: Podiatry

## 2022-03-08 ENCOUNTER — Encounter: Payer: Self-pay | Admitting: Podiatry

## 2022-03-08 DIAGNOSIS — Q6689 Other  specified congenital deformities of feet: Secondary | ICD-10-CM | POA: Diagnosis not present

## 2022-03-08 DIAGNOSIS — Z87891 Personal history of nicotine dependence: Secondary | ICD-10-CM | POA: Diagnosis not present

## 2022-03-08 DIAGNOSIS — F32A Depression, unspecified: Secondary | ICD-10-CM | POA: Diagnosis not present

## 2022-03-08 DIAGNOSIS — M19072 Primary osteoarthritis, left ankle and foot: Secondary | ICD-10-CM | POA: Diagnosis not present

## 2022-03-08 DIAGNOSIS — F419 Anxiety disorder, unspecified: Secondary | ICD-10-CM | POA: Insufficient documentation

## 2022-03-08 HISTORY — PX: APPLICATION OF WOUND VAC: SHX5189

## 2022-03-08 HISTORY — PX: FLAT FOOT RECONSTRUCTION-TAL GASTROC RECESSION: SHX6620

## 2022-03-08 SURGERY — FLAT FOOT RECONSTRUCTION-TAL GASTROC RECESSION
Anesthesia: General | Laterality: Left

## 2022-03-08 MED ORDER — LACTATED RINGERS IV SOLN: INTRAVENOUS | Status: DC

## 2022-03-08 MED ORDER — LIDOCAINE HCL (CARDIAC) PF 100 MG/5ML IV SOSY
PREFILLED_SYRINGE | INTRAVENOUS | Status: DC | PRN
  Administered 2022-03-08: 60 mg via INTRAVENOUS

## 2022-03-08 MED ORDER — IBUPROFEN 600 MG PO TABS: 600.0000 mg | ORAL_TABLET | Freq: Three times a day (TID) | ORAL | 0 refills | Status: DC | PRN

## 2022-03-08 MED ORDER — DEXAMETHASONE SODIUM PHOSPHATE 10 MG/ML IJ SOLN
INTRAMUSCULAR | Status: DC | PRN
  Administered 2022-03-08: 10 mg via INTRAVENOUS

## 2022-03-08 MED ORDER — DROPERIDOL 2.5 MG/ML IJ SOLN
INTRAMUSCULAR | Status: AC
  Administered 2022-03-08: 0.625 mg via INTRAVENOUS
  Filled 2022-03-08: qty 2

## 2022-03-08 MED ORDER — GLYCOPYRROLATE 0.2 MG/ML IJ SOLN
INTRAMUSCULAR | Status: DC | PRN
  Administered 2022-03-08: .2 mg via INTRAVENOUS

## 2022-03-08 MED ORDER — ASPIRIN 325 MG PO TBEC: 325.0000 mg | DELAYED_RELEASE_TABLET | Freq: Two times a day (BID) | ORAL | 0 refills | Status: AC

## 2022-03-08 MED ORDER — DEXMEDETOMIDINE (PRECEDEX) IN NS 20 MCG/5ML (4 MCG/ML) IV SYRINGE
PREFILLED_SYRINGE | INTRAVENOUS | Status: DC | PRN
  Administered 2022-03-08: 8 ug via INTRAVENOUS

## 2022-03-08 MED ORDER — FENTANYL CITRATE (PF) 100 MCG/2ML IJ SOLN
INTRAMUSCULAR | Status: AC
  Filled 2022-03-08: qty 2

## 2022-03-08 MED ORDER — CHLORHEXIDINE GLUCONATE 0.12 % MT SOLN: 15.0000 mL | Freq: Once | OROMUCOSAL | Status: AC

## 2022-03-08 MED ORDER — HYDROMORPHONE HCL 1 MG/ML IJ SOLN
INTRAMUSCULAR | Status: DC | PRN
  Administered 2022-03-08: 1 mg via INTRAVENOUS

## 2022-03-08 MED ORDER — LIDOCAINE HCL (PF) 1 % IJ SOLN
INTRAMUSCULAR | Status: AC
  Filled 2022-03-08: qty 2

## 2022-03-08 MED ORDER — ONDANSETRON HCL 4 MG PO TABS: 4.0000 mg | ORAL_TABLET | Freq: Three times a day (TID) | ORAL | 0 refills | Status: DC | PRN

## 2022-03-08 MED ORDER — PROMETHAZINE HCL 25 MG/ML IJ SOLN: 6.2500 mg | INTRAMUSCULAR | Status: DC | PRN

## 2022-03-08 MED ORDER — LIDOCAINE HCL (PF) 1 % IJ SOLN
INTRAMUSCULAR | Status: DC | PRN
  Administered 2022-03-08 (×2): 1 mL via SUBCUTANEOUS

## 2022-03-08 MED ORDER — OXYCODONE HCL 5 MG/5ML PO SOLN: 5.0000 mg | Freq: Once | ORAL | Status: DC | PRN

## 2022-03-08 MED ORDER — OXYCODONE HCL 5 MG PO TABS: 5.0000 mg | ORAL_TABLET | ORAL | 0 refills | Status: DC | PRN

## 2022-03-08 MED ORDER — MIDAZOLAM HCL 5 MG/5ML IJ SOLN
INTRAMUSCULAR | Status: DC | PRN
  Administered 2022-03-08: 2 mg via INTRAVENOUS

## 2022-03-08 MED ORDER — ACETAMINOPHEN 10 MG/ML IV SOLN
INTRAVENOUS | Status: DC | PRN
  Administered 2022-03-08: 1000 mg via INTRAVENOUS

## 2022-03-08 MED ORDER — PHENYLEPHRINE HCL-NACL 20-0.9 MG/250ML-% IV SOLN
INTRAVENOUS | Status: DC | PRN
  Administered 2022-03-08: 25 ug/min via INTRAVENOUS

## 2022-03-08 MED ORDER — GABAPENTIN 300 MG PO CAPS: 300.0000 mg | ORAL_CAPSULE | Freq: Three times a day (TID) | ORAL | 0 refills | Status: DC

## 2022-03-08 MED ORDER — CEFAZOLIN SODIUM-DEXTROSE 2-4 GM/100ML-% IV SOLN
INTRAVENOUS | Status: AC
  Filled 2022-03-08: qty 100

## 2022-03-08 MED ORDER — MIDAZOLAM HCL 2 MG/2ML IJ SOLN
INTRAMUSCULAR | Status: DC | PRN
  Administered 2022-03-08: 2 mg via INTRAVENOUS

## 2022-03-08 MED ORDER — BUPIVACAINE HCL (PF) 0.5 % IJ SOLN
INTRAMUSCULAR | Status: AC
  Filled 2022-03-08: qty 30

## 2022-03-08 MED ORDER — KETOROLAC TROMETHAMINE 30 MG/ML IJ SOLN
INTRAMUSCULAR | Status: DC | PRN
  Administered 2022-03-08: 30 mg via INTRAVENOUS

## 2022-03-08 MED ORDER — 0.9 % SODIUM CHLORIDE (POUR BTL) OPTIME
TOPICAL | Status: DC | PRN
  Administered 2022-03-08: 500 mL

## 2022-03-08 MED ORDER — BUPIVACAINE HCL (PF) 0.5 % IJ SOLN
INTRAMUSCULAR | Status: DC | PRN
  Administered 2022-03-08: 10 mL via PERINEURAL
  Administered 2022-03-08: 20 mL via PERINEURAL

## 2022-03-08 MED ORDER — ORAL CARE MOUTH RINSE: 15.0000 mL | Freq: Once | OROMUCOSAL | Status: AC

## 2022-03-08 MED ORDER — EPHEDRINE SULFATE (PRESSORS) 50 MG/ML IJ SOLN
INTRAMUSCULAR | Status: DC | PRN
  Administered 2022-03-08: 5 mg via INTRAVENOUS

## 2022-03-08 MED ORDER — ACETAMINOPHEN 500 MG PO TABS: 1000.0000 mg | ORAL_TABLET | Freq: Four times a day (QID) | ORAL | 0 refills | Status: AC | PRN

## 2022-03-08 MED ORDER — CEFAZOLIN SODIUM-DEXTROSE 2-4 GM/100ML-% IV SOLN
2.0000 g | INTRAVENOUS | Status: AC
  Administered 2022-03-08 (×2): 2 g via INTRAVENOUS

## 2022-03-08 MED ORDER — DROPERIDOL 2.5 MG/ML IJ SOLN
0.6250 mg | Freq: Once | INTRAMUSCULAR | Status: AC | PRN
Start: 2022-03-08 — End: 2022-03-08

## 2022-03-08 MED ORDER — FAMOTIDINE 20 MG PO TABS
ORAL_TABLET | ORAL | Status: AC
  Administered 2022-03-08: 20 mg via ORAL
  Filled 2022-03-08: qty 1

## 2022-03-08 MED ORDER — FENTANYL CITRATE (PF) 100 MCG/2ML IJ SOLN: 25.0000 ug | INTRAMUSCULAR | Status: DC | PRN

## 2022-03-08 MED ORDER — PHENYLEPHRINE 80 MCG/ML (10ML) SYRINGE FOR IV PUSH (FOR BLOOD PRESSURE SUPPORT)
PREFILLED_SYRINGE | INTRAVENOUS | Status: DC | PRN
  Administered 2022-03-08 (×3): 160 ug via INTRAVENOUS

## 2022-03-08 MED ORDER — ROCURONIUM BROMIDE 100 MG/10ML IV SOLN
INTRAVENOUS | Status: DC | PRN
  Administered 2022-03-08: 50 mg via INTRAVENOUS

## 2022-03-08 MED ORDER — ACETAMINOPHEN 10 MG/ML IV SOLN: 1000.0000 mg | Freq: Once | INTRAVENOUS | Status: DC | PRN

## 2022-03-08 MED ORDER — MIDAZOLAM HCL 2 MG/2ML IJ SOLN
INTRAMUSCULAR | Status: AC
  Filled 2022-03-08: qty 2

## 2022-03-08 MED ORDER — OXYCODONE HCL 5 MG PO TABS: 5.0000 mg | ORAL_TABLET | Freq: Once | ORAL | Status: DC | PRN

## 2022-03-08 MED ORDER — CHLORHEXIDINE GLUCONATE 0.12 % MT SOLN
OROMUCOSAL | Status: AC
  Administered 2022-03-08: 15 mL via OROMUCOSAL
  Filled 2022-03-08: qty 15

## 2022-03-08 MED ORDER — PROPOFOL 10 MG/ML IV BOLUS
INTRAVENOUS | Status: DC | PRN
  Administered 2022-03-08: 200 mg via INTRAVENOUS

## 2022-03-08 MED ORDER — FAMOTIDINE 20 MG PO TABS: 20.0000 mg | ORAL_TABLET | Freq: Once | ORAL | Status: AC

## 2022-03-08 MED ORDER — DEXMEDETOMIDINE HCL IN NACL 200 MCG/50ML IV SOLN
INTRAVENOUS | Status: DC | PRN
  Administered 2022-03-08: 12 ug via INTRAVENOUS
  Administered 2022-03-08: 8 ug via INTRAVENOUS

## 2022-03-08 MED ORDER — ONDANSETRON HCL 4 MG/2ML IJ SOLN
INTRAMUSCULAR | Status: DC | PRN
  Administered 2022-03-08 (×2): 4 mg via INTRAVENOUS

## 2022-03-08 MED ORDER — FENTANYL CITRATE (PF) 100 MCG/2ML IJ SOLN
INTRAMUSCULAR | Status: DC | PRN
  Administered 2022-03-08 (×2): 50 ug via INTRAVENOUS

## 2022-03-08 SURGICAL SUPPLY — 71 items
3.2 kwire threaded IMPLANT
BASIN GRAD PLASTIC 32OZ STRL (MISCELLANEOUS) ×3 IMPLANT
BIT DRILL CANN 3.5 LRG (BIT) ×1 IMPLANT
BIT DRILL CANN 4.9 LRG AO FIT (BIT) IMPLANT
BLADE MED AGGRESSIVE (BLADE) ×3 IMPLANT
BLADE OSCILLATING/SAGITTAL (BLADE) ×3
BLADE SURG MINI STRL (BLADE) ×3 IMPLANT
BLADE SW THK.38XMED LNG THN (BLADE) IMPLANT
BNDG COHESIVE 4X5 TAN ST LF (GAUZE/BANDAGES/DRESSINGS) ×4 IMPLANT
BNDG ESMARK 4X12 TAN STRL LF (GAUZE/BANDAGES/DRESSINGS) ×3 IMPLANT
BNDG GAUZE DERMACEA FLUFF (GAUZE/BANDAGES/DRESSINGS)
BNDG GAUZE DERMACEA FLUFF 4 (GAUZE/BANDAGES/DRESSINGS) ×2 IMPLANT
BNDG GZE DERMACEA 4 6PLY (GAUZE/BANDAGES/DRESSINGS)
BONE CANC CHIPS 20CC PCAN1/4 (Bone Implant) ×3 IMPLANT
BONEGRAFT ALLOMATRIX W/CHIPS (Bone Implant) ×3 IMPLANT
CHIPS CANC BONE 20CC PCAN1/4 (Bone Implant) ×2 IMPLANT
COVER PIN YLW 0.028-062 (MISCELLANEOUS) ×6 IMPLANT
CUFF TOURN SGL QUICK 12 (TOURNIQUET CUFF) ×1 IMPLANT
DRAPE FLUOR MINI C-ARM 54X84 (DRAPES) ×2 IMPLANT
DRILL CANNULATED 4.9 (BIT) ×3
DURAPREP 26ML APPLICATOR (WOUND CARE) ×3 IMPLANT
ELECT REM PT RETURN 9FT ADLT (ELECTROSURGICAL) ×3
ELECTRODE REM PT RTRN 9FT ADLT (ELECTROSURGICAL) ×2 IMPLANT
Fenestrated 8 ga x 15cm Bullet Tip IMPLANT
GAUZE SPONGE 4X4 12PLY STRL (GAUZE/BANDAGES/DRESSINGS) ×3 IMPLANT
GAUZE XEROFORM 1X8 LF (GAUZE/BANDAGES/DRESSINGS) ×3 IMPLANT
GLOVE BIO SURGEON STRL SZ7 (GLOVE) ×3 IMPLANT
GLOVE BIOGEL PI IND STRL 7.0 (GLOVE) ×2 IMPLANT
GLOVE BIOGEL PI INDICATOR 7.0 (GLOVE) ×3
GOWN STRL REUS W/ TWL LRG LVL3 (GOWN DISPOSABLE) ×2 IMPLANT
GOWN STRL REUS W/TWL LRG LVL3 (GOWN DISPOSABLE) ×3
GRAFT BNE CANC CHIPS 1-8 20CC (Bone Implant) IMPLANT
GRAFT BONE ALLOMATRIX W/CHIPS (Bone Implant) IMPLANT
GUIDEWIRE 3.2 THREADED (WIRE) ×2 IMPLANT
GUIDEWIRE UNTHREAD 3.2X150 (WIRE) ×1 IMPLANT
GUIDEWIRE UNTHREADED 2.0X150 (WIRE) ×1 IMPLANT
K-WIRE ORTHOPEDIC 2X15CML THR (WIRE) ×3
KIT PREVENA INCISION MGT 13 (CANNISTER) ×1 IMPLANT
KIT TURNOVER KIT A (KITS) ×3 IMPLANT
KWIRE ORTHOPEDIC 2X15CML THR (WIRE) IMPLANT
MANIFOLD NEPTUNE II (INSTRUMENTS) ×3 IMPLANT
NDL FENESTRATED 8GA X 6IN (NEEDLE) IMPLANT
NDL FILTER BLUNT 18X1 1/2 (NEEDLE) ×2 IMPLANT
NEEDLE FENESTRATED 8GA X 6IN (NEEDLE) ×3 IMPLANT
NEEDLE FILTER BLUNT 18X 1/2SAF (NEEDLE) ×3
NEEDLE FILTER BLUNT 18X1 1/2 (NEEDLE) ×2 IMPLANT
NS IRRIG 500ML POUR BTL (IV SOLUTION) ×3 IMPLANT
PACK EXTREMITY ARMC (MISCELLANEOUS) ×3 IMPLANT
PAD ABD DERMACEA PRESS 5X9 (GAUZE/BANDAGES/DRESSINGS) ×1 IMPLANT
PENCIL SMOKE EVACUATOR (MISCELLANEOUS) ×3 IMPLANT
SCREW 5.0X36 (Screw) ×1 IMPLANT
SCREW HEADLESS COMP 5.0X40 (Screw) ×1 IMPLANT
SCREW SHORT THREAD 7.0X85 (Screw) ×1 IMPLANT
SCREW SHRT 7.0X90 (Screw) ×1 IMPLANT
SPLINT CAST 1 STEP 4X30 (MISCELLANEOUS) ×1 IMPLANT
SPLINT FAST PLASTER 5X30 (CAST SUPPLIES)
SPLINT PLASTER CAST FAST 5X30 (CAST SUPPLIES) ×2 IMPLANT
STAPLER SKIN PROX 35W (STAPLE) ×1 IMPLANT
STOCKINETTE STRL 6IN 960660 (GAUZE/BANDAGES/DRESSINGS) ×3 IMPLANT
STRIP CLOSURE SKIN 1/4X4 (GAUZE/BANDAGES/DRESSINGS) ×2 IMPLANT
SUT ETHILON 4-0 (SUTURE) ×3
SUT ETHILON 4-0 FS2 18XMFL BLK (SUTURE) ×2
SUT MNCRL AB 3-0 PS2 27 (SUTURE) ×2 IMPLANT
SUT VIC AB 4-0 FS2 27 (SUTURE) ×6 IMPLANT
SUT VICRYL AB 3-0 FS1 BRD 27IN (SUTURE) ×4 IMPLANT
SUTURE ETHLN 4-0 FS2 18XMF BLK (SUTURE) ×2 IMPLANT
SYR 10ML LL (SYRINGE) ×6 IMPLANT
SYR 3ML LL SCALE MARK (SYRINGE) ×3 IMPLANT
WATER STERILE IRR 500ML POUR (IV SOLUTION) ×3 IMPLANT
WEDGE BONE EVANS 22X20X12 (Bone Implant) ×1 IMPLANT
WIRE Z .062 C-WIRE SPADE TIP (WIRE) ×3 IMPLANT

## 2022-03-08 NOTE — Brief Op Note (Signed)
03/08/2022  6:57 PM  PATIENT:  Shawn Harper  26 y.o. male  PRE-OPERATIVE DIAGNOSIS:  FLAT FOOT LEFT  POST-OPERATIVE DIAGNOSIS:  FLAT FOOT LEFT  PROCEDURE:  Procedure(s) with comments: FLAT FOOT RECONSTRUCTION WITH FUSION OF JOINTS IN BACK OF FOOT; POSSIBLE CALF MUSCLE LENGTHENING;BONEGRAFT INTO FOOT TO BUILD ARCH; BONEGRAFT FROM LEG (Left) - POPLITEL AND SAPHENOUS BLOCK APPLICATION OF WOUND VAC  SURGEON:  Surgeon(s) and Role:    * Ashtian Villacis, Stephan Minister, DPM - Primary    * Edrick Kins, DPM    ASSISTANTS: none   ANESTHESIA:   regional and general  EBL:  152m   BLOOD ADMINISTERED:none  DRAINS: Prevena Incisional VAC   LOCAL MEDICATIONS USED:  NONE  SPECIMEN:  No Specimen  DISPOSITION OF SPECIMEN:  N/A  COUNTS:  YES  TOURNIQUET:   Total Tourniquet Time Documented: Ankle (Left) - 22 minutes Ankle (Left) - 105 minutes Total: Ankle (Left) - 127 minutes  Calf (Left) - 41 minutes Total: Calf (Left) - 41 minutes   DICTATION: .Note written in [facility] Discharge to home after PACU  PATIENT DISPOSITION:  PACU - hemodynamically stable.   Delay start of Pharmacological VTE agent (>24hrs) due to surgical blood loss or risk of bleeding: YES

## 2022-03-08 NOTE — Transfer of Care (Signed)
Immediate Anesthesia Transfer of Care Note  Patient: Veda Canning  Procedure(s) Performed: FLAT FOOT RECONSTRUCTION WITH FUSION OF JOINTS IN BACK OF FOOT; POSSIBLE CALF MUSCLE LENGTHENING;BONEGRAFT INTO FOOT TO BUILD ARCH; BONEGRAFT FROM LEG (Left) APPLICATION OF WOUND VAC  Patient Location: PACU  Anesthesia Type:General  Level of Consciousness: awake, drowsy and patient cooperative  Airway & Oxygen Therapy: Patient Spontanous Breathing  Post-op Assessment: Report given to RN and Post -op Vital signs reviewed and stable  Post vital signs: Reviewed and stable  Last Vitals:  Vitals Value Taken Time  BP 116/43   Temp    Pulse 103 03/08/22 1853  Resp    SpO2 97 % 03/08/22 1853  Vitals shown include unvalidated device data.  Last Pain:  Vitals:   03/08/22 1129  TempSrc: Temporal  PainSc: 2          Complications: No notable events documented.

## 2022-03-08 NOTE — Anesthesia Procedure Notes (Addendum)
Procedure Name: Intubation Date/Time: 03/08/2022 2:54 PM  Performed by: Kelton Pillar, CRNAPre-anesthesia Checklist: Patient identified, Emergency Drugs available, Suction available and Patient being monitored Patient Re-evaluated:Patient Re-evaluated prior to induction Oxygen Delivery Method: Circle system utilized Preoxygenation: Pre-oxygenation with 100% oxygen Induction Type: IV induction Ventilation: Mask ventilation without difficulty Laryngoscope Size: McGraph and 3 Grade View: Grade I Tube type: Oral Number of attempts: 1 Airway Equipment and Method: Stylet and Oral airway Placement Confirmation: ETT inserted through vocal cords under direct vision, positive ETCO2, breath sounds checked- equal and bilateral and CO2 detector Secured at: 21 cm Tube secured with: Tape Dental Injury: Teeth and Oropharynx as per pre-operative assessment

## 2022-03-08 NOTE — Anesthesia Preprocedure Evaluation (Addendum)
Anesthesia Evaluation  Patient identified by MRN, date of birth, ID band Patient awake    Reviewed: Allergy & Precautions, NPO status , Patient's Chart, lab work & pertinent test results  Airway Mallampati: II  TM Distance: >3 FB Neck ROM: full    Dental no notable dental hx.    Pulmonary Current SmokerPatient did not abstain from smoking., former smoker,    Pulmonary exam normal        Cardiovascular negative cardio ROS Normal cardiovascular exam     Neuro/Psych PSYCHIATRIC DISORDERS Anxiety negative neurological ROS     GI/Hepatic negative GI ROS, Neg liver ROS,   Endo/Other  negative endocrine ROS  Renal/GU      Musculoskeletal   Abdominal Normal abdominal exam  (+)   Peds  Hematology negative hematology ROS (+)   Anesthesia Other Findings Past Medical History: No date: Anxiety No date: Depression No date: Dysrhythmia No date: Headache No date: Smoker     Comment:  5 pack years, trying to quit  Past Surgical History: No date: LEG SURGERY     Comment:  within a month of birth, in cast for infancy  BMI    Body Mass Index: 21.11 kg/m      Reproductive/Obstetrics negative OB ROS                         Anesthesia Physical Anesthesia Plan  ASA: 2  Anesthesia Plan: General ETT   Post-op Pain Management: Regional block*   Induction: Intravenous  PONV Risk Score and Plan: 2 and Ondansetron, Dexamethasone and Midazolam  Airway Management Planned: Oral ETT  Additional Equipment:   Intra-op Plan:   Post-operative Plan: Extubation in OR  Informed Consent: I have reviewed the patients History and Physical, chart, labs and discussed the procedure including the risks, benefits and alternatives for the proposed anesthesia with the patient or authorized representative who has indicated his/her understanding and acceptance.     Dental Advisory Given  Plan Discussed with:  Anesthesiologist, CRNA and Surgeon  Anesthesia Plan Comments:       Anesthesia Quick Evaluation

## 2022-03-08 NOTE — Discharge Instructions (Addendum)
Post-Surgery Instructions  1. If you are recuperating from surgery anywhere other than home, please be sure to leave [place] a number where you can be reached. 2. Go directly home and rest. 3. The keep operated foot (or feet) elevated six inches above the hip when sitting or lying down. 4. Support the elevated foot and leg with pillows under the calf. DO NOT PLACE PILLOWS UNDER THE KNEE. 5. DO NOT REMOVE or get your bandages wet. This will increase your chances of getting an infection. 6. Wear your surgical shoe at all times when you are up. 7. A limited amount of pain and swelling may occur. The skin may take on a bruised appearance. This is no cause for alarm. 8. For slight pain and swelling, apply an ice pack behind the knee for 15 minutes every hour. Continue icing until seen in the office. DO NOT apply any form of heat to the area. 9. Have prescription(s) filled immediately and take as directed. 10. Drink lots of liquids, water, and juice. 11. CALL THE OFFICE IMMEDIATELY IF: a. Bleeding continues b. Pain increases and/or does not respond to medication c. Bandage or cast appears too tight d. Any liquids (water, coffee, etc.) have spilled on your bandages. e. Tripping, falling, or stubbing the surgical foot f. If your temperature rises above 101 g. If you have ANY questions at all 12. Please use the crutches, knee scooter, or walker you have prescribed, rented, or purchased. If you are non-weight bearing DO NOT put weight on the operated foot for _________ days. If you are weight-bearing, follow your physician's instructions. You are expected to be: ? weight-bearing ? non-weight bearing 13. Special Instructions: _____________________________________________________________ _________________________________________________________________________________ _________________________________________________________________________________  14. Your next appointment is: 03/14/2022 10:15  AM  If you need to reach the nurse for any reason, please call: [place]: [phone] [place]: [phone] [place]: [phone] AMBULATORY SURGERY  DISCHARGE INSTRUCTIONS   The drugs that you were given will stay in your system until tomorrow so for the next 24 hours you should not:  Drive an automobile Make any legal decisions Drink any alcoholic beverage   You may resume regular meals tomorrow.  Today it is better to start with liquids and gradually work up to solid foods.  You may eat anything you prefer, but it is better to start with liquids, then soup and crackers, and gradually work up to solid foods.   Please notify your doctor immediately if you have any unusual bleeding, trouble breathing, redness and pain at the surgery site, drainage, fever, or pain not relieved by medication.    Your post-operative visit with Dr.                                       is: Date:                        Time:    Please call to schedule your post-operative visit.  Additional Instructions:

## 2022-03-08 NOTE — Anesthesia Procedure Notes (Signed)
Anesthesia Regional Block: Popliteal block   Pre-Anesthetic Checklist: , timeout performed,  Correct Patient, Correct Site, Correct Laterality,  Correct Procedure, Correct Position, site marked,  Risks and benefits discussed,  Surgical consent,  Pre-op evaluation,  At surgeon's request and post-op pain management  Laterality: Left and Lower  Prep: chloraprep       Needles:  Injection technique: Single-shot  Needle Type: Stimiplex     Needle Length: 9cm  Needle Gauge: 22     Additional Needles:   Procedures:,,,, ultrasound used (permanent image in chart),,    Narrative:  Start time: 03/08/2022 1:05 PM End time: 03/08/2022 1:10 PM Injection made incrementally with aspirations every 5 mL.  Performed by: Personally  Anesthesiologist: Iran Ouch, MD  Additional Notes: Patient consented for risk and benefits of nerve block including but not limited to nerve damage, failed block, bleeding and infection.  Patient voiced understanding.  Functioning IV was confirmed and monitors were applied.  Timeout done prior to procedure and prior to any sedation being given to the patient.  Patient confirmed procedure site prior to any sedation given to the patient.  A 7m 22ga Stimuplex needle was used. Sterile prep,hand hygiene and sterile gloves were used.  Minimal sedation used for procedure.  No paresthesia endorsed by patient during the procedure.  Negative aspiration and negative test dose prior to incremental administration of local anesthetic. The patient tolerated the procedure well with no immediate complications.

## 2022-03-08 NOTE — H&P (Deleted)
  The note originally documented on this encounter has been moved the the encounter in which it belongs.  

## 2022-03-08 NOTE — Anesthesia Procedure Notes (Signed)
Anesthesia Regional Block: Adductor canal block   Pre-Anesthetic Checklist: , timeout performed,  Correct Patient, Correct Site, Correct Laterality,  Correct Procedure, Correct Position, site marked,  Risks and benefits discussed,  Surgical consent,  Pre-op evaluation,  At surgeon's request and post-op pain management  Laterality: Left and Lower  Prep: chloraprep       Needles:  Injection technique: Single-shot  Needle Type: Stimiplex     Needle Length: 9cm  Needle Gauge: 22     Additional Needles:   Procedures:,,,, ultrasound used (permanent image in chart),,    Narrative:  Start time: 03/08/2022 1:00 PM End time: 03/08/2022 1:04 PM Injection made incrementally with aspirations every 5 mL.  Performed by: Personally  Anesthesiologist: Iran Ouch, MD  Additional Notes: Patient consented for risk and benefits of nerve block including but not limited to nerve damage, failed block, bleeding and infection.  Patient voiced understanding.  Functioning IV was confirmed and monitors were applied.  Timeout done prior to procedure and prior to any sedation being given to the patient.  Patient confirmed procedure site prior to any sedation given to the patient. Sterile prep,hand hygiene and sterile gloves were used.  Minimal sedation used for procedure.  No paresthesia endorsed by patient during the procedure.  Negative aspiration and negative test dose prior to incremental administration of local anesthetic. The patient tolerated the procedure well with no immediate complications.

## 2022-03-08 NOTE — H&P (Signed)
History and Physical Interval Note:  03/08/2022 2:00 PM  Shawn Harper  has presented today for surgery, with the diagnosis of Flat foot, coalition, arthritis, equinus.  The various methods of treatment have been discussed with the patient and family. After consideration of risks, benefits and other options for treatment, the patient has consented to hindfoot fusion, flat foot reconstruction, Achilles tendon lengthening as a surgical intervention.  The patient's history has been reviewed, patient examined, no change in status, stable for surgery.  I have reviewed the patient's chart and labs.  Questions were answered to the patient's satisfaction.     [name]

## 2022-03-09 NOTE — Op Note (Signed)
Patient Name: Shawn Harper DOB: 12-Apr-1996  MRN: [id number]   Date of Service: 03/08/2022  Surgeon: Dr. 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 Item Serial No. Manufacturer Lot No. LRB No. Used Action  Allomatrix C Bone Putty with DBM Bone Implant  [id number] [employer]  Left 1 Implanted  Allopure Bicortical Wedge  Bone Implant  [id number] [employer]  Left 1 Implanted  7.0 x 90 screw    [employer]  Left 1 Implanted  SCREW SHORT THREAD 7.0X85 - [id number] Screw SCREW SHORT THREAD 7.0X85  [employer]  Left 1 Implanted  SCREW HEADLESS COMP 5.0X40 - [id number] Screw SCREW HEADLESS COMP 5.0X40  [employer]  Left 1 Implanted  5.0 x 36 screw    [employer]  Left 1 Implanted  BONE [facility] CHIPS [facility] PCAN1/4 - [id number] Bone Implant BONE [facility] CHIPS [facility] PCAN1/4 [id number] [facility]  Left 1 Implanted   Medications: none Complications: none  Indications for Procedure:  This is a 26 y.o. male with a history of pes planus deformity and equinus. MRI revealed a talocalcaneal coalition and arthritis of the hindfoot. After failing non operative treatment he elected for operative intervention.     Procedure in Detail: Patient was identified in pre-operative holding area. Formal consent was signed and the left lower extremity was marked. Patient was brought back to the operating room. Anesthesia was induced. The extremity was prepped and draped in the usual sterile fashion. Timeout was taken to confirm patient name, laterality, and procedure prior to incision.   Attention was then directed to the left lower extremity where a modified Ollier's incision approach of the subtalar joint was created from the tip of the fibula to the base of the fourth metatarsal.  This was carried deep through subcutaneous tissues, cauterizing bleeding vessels as necessary.  The peroneal tendons were readily visible as well as the peroneus tertius as they had completely subluxed from their groove in the fibula due to the amount of the deformity.  The calcaneus was in significant hindfoot valgus, finding the sinus tarsi and subtalar joint was quite difficult due to the amount of deformity as well as the coalition.  Once the joint was found and entered the lateral and posterior portions of the subtalar capsule were released along with the bifurcate and cervical ligaments.  The osteotome and sagittal saw were used to resect the talocalcaneal coalition.  Complete opening of the joint was not possible and this necessitated additional approach from the medial incision.  I then directed attention medially and made an incision from the tip of the navicular tuberosity to the medial malleolus.  This was carried deep through subcutaneous tissue, cauterizing bleeders as necessary.  The posterior tibial tendon was reflected inferiorly and the spring ligament was released to expose the talonavicular joint.  I then carried dissection proximally  and to the level of the middle facet.  The osteotome was passed through this to identify the level of the coalition.  A sagittal saw at this location was then used to resect further coalition.  All  bony and cartilaginous debris was then removed with a rongeur and the joint was thoroughly irrigated.  Minimal cartilage remaining in the subtalar joint.  What remained was then resected using a combination of osteotome, rongeur and curettes.  I then directed my attention to the talonavicular joint.  Osteotomes and curettes were used to resect the articular cartilage.  This was then thoroughly irrigated.  All joint surfaces were then fenestrated using a 2.0 mm drill bit.  A small incision was then made over the medial tibia.  A Jamshidi needle was then used to harvest 20 cc of bone marrow aspirate.  This was mixed with demineralized bone matrix, cancellous chips as graft.  Some of the bone marrow aspirate was then also mixed with a 12 mm allograft wedge for the subtalar joint.  The foot was then positioned and reduced appropriately.  This necessitated a tendo Achilles lengthening which was done in a Hoke triple hemisection fashion percutaneously.  This allowed the subtalar joint and calcaneus to be mobilized and the proper alignment.  The wedge was then placed into the subtalar joint and guidewires for the 7.0 millimeter screws were then placed across this.  Once proper position and correction was achieved the screws were drilled and inserted.  The bone graft mixture was then packed around the site.  We then directed attention to the talonavicular joint where the remaining bone graft was packed into position, the forefoot was then reduced out of abduction and varus and into the proper position.  It was fixated with guidewires for the 5.0 millimeter screws.  Once proper position and correction was achieved the screws were drilled and inserted with good compression noted across both arthrodesis sites.  All wounds were then thoroughly irrigated and closed in layers with 2-0 Vicryl, 3-0 Monocryl, 3-0 nylon and skin staples.  The foot was then dressed with Xeroform dry sterile dressings over the medial incisions, the  lateral incision was dressed with a Prevena negative pressure wound therapy device and a well-padded below the knee posterior and saddle splint. Patient tolerated the procedure well.   Disposition: Following a period of post-operative monitoring, patient will be transferred to home.

## 2022-03-09 NOTE — Anesthesia Postprocedure Evaluation (Signed)
Anesthesia Post Note  Patient: Shawn Harper  Procedure(s) Performed: FLAT FOOT RECONSTRUCTION WITH FUSION OF JOINTS IN BACK OF FOOT; POSSIBLE CALF MUSCLE LENGTHENING;BONEGRAFT INTO FOOT TO BUILD ARCH; BONEGRAFT FROM LEG (Left) APPLICATION OF WOUND VAC  Patient location during evaluation: PACU Anesthesia Type: General Level of consciousness: awake and alert Pain management: pain level controlled Vital Signs Assessment: post-procedure vital signs reviewed and stable Respiratory status: spontaneous breathing, nonlabored ventilation, respiratory function stable and patient connected to nasal cannula oxygen Cardiovascular status: blood pressure returned to baseline and stable Postop Assessment: no apparent nausea or vomiting Anesthetic complications: no   No notable events documented.   Last Vitals:  Vitals:   03/08/22 1945 03/08/22 1950  BP: (!) 111/51 103/62  Pulse: (!) 54 (!) 59  Resp: 14 16  Temp:  (!) 36.3 C  SpO2: 98% 96%    Last Pain:  Vitals:   03/08/22 1950  TempSrc: Temporal  PainSc: 0-No pain                 Martha Clan

## 2022-03-10 ENCOUNTER — Telehealth: Payer: Self-pay | Admitting: Podiatry

## 2022-03-10 MED ORDER — KETOROLAC TROMETHAMINE 10 MG PO TABS: 10.0000 mg | ORAL_TABLET | Freq: Four times a day (QID) | ORAL | 0 refills | Status: AC | PRN

## 2022-03-10 MED ORDER — HYDROMORPHONE HCL 2 MG PO TABS: 2.0000 mg | ORAL_TABLET | ORAL | 0 refills | Status: AC | PRN

## 2022-03-10 MED ORDER — DOCUSATE SODIUM 100 MG PO CAPS: 100.0000 mg | ORAL_CAPSULE | Freq: Two times a day (BID) | ORAL | 0 refills | Status: AC

## 2022-03-10 NOTE — Telephone Encounter (Signed)
Spoke with patient's wife, they have been taking medication as directed Tylenol 1000 mg every 6 hours, gabapentin 300 mg every 8 hours, ibuprofen 600 mg every 8 hours, aspirin twice daily.  Has not a bowel movement either they have taken OTC stool softener.  Advised to keep icing and elevating, I sent Rx for Colace, Toradol and hydromorphone.  They will stop the oxycodone and ibuprofen and aspirin for now until pain is improved.

## 2022-03-11 ENCOUNTER — Ambulatory Visit (INDEPENDENT_AMBULATORY_CARE_PROVIDER_SITE_OTHER): Payer: Medicaid Other

## 2022-03-11 ENCOUNTER — Ambulatory Visit (INDEPENDENT_AMBULATORY_CARE_PROVIDER_SITE_OTHER): Payer: Medicaid Other | Admitting: Podiatry

## 2022-03-11 ENCOUNTER — Encounter: Payer: Self-pay | Admitting: Podiatry

## 2022-03-11 DIAGNOSIS — Z09 Encounter for follow-up examination after completed treatment for conditions other than malignant neoplasm: Secondary | ICD-10-CM

## 2022-03-11 DIAGNOSIS — Q6689 Other  specified congenital deformities of feet: Secondary | ICD-10-CM

## 2022-03-11 DIAGNOSIS — Q6652 Congenital pes planus, left foot: Secondary | ICD-10-CM | POA: Diagnosis not present

## 2022-03-11 DIAGNOSIS — M19072 Primary osteoarthritis, left ankle and foot: Secondary | ICD-10-CM

## 2022-03-11 NOTE — Progress Notes (Signed)
  Subjective:  Patient ID: Shawn Harper, male    DOB: 1996-05-30,  MRN: [id number]  Chief Complaint  Patient presents with   Routine Post Op    POV # DOS 03/08/22 --- LEFT FLATFOOT RECONSTRUCTION WITH FUSION OF JOINTS IN BACK OF FOOT, POSSIBLE CALF MUSCLE LENGHTENING, BONE GRAFTINTO FAT TO BUILD ARCH , BONE GRAFT FROM LEG     26 y.o. male returns for post-op check.  Presents early for postop visit #1, the wound VAC has been having complications and pain has been an issue for him, the Toradol did help quite a bit  Review of Systems: Negative except as noted in the HPI. Denies N/V/F/Ch.   Objective:  There were no vitals filed for this visit. There is no height or weight on file to calculate BMI. Constitutional Well developed. Well nourished.  Vascular Foot warm and well perfused. Capillary refill normal to all digits.  Calf is soft and supple, no posterior calf or knee pain, negative Homans' sign  Neurologic Normal speech. Oriented to person, place, and time. Epicritic sensation to light touch grossly present bilaterally.  Dermatologic Some maceration of her lateral incision but is well coapted no signs of infection in any incision, mild oozing  Orthopedic: Tenderness to palpation noted about the surgical site.  Mild edema hardware   Multiple view plain film radiographs: Intact and in good position with good correction noted Assessment:   1. Postop check   2. Pes planus, rigid, left   3. Coalition, talocalcaneal    Plan:  Patient was evaluated and treated and all questions answered.  S/p foot surgery left -Incisions appear to be healing well.  The wound VAC was discontinued and a sterile dressing with Betadine paint, dry sterile dressings and a well-padded below the knee fiberglass cast was applied -XR: Noted above -WB Status: NWB in below-knee cast -Sutures: Removed in 2 weeks. -Medications: No refills required.  He still has not had a bowel movement, he started taking  Colace yesterday.  They will begin MiraLAX today.  Advised to let me know if it does not improve.  Continue Toradol and Dilaudid now for pain control, may reduce this as it improves over the next week back to oxycodone and ibuprofen -Foot redressed.  No follow-ups on file.

## 2022-03-12 ENCOUNTER — Encounter: Payer: Self-pay | Admitting: Podiatry

## 2022-03-14 ENCOUNTER — Encounter: Payer: Medicaid Other | Admitting: Podiatry

## 2022-03-21 ENCOUNTER — Other Ambulatory Visit: Payer: Self-pay | Admitting: Podiatry

## 2022-03-21 ENCOUNTER — Other Ambulatory Visit: Payer: Self-pay

## 2022-03-22 NOTE — Telephone Encounter (Signed)
Patient's wife is calling for a refill of Gabapentin-300 mg per husband's request, still having nerve pain which keeps him up at night. Please advise.

## 2022-03-24 MED ORDER — GABAPENTIN 300 MG PO CAPS: 300.0000 mg | ORAL_CAPSULE | Freq: Three times a day (TID) | ORAL | 0 refills | Status: DC

## 2022-03-26 ENCOUNTER — Encounter: Payer: Self-pay | Admitting: Podiatry

## 2022-03-26 ENCOUNTER — Telehealth: Payer: Self-pay | Admitting: Podiatry

## 2022-03-26 MED ORDER — HYDROMORPHONE HCL 2 MG PO TABS: 2.0000 mg | ORAL_TABLET | ORAL | 0 refills | Status: DC | PRN

## 2022-03-26 NOTE — Addendum Note (Signed)
Addended bySherryle Lis, Kaidence Sant R on: 03/26/2022 12:29 PM   Modules accepted: Orders

## 2022-03-26 NOTE — Telephone Encounter (Signed)
Shawn Harper said he is still in a lot of pain and would like a refill on his Hydromophone, he said the gabapentin is not working.

## 2022-03-27 ENCOUNTER — Telehealth: Payer: Self-pay

## 2022-03-27 NOTE — Telephone Encounter (Signed)
HYDROmorphone HCl '2MG'$  tablets  Prior authorization was started today on 03/27/22.

## 2022-03-27 NOTE — Telephone Encounter (Signed)
Per patient insurance and prior authorization was not required for this medication.

## 2022-03-28 ENCOUNTER — Ambulatory Visit (INDEPENDENT_AMBULATORY_CARE_PROVIDER_SITE_OTHER): Payer: Medicaid Other | Admitting: Podiatry

## 2022-03-28 DIAGNOSIS — Q6652 Congenital pes planus, left foot: Secondary | ICD-10-CM

## 2022-03-28 DIAGNOSIS — R2689 Other abnormalities of gait and mobility: Secondary | ICD-10-CM

## 2022-03-28 DIAGNOSIS — Q6689 Other  specified congenital deformities of feet: Secondary | ICD-10-CM | POA: Diagnosis not present

## 2022-03-28 MED ORDER — HYDROMORPHONE HCL 2 MG PO TABS: 2.0000 mg | ORAL_TABLET | ORAL | 0 refills | Status: DC | PRN

## 2022-03-28 MED ORDER — CEPHALEXIN 500 MG PO CAPS
500.0000 mg | ORAL_CAPSULE | Freq: Three times a day (TID) | ORAL | 0 refills | Status: DC
Start: 2022-03-28 — End: 2023-02-19

## 2022-03-28 MED ORDER — CEPHALEXIN 500 MG PO CAPS
500.0000 mg | ORAL_CAPSULE | Freq: Three times a day (TID) | ORAL | 0 refills | Status: DC
Start: 2022-03-28 — End: 2022-03-28

## 2022-03-28 NOTE — Progress Notes (Signed)
Order and script were faxed to [facility] clinic and the patient was notified.

## 2022-03-28 NOTE — Progress Notes (Signed)
  Subjective:  Patient ID: Shawn Harper, male    DOB: [birth year],  MRN: [id number]  Chief Complaint  Patient presents with   Routine Post Op    POV #2 DOS 03/08/22 --- LEFT FLATFOOT RECONSTRUCTION WITH FUSION OF JOINTS IN BACK OF FOOT, POSSIBLE CALF MUSCLE LENGHTENING, BONE GRAFTINTO FAT TO BUILD ARCH , BONE GRAFT FROM LEG     26 y.o. male returns for post-op check.  Returns for follow-up he is doing well he still having quite a bit of pain and having take the hydromorphone but is trying to decrease it as quickly as he can.  Review of Systems: Negative except as noted in the HPI. Denies N/V/F/Ch.   Objective:  There were no vitals filed for this visit. There is no height or weight on file to calculate BMI. Constitutional Well developed. Well nourished.  Vascular Foot warm and well perfused. Capillary refill normal to all digits.  Calf is soft and supple, no posterior calf or knee pain, negative Homans' sign  Neurologic Normal speech. Oriented to person, place, and time. Epicritic sensation to light touch grossly present bilaterally.  Dermatologic No drainage today, incisions well coapted and appear to be well-healed nonhypertrophic staples and sutures intact.  There is an erythematous rash on the plantar mid arch  Orthopedic: Tenderness to palpation noted about the surgical site.  Mild edema    Multiple view plain film radiographs: Intact and in good position with good correction noted Assessment:   1. Pes planus, rigid, left   2. Coalition, talocalcaneal    Plan:  Patient was evaluated and treated and all questions answered.  S/p foot surgery left -Overall incisions are well-healed.  He did have a new erythematous rash that was consistent with a cellulitis but I did put him on Keflex as a precaution.  He will take Benadryl for this as well.  Refill of hydromorphone also sent to pharmacy for pain control.  Continue ice and elevation.  Nearly all staples removed except for  posterior heel.  Steri-Strips applied.  I will see him back in 3 weeks for cast removal and transition to CAM boot which will be nonweightbearing, Rx for this was sent to [facility] clinic.  We will plan to transition to the cam boot and remain nonweightbearing after the next visit  Return in about 3 weeks (around 04/18/2022) for cast removal .

## 2022-03-29 ENCOUNTER — Encounter: Payer: Self-pay | Admitting: Podiatry

## 2022-03-31 ENCOUNTER — Other Ambulatory Visit: Payer: Self-pay | Admitting: Podiatry

## 2022-04-01 ENCOUNTER — Other Ambulatory Visit: Payer: Self-pay | Admitting: Podiatry

## 2022-04-02 MED ORDER — GABAPENTIN 300 MG PO CAPS: 300.0000 mg | ORAL_CAPSULE | Freq: Three times a day (TID) | ORAL | 0 refills | Status: DC

## 2022-04-02 MED ORDER — HYDROMORPHONE HCL 2 MG PO TABS: 2.0000 mg | ORAL_TABLET | ORAL | 0 refills | Status: AC | PRN

## 2022-04-09 DIAGNOSIS — M79676 Pain in unspecified toe(s): Secondary | ICD-10-CM

## 2022-04-18 ENCOUNTER — Ambulatory Visit (INDEPENDENT_AMBULATORY_CARE_PROVIDER_SITE_OTHER): Payer: Medicaid Other | Admitting: Podiatry

## 2022-04-18 ENCOUNTER — Ambulatory Visit (INDEPENDENT_AMBULATORY_CARE_PROVIDER_SITE_OTHER): Payer: Medicaid Other

## 2022-04-18 DIAGNOSIS — M958 Other specified acquired deformities of musculoskeletal system: Secondary | ICD-10-CM

## 2022-04-18 DIAGNOSIS — Q6689 Other  specified congenital deformities of feet: Secondary | ICD-10-CM

## 2022-04-18 DIAGNOSIS — Q6652 Congenital pes planus, left foot: Secondary | ICD-10-CM

## 2022-04-18 MED ORDER — GABAPENTIN 300 MG PO CAPS: 300.0000 mg | ORAL_CAPSULE | Freq: Three times a day (TID) | ORAL | 0 refills | Status: DC

## 2022-04-18 MED ORDER — HYDROMORPHONE HCL 2 MG PO TABS: 2.0000 mg | ORAL_TABLET | Freq: Two times a day (BID) | ORAL | 0 refills | Status: AC | PRN

## 2022-04-22 NOTE — Progress Notes (Signed)
  Subjective:  Patient ID: Shawn Harper, male    DOB: Sep 21, 1995,  MRN: [id number]  Chief Complaint  Patient presents with   Routine Post Op    ( Cast removal)(xray)POV #3 DOS 03/08/22 --- LEFT FLATFOOT RECONSTRUCTION WITH FUSION OF JOINTS IN BACK OF FOOT, POSSIBLE CALF MUSCLE LENGHTENING, BONE GRAFTINTO FAT TO BUILD ARCH , BONE GRAFT FROM LEG     26 y.o. male returns for post-op check.  Doing well pain continues to improve.  He is taking the gabapentin, only occasionally taking hydromorphone  Review of Systems: Negative except as noted in the HPI. Denies N/V/F/Ch.   Objective:  There were no vitals filed for this visit. There is no height or weight on file to calculate BMI. Constitutional Well developed. Well nourished.  Vascular Foot warm and well perfused. Capillary refill normal to all digits.  Calf is soft and supple, no posterior calf or knee pain, negative Homans' sign  Neurologic Normal speech. Oriented to person, place, and time. Epicritic sensation to light touch grossly present bilaterally.  Dermatologic Incisions are well-healed and not hypertrophic, no drainage no signs of infection  Orthopedic: Edema is improving.  Still has significant tenderness around subtalar joint   Multiple view plain film radiographs: Good early fusion across arthrodesis sites noted  Assessment:   1. Pes planus, rigid, left   2. Coalition, talocalcaneal   3. Osteochondral defect of ankle    Plan:  Patient was evaluated and treated and all questions answered.  S/p foot surgery left -Continues to improve.  He transition to the cam walker boot today and will remain nonweightbearing for an additional 10 days.  At that point he may begin weightbearing in the cam boot.  I will see him back in 6 weeks for new x-rays, will plan for gradual transition to shoe gear after that.  He did request final refill of his pain medication, he is only taking occasionally and I expect he will need some of this as  he transitions over the next few weeks back to weightbearing in regular shoe gear Return in about 6 weeks (around 05/30/2022) for post op (new x-rays).

## 2022-04-23 ENCOUNTER — Telehealth: Payer: Self-pay

## 2022-04-23 NOTE — Telephone Encounter (Signed)
Spoke to the patient and he verbalized understanding of the information given.

## 2022-04-25 ENCOUNTER — Other Ambulatory Visit: Payer: Self-pay

## 2022-04-25 MED ORDER — HYDROMORPHONE HCL 2 MG PO TABS: 2.0000 mg | ORAL_TABLET | Freq: Four times a day (QID) | ORAL | 0 refills | Status: AC | PRN

## 2022-05-06 ENCOUNTER — Encounter: Payer: Self-pay | Admitting: *Deleted

## 2022-05-30 ENCOUNTER — Ambulatory Visit (INDEPENDENT_AMBULATORY_CARE_PROVIDER_SITE_OTHER): Payer: Medicaid Other | Admitting: Podiatry

## 2022-05-30 ENCOUNTER — Ambulatory Visit (INDEPENDENT_AMBULATORY_CARE_PROVIDER_SITE_OTHER): Payer: Medicaid Other

## 2022-05-30 DIAGNOSIS — Q6689 Other  specified congenital deformities of feet: Secondary | ICD-10-CM

## 2022-05-30 DIAGNOSIS — Q6652 Congenital pes planus, left foot: Secondary | ICD-10-CM

## 2022-05-30 NOTE — Patient Instructions (Signed)
You may begin walking in the ankle brace and a supportive shoe like your Asics with the inserts. You may walk as much as you can tolerate. Start physical therapy ASAP    Call to schedule physical therapy: [facility] at [facility]  [phone]

## 2022-05-30 NOTE — Progress Notes (Signed)
  Subjective:  Patient ID: Shawn Harper, male    DOB: 05-Mar-1996,  MRN: [id number]  Chief Complaint  Patient presents with   Routine Post Op    POV #4DOS 03/08/22 --- LEFT FLATFOOT RECONSTRUCTION WITH FUSION OF JOINTS IN BACK OF FOOT, POSSIBLE CALF MUSCLE LENGHTENING, BONE GRAFTINTO FAT TO BUILD ARCH , BONE GRAFT FROM LEG     26 y.o. male returns for post-op check.  He is doing well.  Pain is improved quite a bit as well as swelling.  He is still walking some with crutches.  Has discomfort especially into the toes  Review of Systems: Negative except as noted in the HPI. Denies N/V/F/Ch.   Objective:  There were no vitals filed for this visit. There is no height or weight on file to calculate BMI. Constitutional Well developed. Well nourished.  Vascular Foot warm and well perfused. Capillary refill normal to all digits.  Calf is soft and supple, no posterior calf or knee pain, negative Homans' sign  Neurologic Normal speech. Oriented to person, place, and time. Epicritic sensation to light touch grossly present bilaterally.  Dermatologic Incisions are well-healed and not hypertrophic  Orthopedic: Soft tissue swelling significantly improved.  He does have some discomfort in the plantar arch and into the toes.   Multiple view plain film radiographs: Full consolidation of fusion across arthrodesis sites noted, no complication of hardware still has some valgus deformity Assessment:   1. Pes planus, rigid, left   2. Coalition, talocalcaneal    Plan:  Patient was evaluated and treated and all questions answered.  S/p foot surgery left -Overall doing very well now nearly 3 months out from his double arthrodesis with subtalar graft.  His position is improved significantly but does still have residual valgus.  I discussed with him that I do not think it is likely possible to get him much further into a varus position.  I think it would be best to begin transitioning to regular shoe gear  with a Tri-Lock ankle brace.  This was dispensed today.  I also recommended power step orthoses and these were dispensed today as well.  Discussed shoe gear and he has Asics that appear to be supportive enough for now.  Begin physical therapy and I recommended he start this at [facility] across the street.  We will follow-up in 3 months.  Eventually he may need custom molded orthoses  Return in about 3 months (around 08/30/2022).

## 2022-06-05 ENCOUNTER — Ambulatory Visit: Payer: Medicaid Other | Attending: Family Medicine | Admitting: Physical Therapy

## 2022-06-05 ENCOUNTER — Encounter: Payer: Self-pay | Admitting: Physical Therapy

## 2022-06-05 ENCOUNTER — Other Ambulatory Visit: Payer: Self-pay

## 2022-06-05 DIAGNOSIS — M6281 Muscle weakness (generalized): Secondary | ICD-10-CM | POA: Insufficient documentation

## 2022-06-05 DIAGNOSIS — Q6689 Other  specified congenital deformities of feet: Secondary | ICD-10-CM | POA: Diagnosis not present

## 2022-06-05 DIAGNOSIS — Q6652 Congenital pes planus, left foot: Secondary | ICD-10-CM | POA: Diagnosis not present

## 2022-06-05 DIAGNOSIS — R2689 Other abnormalities of gait and mobility: Secondary | ICD-10-CM | POA: Insufficient documentation

## 2022-06-05 DIAGNOSIS — M25572 Pain in left ankle and joints of left foot: Secondary | ICD-10-CM | POA: Insufficient documentation

## 2022-06-05 NOTE — Patient Instructions (Signed)
Access Code: [email or web address] URL: [email or web address] Date: 06/05/2022 Prepared by: Hilda Blades  Exercises - Long Sitting Calf Stretch with Strap  - 2-3 x daily - 3 reps - 30 seconds hold - Long Sitting Ankle Plantar Flexion with Resistance  - 1 x daily - 3 sets - 15 reps - Long Sitting Ankle Dorsiflexion with Anchored Resistance  - 1 x daily - 3 sets - 15 reps - Seated Ankle Inversion Eversion PROM  - 2-3 x daily - 10 reps - 10 seconds hold - Seated Toe Flexion Extension PROM  - 2-3 x daily - 10 reps - 10 seconds hold - Seated Heel Raise  - 2-3 x daily - 3 sets - 15 reps

## 2022-06-05 NOTE — Therapy (Signed)
OUTPATIENT PHYSICAL THERAPY EVALUATION   Patient Name: Shawn Harper MRN: [id number] DOB:1996-01-14, 26 y.o., male Today's Date: 06/05/2022   PT End of Session - 06/05/22 0932     Visit Number 1    Number of Visits 17    Date for PT Re-Evaluation 07/31/22    Authorization Type [employer]    PT Start Time 0916    PT Stop Time 1000    PT Time Calculation (min) 44 min    Activity Tolerance Patient tolerated treatment well    Behavior During Therapy WFL for tasks assessed/performed             Past Medical History:  Diagnosis Date   Anxiety    Depression    Dysrhythmia    Headache    Smoker    5 pack years, trying to quit   Past Surgical History:  Procedure Laterality Date   APPLICATION OF WOUND VAC  03/08/2022   Procedure: APPLICATION OF WOUND VAC;  Surgeon: [name], DPM;  Location: [facility] OR[facility];  Service: Podiatry;;   FLAT FOOT RECONSTRUCTION-TAL GASTROC RECESSION Left 03/08/2022   Procedure: FLAT FOOT RECONSTRUCTION WITH FUSION OF JOINTS IN BACK OF FOOT; POSSIBLE CALF MUSCLE LENGTHENING;BONEGRAFT INTO FOOT TO BUILD ARCH; BONEGRAFT FROM LEG;  Surgeon: [name], DPM;  Location: [facility] OR[facility];  Service: Podiatry;  Laterality: Left;  POPLITEL AND SAPHENOUS BLOCK   LEG SURGERY     within a month of birth, in cast for infancy   Patient Active Problem List   Diagnosis Date Noted   Coalition, talocalcaneal    Osteoarthritis of left ankle and foot    Smoker 01/28/2021   Unprotected sex 10/07/2014   GAD (generalized anxiety disorder) 10/07/2014   Depression 10/07/2014   Former smoker     PCP: Marin Olp, MD  REFERRING PROVIDER: [name], DPM  REFERRING DIAG: Pes planus, rigid, left; Coalition, talocalcaneal  THERAPY DIAG:  Pain in left ankle and joints of left foot  Muscle weakness (generalized)  Other abnormalities of gait and mobility  Rationale for Evaluation and Treatment Rehabilitation  ONSET DATE: 03/08/2022   SUBJECTIVE:   SUBJECTIVE STATEMENT: He had surgery to correct left flat foot and alignment, and achilles lengthening. Patient reports his foot feels worse after the surgery than before. The only way he can walk is with the boot due to pain. He can walk 20-30 ft without the boot but pain will become intense. Pain is mainly located to the bottom of the foot due to pressure placed on bony areas. Patient previously was hiking a lot and enjoys working out.  PERTINENT HISTORY: Flat foot reconstruction with subtalar and talonavicular fusion, achilles lengthening   PAIN:  Are you having pain? Yes:  NPRS scale: 1/10 (8-10/10 with walking) Pain location: Left foot, arch and plantar aspect Pain description: "feels like a fracture through the bone and pressure" Aggravating factors: Walking, standing (weight bearing) Relieving factors: Rest, sitting  PRECAUTIONS: None  WEIGHT BEARING RESTRICTIONS: No  FALLS:  Has patient fallen in last 6 months? No  PLOF: Independent  PATIENT GOALS: Walking normally without pain, hiking, working out   OBJECTIVE:  PATIENT SURVEYS:  LEFS 33/80  COGNITION: Overall cognitive status: Within functional limits for tasks assessed     SENSATION: WFL  MUSCLE LENGTH: Calf flexibility deficit  POSTURE:    Patient rests in 5 deg of eversion  PALPATION:  Tender to plantar aspect of foot especially around navicular and met head regions  LOWER EXTREMITY ROM:  Active ROM Right eval Left eval  Ankle dorsiflexion - 4 lacking  Ankle plantarflexion - 30 deg  Ankle inversion - 0  Ankle eversion - 5   LOWER EXTREMITY MMT:  MMT Right eval Left eval  Ankle dorsiflexion - 4  Ankle plantarflexion - 2  Ankle inversion - -  Ankle eversion - -   GAIT: Distance walked: 30 ft Assistive device utilized: None Level of assistance: Complete Independence Comments: patient ambulating with CAM boot and ambulation not assessed without boot, antalgic gait on left   TODAY'S  TREATMENT: [facility] Adult PT Treatment:                                                DATE: 06/05/2022 Therapeutic Exercise: Longsitting calf stretch 3 x 30 sec Banded ankle PF / DF with red x15 each Seated ankle PF stretch 3 x 10 sec Seated toe flex / ext stretch 3 x 10 sec each Seated heel toe raises x 15  PATIENT EDUCATION:  Education details: Exam findings, POC, HEP, gradual weaning from boot, gel insert Person educated: Patient Education method: Explanation, Demonstration, Tactile cues, Verbal cues, and Handouts Education comprehension: verbalized understanding, returned demonstration, verbal cues required, tactile cues required, and needs further education  HOME EXERCISE PROGRAM: Access Code: [email or web address]   ASSESSMENT: CLINICAL IMPRESSION: Patient is a 26 y.o. male who was seen today for physical therapy evaluation and treatment for chronic left ankle and foot pain that is now status post left flat foot reconstruction and achilles lengthening, with subtalar and talonavicular fusions. He has been cleared to be WBAT in normal footwear but is currently using CAM boot due to pain. He demonstrates significant limitations in left ankle, foot, and toe range of motion, strength deficits of the left ankle, and poor tolerance for weight bearing on the left foot and ankle.    OBJECTIVE IMPAIRMENTS: Abnormal gait, decreased activity tolerance, decreased balance, difficulty walking, decreased ROM, decreased strength, impaired flexibility, and pain.   ACTIVITY LIMITATIONS: standing, squatting, stairs, and locomotion level  PARTICIPATION LIMITATIONS: meal prep, cleaning, shopping, community activity, occupation, and yard work  PERSONAL FACTORS: Past/current experiences and Time since onset of injury/illness/exacerbation are also affecting patient's functional outcome.   REHAB POTENTIAL: Good  CLINICAL DECISION MAKING: Stable/uncomplicated  EVALUATION COMPLEXITY: Low   GOALS: Goals reviewed  with patient? Yes  SHORT TERM GOALS: Target date: 07/03/2022   Patient will be I with initial HEP in order to progress with therapy. Baseline: HEP provided at eval Goal status: INITIAL  2.  Patient will demonstrate >/= 0 deg left ankle DF to improve gait with normal footwear to wean from CAM boot and allow for improve household ambulation Baseline: lacking 4 deg Goal status: INITIAL  3.  Patient will report left ankle and foot pain </= 5/10 with weight bearing using normal footwear to improved standing and walking ability  Baseline: 8-10/10 pain with walking Goal status: INITIAL  LONG TERM GOALS: Target date: 07/31/2022   Patient will be I with final HEP to maintain progress from PT. Baseline: HEP provided at eval Goal status: INITIAL  2.  Patient will report LEFS >/= 60/80 in order to indicate improved functional status and return to walking and household activities without limitation Baseline: 33/80 Goal status: INITIAL  3.  Patient will demonstrate >/= 5 deg left ankle DF to  allow for improved gait mechanics and stair negotiation for improved community access Baseline: lacking 4 deg Goal status: INITIAL  4.  Patient will exhibit left ankle strength grossly >/= 4/5 MMT within available ranges to improve walking and standing tolerance so patient can return to prior activities such as hiking Baseline: patient demonstrates significant ankle strength deficits Goal status: INITIAL  5.  Patient will report left ankle pain </= 2/10 with walking community level distances in order to improve community access and grocery shopping ability Baseline: 8-10/10 pain with walking Goal status: INITIAL   PLAN: PT FREQUENCY: 1-2x/week  PT DURATION: 8 weeks  PLANNED INTERVENTIONS: Therapeutic exercises, Therapeutic activity, Neuromuscular re-education, Balance training, Gait training, Patient/Family education, Self Care, Joint mobilization, Joint manipulation, Stair training, Aquatic  Therapy, Dry Needling, Electrical stimulation, Cryotherapy, Moist heat, Taping, Ionotophoresis 81m/ml Dexamethasone, Manual therapy, and Re-evaluation  PLAN FOR NEXT SESSION: Review HEP and progress PRN, manual/mobs for talocrural and improving ankle and calf mobility, progress ankle strengthening, gait training and balance PRN, aquatics?   CHilda Blades PT, DPT, LAT, ATC 06/05/22  2:18 PM Phone: [phone]: [phone]  Check all possible CPT codes: 962831- PT Re-evaluation, 97110- Therapeutic Exercise, [phone] Neuro Re-education, [id number]- Gait Training, [phone]- Manual Therapy, 97530 - Therapeutic Activities, [id number]- Self Care, 97014 - Electrical stimulation (unattended), [id number]- Iontophoresis, and [id number]- Aquatic therapy    Check all conditions that are expected to impact treatment: Musculoskeletal disorders   If treatment provided at initial evaluation, no treatment charged due to lack of authorization.

## 2022-06-12 NOTE — Therapy (Signed)
OUTPATIENT PHYSICAL THERAPY TREATMENT NOTE   Patient Name: Shawn Harper MRN: [id number] DOB:1995-08-24, 26 y.o., male Today's Date: 06/13/2022  PCP: Marin Olp, MD   REFERRING PROVIDER: [name], DPM   END OF SESSION:   PT End of Session - 06/13/22 0919     Visit Number 2    Number of Visits 17    Date for PT Re-Evaluation 07/31/22    Authorization Type [employer]    PT Start Time 0916    PT Stop Time 1000    PT Time Calculation (min) 44 min    Activity Tolerance Patient tolerated treatment well    Behavior During Therapy WFL for tasks assessed/performed             Past Medical History:  Diagnosis Date   Anxiety    Depression    Dysrhythmia    Headache    Smoker    5 pack years, trying to quit   Past Surgical History:  Procedure Laterality Date   APPLICATION OF WOUND VAC  03/08/2022   Procedure: APPLICATION OF WOUND VAC;  Surgeon: [name], DPM;  Location: [facility];  Service: Podiatry;;   FLAT FOOT RECONSTRUCTION-TAL GASTROC RECESSION Left 03/08/2022   Procedure: FLAT FOOT RECONSTRUCTION WITH FUSION OF JOINTS IN BACK OF FOOT; POSSIBLE CALF MUSCLE LENGTHENING;BONEGRAFT INTO FOOT TO BUILD ARCH; BONEGRAFT FROM LEG;  Surgeon: [name], DPM;  Location: [facility];  Service: Podiatry;  Laterality: Left;  POPLITEL AND SAPHENOUS BLOCK   LEG SURGERY     within a month of birth, in cast for infancy   Patient Active Problem List   Diagnosis Date Noted   Coalition, talocalcaneal    Osteoarthritis of left ankle and foot    Smoker 01/28/2021   Unprotected sex 10/07/2014   GAD (generalized anxiety disorder) 10/07/2014   Depression 10/07/2014   Former smoker     REFERRING DIAG: Pes planus, rigid, left; Coalition, talocalcaneal  THERAPY DIAG:  Pain in left ankle and joints of left foot  Muscle weakness (generalized)  Other abnormalities of gait and mobility  Rationale for Evaluation and Treatment Rehabilitation  PERTINENT HISTORY: Flat  foot reconstruction with subtalar and talonavicular fusion, achilles lengthening   PRECAUTIONS: None  SUBJECTIVE:             SUBJECTIVE STATEMENT:  Patient reports he got a brace yesterday for his ankle so he has been able to transition out of the boot.   PAIN:  Are you having pain? Yes:  NPRS scale: 2/10 (8-10/10 with walking) Pain location: Left foot, arch and plantar aspect Pain description: "feels like a fracture through the bone and pressure" Aggravating factors: Walking, standing (weight bearing) Relieving factors: Rest, sitting   PATIENT GOALS: Walking normally without pain, hiking, working out   OBJECTIVE: (objective measures completed at initial evaluation unless otherwise dated) PATIENT SURVEYS:  LEFS 33/80   MUSCLE LENGTH: Calf flexibility deficit   POSTURE:              Patient rests in 5 deg of eversion   PALPATION:  Tender to plantar aspect of foot especially around navicular and met head regions   LOWER EXTREMITY ROM:   Active ROM Right eval Left eval  Ankle dorsiflexion - 4 lacking  Ankle plantarflexion - 30 deg  Ankle inversion - 0  Ankle eversion - 5    LOWER EXTREMITY MMT:   MMT Right eval Left eval  Ankle dorsiflexion - 4  Ankle plantarflexion -  2  Ankle inversion - -  Ankle eversion - -    GAIT: Distance walked: 30 ft Assistive device utilized: None Level of assistance: Complete Independence Comments: patient ambulating with CAM boot and ambulation not assessed without boot, antalgic gait on left     TODAY'S TREATMENT: [facility] Adult PT Treatment:                                                DATE: 06/13/2022 Therapeutic Exercise: Longsitting calf stretch 3 x 30 sec Seated ankle PF stretch 3 x 10 sec Seated toe flex / ext stretch 3 x 10 sec each Review of HEP Manual: Skilled palpation and monitoring of muscle tension while performing TPDN STM / TPD for gastroc and soleus Talocrural mobs for DF / PF motion Trigger Point Dry  Needling Treatment: Pre-treatment instruction: Patient instructed on dry needling rationale, procedures, and possible side effects including pain during treatment (achy,cramping feeling), bruising, drop of blood, lightheadedness, nausea, sweating. Patient Consent Given: Yes Education handout provided: No Muscles treated: Right medial gastroc  Needle size and number: .25x68m x 3 Electrical stimulation performed: Yes Parameters:  Milli, 5-10 frequency, intensity to patient tolerance Treatment response/outcome: Twitch response elicited, Palpable decrease in muscle tension, and patient reporting improvement in tightness Post-treatment instructions: Patient instructed to expect possible mild to moderate muscle soreness later today and/or tomorrow. Patient instructed in methods to reduce muscle soreness and to continue prescribed HEP. If patient was dry needled over the lung field, patient was instructed on signs and symptoms of pneumothorax and, however unlikely, to see immediate medical attention should they occur. Patient was also educated on signs and symptoms of infection and to seek medical attention should they occur. Patient verbalized understanding of these instructions and education.   [facility]                                                DATE: 06/[facility]/2022 Therapeutic Exercise: Longsitting calf stretch 3 x 30 sec Banded ankle PF / DF with red x15 each Seated ankle PF stretch 3 x 10 sec Seated toe flex / ext stretch 3 x 10 sec each Seated heel toe raises x 15   PATIENT EDUCATION:  Education details: HEP, TPDN Person educated: Patient Education method: Explanation, Demonstration, Tactile cues, Verbal cues, and Handouts Education comprehension: verbalized understanding, returned demonstration, verbal cues required, tactile cues required, and needs further education   HOME EXERCISE PROGRAM: Access Code: [email or web address]     ASSESSMENT: CLINICAL IMPRESSION: Patient tolerated  therapy well with no adverse effects. Therapy focused primarily on progressing ankle mobility and calf flexibility. Utilized TPDn with e-stim attended to reduce calf trigger points and tightness. He did report feeling less stiffness following treatment this visit. He was provided a tennis ball for SMFR of the calf for home. Patient would benefit from continued skilled PT to progress his mobility and strengthening in order to reduce pain, walking tolerance, and maximize functional ability.     OBJECTIVE IMPAIRMENTS: Abnormal gait, decreased activity tolerance, decreased balance, difficulty walking, decreased ROM, decreased strength, impaired flexibility, and pain.    ACTIVITY LIMITATIONS: standing, squatting, stairs, and locomotion level   PARTICIPATION LIMITATIONS: meal prep, cleaning, shopping, community activity, occupation, and  yard work   PERSONAL FACTORS: Past/current experiences and Time since onset of injury/illness/exacerbation are also affecting patient's functional outcome.      GOALS: Goals reviewed with patient? Yes   SHORT TERM GOALS: Target date: 07/03/2022    Patient will be I with initial HEP in order to progress with therapy. Baseline: HEP provided at eval Goal status: INITIAL   2.  Patient will demonstrate >/= 0 deg left ankle DF to improve gait with normal footwear to wean from CAM boot and allow for improve household ambulation Baseline: lacking 4 deg Goal status: INITIAL   3.  Patient will report left ankle and foot pain </= 5/10 with weight bearing using normal footwear to improved standing and walking ability  Baseline: 8-10/10 pain with walking Goal status: INITIAL   LONG TERM GOALS: Target date: 07/31/2022    Patient will be I with final HEP to maintain progress from PT. Baseline: HEP provided at eval Goal status: INITIAL   2.  Patient will report LEFS >/= 60/80 in order to indicate improved functional status and return to walking and household activities  without limitation Baseline: 33/80 Goal status: INITIAL   3.  Patient will demonstrate >/= 5 deg left ankle DF to allow for improved gait mechanics and stair negotiation for improved community access Baseline: lacking 4 deg Goal status: INITIAL   4.  Patient will exhibit left ankle strength grossly >/= 4/5 MMT within available ranges to improve walking and standing tolerance so patient can return to prior activities such as hiking Baseline: patient demonstrates significant ankle strength deficits Goal status: INITIAL   5.  Patient will report left ankle pain </= 2/10 with walking community level distances in order to improve community access and grocery shopping ability Baseline: 8-10/10 pain with walking Goal status: INITIAL     PLAN: PT FREQUENCY: 1-2x/week   PT DURATION: 8 weeks   PLANNED INTERVENTIONS: Therapeutic exercises, Therapeutic activity, Neuromuscular re-education, Balance training, Gait training, Patient/Family education, Self Care, Joint mobilization, Joint manipulation, Stair training, Aquatic Therapy, Dry Needling, Electrical stimulation, Cryotherapy, Moist heat, Taping, Ionotophoresis 11m/ml Dexamethasone, Manual therapy, and Re-evaluation   PLAN FOR NEXT SESSION: Review HEP and progress PRN, manual/mobs for talocrural and improving ankle and calf mobility, progress ankle strengthening, gait training and balance PRN, aquatics?    CHilda Blades PT, DPT, LAT, ATC 06/13/22  11:10 AM Phone: [phone]: [phone]

## 2022-06-13 ENCOUNTER — Encounter: Payer: Self-pay | Admitting: Physical Therapy

## 2022-06-13 ENCOUNTER — Other Ambulatory Visit: Payer: Self-pay

## 2022-06-13 ENCOUNTER — Ambulatory Visit: Payer: Medicaid Other | Attending: Family Medicine | Admitting: Physical Therapy

## 2022-06-13 DIAGNOSIS — M25572 Pain in left ankle and joints of left foot: Secondary | ICD-10-CM | POA: Diagnosis present

## 2022-06-13 DIAGNOSIS — R2689 Other abnormalities of gait and mobility: Secondary | ICD-10-CM | POA: Diagnosis present

## 2022-06-13 DIAGNOSIS — M6281 Muscle weakness (generalized): Secondary | ICD-10-CM | POA: Diagnosis present

## 2022-06-13 NOTE — Patient Instructions (Signed)
Access Code: [email or web address] URL: [email or web address] Date: 06/13/2022 Prepared by: Hilda Blades  Exercises - Long Sitting Calf Stretch with Strap  - 2-3 x daily - 3 reps - 30 seconds hold - Long Sitting Ankle Plantar Flexion with Resistance  - 1 x daily - 3 sets - 15 reps - Long Sitting Ankle Dorsiflexion with Anchored Resistance  - 1 x daily - 3 sets - 15 reps - Seated Ankle Inversion Eversion PROM  - 2-3 x daily - 10 reps - 10 seconds hold - Seated Toe Flexion Extension PROM  - 2-3 x daily - 10 reps - 10 seconds hold - Seated Heel Raise  - 2-3 x daily - 3 sets - 15 reps - Calf Mobilization with Small Ball  - 2-3 x daily

## 2022-06-13 NOTE — Therapy (Signed)
OUTPATIENT PHYSICAL THERAPY TREATMENT NOTE   Patient Name: Shawn Harper MRN: [id number] DOB:January 30, 1996, 26 y.o., male Today's Date: 06/14/2022  PCP: Marin Olp, MD   REFERRING PROVIDER: [name], DPM   END OF SESSION:   PT End of Session - 06/14/22 0942     Visit Number 3    Number of Visits 17    Date for PT Re-Evaluation 07/31/22    Authorization Type [employer]    PT Start Time 0915    PT Stop Time 0955    PT Time Calculation (min) 40 min    Activity Tolerance Patient tolerated treatment well    Behavior During Therapy WFL for tasks assessed/performed              Past Medical History:  Diagnosis Date   Anxiety    Depression    Dysrhythmia    Headache    Smoker    5 pack years, trying to quit   Past Surgical History:  Procedure Laterality Date   APPLICATION OF WOUND VAC  03/08/2022   Procedure: APPLICATION OF WOUND VAC;  Surgeon: [name], DPM;  Location: [facility];  Service: Podiatry;;   FLAT FOOT RECONSTRUCTION-TAL GASTROC RECESSION Left 03/08/2022   Procedure: FLAT FOOT RECONSTRUCTION WITH FUSION OF JOINTS IN BACK OF FOOT; POSSIBLE CALF MUSCLE LENGTHENING;BONEGRAFT INTO FOOT TO BUILD ARCH; BONEGRAFT FROM LEG;  Surgeon: [name], DPM;  Location: [facility];  Service: Podiatry;  Laterality: Left;  POPLITEL AND SAPHENOUS BLOCK   LEG SURGERY     within a month of birth, in cast for infancy   Patient Active Problem List   Diagnosis Date Noted   Coalition, talocalcaneal    Osteoarthritis of left ankle and foot    Smoker 01/28/2021   Unprotected sex 10/07/2014   GAD (generalized anxiety disorder) 10/07/2014   Depression 10/07/2014   Former smoker     REFERRING DIAG: Pes planus, rigid, left; Coalition, talocalcaneal  THERAPY DIAG:  Pain in left ankle and joints of left foot  Muscle weakness (generalized)  Other abnormalities of gait and mobility  Rationale for Evaluation and Treatment Rehabilitation  PERTINENT HISTORY: Flat  foot reconstruction with subtalar and talonavicular fusion, achilles lengthening   PRECAUTIONS: None   SUBJECTIVE:             SUBJECTIVE STATEMENT:  Patient reports is doing well, not too much soreness after yesterday.  PAIN:  Are you having pain? Yes:  NPRS scale: 0/10 (8-10/10 with walking) Pain location: Left foot, arch and plantar aspect Pain description: "feels like a fracture through the bone and pressure" Aggravating factors: Walking, standing (weight bearing) Relieving factors: Rest, sitting   PATIENT GOALS: Walking normally without pain, hiking, working out   OBJECTIVE: (objective measures completed at initial evaluation unless otherwise dated) PATIENT SURVEYS:  LEFS 33/80   MUSCLE LENGTH: Calf flexibility deficit   POSTURE:              Patient rests in 5 deg of eversion   PALPATION:  Tender to plantar aspect of foot especially around navicular and met head regions   LOWER EXTREMITY ROM:   Active ROM Right eval Left eval Left 06/14/2022  Ankle dorsiflexion - 4 lacking 0  Ankle plantarflexion - 30 deg   Ankle inversion - 0   Ankle eversion - 5     LOWER EXTREMITY MMT:   MMT Right eval Left eval  Ankle dorsiflexion - 4  Ankle plantarflexion - 2  Ankle inversion - -  Ankle eversion - -    GAIT: Distance walked: 30 ft Assistive device utilized: None Level of assistance: Complete Independence Comments: patient ambulating with CAM boot and ambulation not assessed without boot, antalgic gait on left     TODAY'S TREATMENT: [facility] Adult PT Treatment:                                                DATE: 06/14/2022 Therapeutic Exercise: Longsitting calf stretch 3 x 30 sec Seated BAPS L3 A/P x 20 Seated heel raises on edge of step 15# 3 x 15 Banded ankle PF / DF with red 3 x 15 each Standing calf stretch at wall 2 x 30 sec Manual: STM / TPD for anterior tib Talocrural mobs for DF / PF motion Ankle PROM DF / PF   [facility] Adult PT Treatment:                                                 DATE: 06/13/2022 Therapeutic Exercise: Longsitting calf stretch 3 x 30 sec Seated ankle PF stretch 3 x 10 sec Seated toe flex / ext stretch 3 x 10 sec each Review of HEP Manual: Skilled palpation and monitoring of muscle tension while performing TPDN STM / TPD for gastroc and soleus Talocrural mobs for DF / PF motion Trigger Point Dry Needling Treatment: Pre-treatment instruction: Patient instructed on dry needling rationale, procedures, and possible side effects including pain during treatment (achy,cramping feeling), bruising, drop of blood, lightheadedness, nausea, sweating. Patient Consent Given: Yes Education handout provided: No Muscles treated: Right medial gastroc  Needle size and number: .25x32m x 3 Electrical stimulation performed: Yes Parameters:  Milli, 5-10 frequency, intensity to patient tolerance Treatment response/outcome: Twitch response elicited, Palpable decrease in muscle tension, and patient reporting improvement in tightness Post-treatment instructions: Patient instructed to expect possible mild to moderate muscle soreness later today and/or tomorrow. Patient instructed in methods to reduce muscle soreness and to continue prescribed HEP. If patient was dry needled over the lung field, patient was instructed on signs and symptoms of pneumothorax and, however unlikely, to see immediate medical attention should they occur. Patient was also educated on signs and symptoms of infection and to seek medical attention should they occur. Patient verbalized understanding of these instructions and education.  [facility]                                                [facility]: 06/05/2022 Therapeutic Exercise: Longsitting calf stretch 3 x 30 sec Banded ankle PF / DF with red x15 each Seated ankle PF stretch 3 x 10 sec Seated toe flex / ext stretch 3 x 10 sec each Seated heel toe raises x 15   PATIENT EDUCATION:  Education details: HEP  update Person educated: Patient Education method: Explanation, Demonstration, Tactile cues, Verbal cues, HEP Education comprehension: verbalized understanding, returned demonstration, verbal cues required, tactile cues required, and needs further education   HOME EXERCISE PROGRAM: Access Code: [email or web address]     ASSESSMENT: CLINICAL IMPRESSION: Patient tolerated therapy well with no adverse  effects. Therapy focus on progressing ankle mobility and strengthening this visit. He does demonstrate improve ankle motion this visit and was able to tolerate progressions with strength and increased resistance. Updated HEP to progress standing stretching. Patient would benefit from continued skilled PT to progress his mobility and strengthening in order to reduce pain, walking tolerance, and maximize functional ability.     OBJECTIVE IMPAIRMENTS: Abnormal gait, decreased activity tolerance, decreased balance, difficulty walking, decreased ROM, decreased strength, impaired flexibility, and pain.    ACTIVITY LIMITATIONS: standing, squatting, stairs, and locomotion level   PARTICIPATION LIMITATIONS: meal prep, cleaning, shopping, community activity, occupation, and yard work   PERSONAL FACTORS: Past/current experiences and Time since onset of injury/illness/exacerbation are also affecting patient's functional outcome.      GOALS: Goals reviewed with patient? Yes   SHORT TERM GOALS: Target date: 07/03/2022    Patient will be I with initial HEP in order to progress with therapy. Baseline: HEP provided at eval Goal status: INITIAL   2.  Patient will demonstrate >/= 0 deg left ankle DF to improve gait with normal footwear to wean from CAM boot and allow for improve household ambulation Baseline: lacking 4 deg Goal status: INITIAL   3.  Patient will report left ankle and foot pain </= 5/10 with weight bearing using normal footwear to improved standing and walking ability  Baseline: 8-10/10 pain with  walking Goal status: INITIAL   LONG TERM GOALS: Target date: 07/31/2022    Patient will be I with final HEP to maintain progress from PT. Baseline: HEP provided at eval Goal status: INITIAL   2.  Patient will report LEFS >/= 60/80 in order to indicate improved functional status and return to walking and household activities without limitation Baseline: 33/80 Goal status: INITIAL   3.  Patient will demonstrate >/= 5 deg left ankle DF to allow for improved gait mechanics and stair negotiation for improved community access Baseline: lacking 4 deg Goal status: INITIAL   4.  Patient will exhibit left ankle strength grossly >/= 4/5 MMT within available ranges to improve walking and standing tolerance so patient can return to prior activities such as hiking Baseline: patient demonstrates significant ankle strength deficits Goal status: INITIAL   5.  Patient will report left ankle pain </= 2/10 with walking community level distances in order to improve community access and grocery shopping ability Baseline: 8-10/10 pain with walking Goal status: INITIAL     PLAN: PT FREQUENCY: 1-2x/week   PT DURATION: 8 weeks   PLANNED INTERVENTIONS: Therapeutic exercises, Therapeutic activity, Neuromuscular re-education, Balance training, Gait training, Patient/Family education, Self Care, Joint mobilization, Joint manipulation, Stair training, Aquatic Therapy, Dry Needling, Electrical stimulation, Cryotherapy, Moist heat, Taping, Ionotophoresis 41m/ml Dexamethasone, Manual therapy, and Re-evaluation   PLAN FOR NEXT SESSION: Review HEP and progress PRN, manual/mobs for talocrural and improving ankle and calf mobility, progress ankle strengthening, gait training and balance PRN, aquatics?    CHilda Blades PT, DPT, LAT, ATC 06/14/22  10:01 AM Phone: [phone]: [phone]

## 2022-06-14 ENCOUNTER — Other Ambulatory Visit: Payer: Self-pay

## 2022-06-14 ENCOUNTER — Ambulatory Visit: Payer: Medicaid Other | Admitting: Physical Therapy

## 2022-06-14 ENCOUNTER — Encounter: Payer: Self-pay | Admitting: Physical Therapy

## 2022-06-14 DIAGNOSIS — M25572 Pain in left ankle and joints of left foot: Secondary | ICD-10-CM | POA: Diagnosis not present

## 2022-06-14 DIAGNOSIS — M6281 Muscle weakness (generalized): Secondary | ICD-10-CM

## 2022-06-14 DIAGNOSIS — R2689 Other abnormalities of gait and mobility: Secondary | ICD-10-CM

## 2022-06-14 NOTE — Patient Instructions (Signed)
Access Code: [email or web address] URL: [email or web address] Date: 06/14/2022 Prepared by: Hilda Blades  Exercises - Long Sitting Calf Stretch with Strap  - 2-3 x daily - 3 reps - 30 seconds hold - Long Sitting Ankle Plantar Flexion with Resistance  - 1 x daily - 3 sets - 15 reps - Long Sitting Ankle Dorsiflexion with Anchored Resistance  - 1 x daily - 3 sets - 15 reps - Seated Ankle Inversion Eversion PROM  - 2-3 x daily - 10 reps - 10 seconds hold - Seated Toe Flexion Extension PROM  - 2-3 x daily - 10 reps - 10 seconds hold - Seated Heel Raise  - 2-3 x daily - 3 sets - 15 reps - Calf Mobilization with Small Ball  - 2-3 x daily - Gastroc Stretch on Wall  - 2 x daily - 3 reps - 30 seconds hold - Soleus Stretch on Wall  - 2 x daily - 3 reps - 30 seconds hold

## 2022-06-17 NOTE — Therapy (Signed)
OUTPATIENT PHYSICAL THERAPY TREATMENT NOTE   Patient Name: Shawn Harper MRN: [id number] DOB:1995/09/06, 26 y.o., male Today's Date: 06/18/2022  PCP: Marin Olp, MD   REFERRING PROVIDER: [name], DPM   END OF SESSION:   PT End of Session - 06/18/22 0932     Visit Number 4    Number of Visits 17    Date for PT Re-Evaluation 07/31/22    Authorization Type [employer]    PT Start Time 0932    PT Stop Time 1014    PT Time Calculation (min) 42 min    Activity Tolerance Patient tolerated treatment well    Behavior During Therapy WFL for tasks assessed/performed               Past Medical History:  Diagnosis Date   Anxiety    Depression    Dysrhythmia    Headache    Smoker    5 pack years, trying to quit   Past Surgical History:  Procedure Laterality Date   APPLICATION OF WOUND VAC  03/08/2022   Procedure: APPLICATION OF WOUND VAC;  Surgeon: [name], DPM;  Location: [facility];  Service: Podiatry;;   FLAT FOOT RECONSTRUCTION-TAL GASTROC RECESSION Left 03/08/2022   Procedure: FLAT FOOT RECONSTRUCTION WITH FUSION OF JOINTS IN BACK OF FOOT; POSSIBLE CALF MUSCLE LENGTHENING;BONEGRAFT INTO FOOT TO BUILD ARCH; BONEGRAFT FROM LEG;  Surgeon: [name], DPM;  Location: [facility];  Service: Podiatry;  Laterality: Left;  POPLITEL AND SAPHENOUS BLOCK   LEG SURGERY     within a month of birth, in cast for infancy   Patient Active Problem List   Diagnosis Date Noted   Coalition, talocalcaneal    Osteoarthritis of left ankle and foot    Smoker 01/28/2021   Unprotected sex 10/07/2014   GAD (generalized anxiety disorder) 10/07/2014   Depression 10/07/2014   Former smoker     REFERRING DIAG: Pes planus, rigid, left; Coalition, talocalcaneal  THERAPY DIAG:  Pain in left ankle and joints of left foot  Muscle weakness (generalized)  Other abnormalities of gait and mobility  Rationale for Evaluation and Treatment Rehabilitation  PERTINENT HISTORY:  Flat foot reconstruction with subtalar and talonavicular fusion, achilles lengthening   PRECAUTIONS: None   SUBJECTIVE:             SUBJECTIVE STATEMENT:  Patient reports the foot is still painful, mostly when he is standing/walking.   PAIN:  Are you having pain? Yes:  NPRS scale: 2/10 Pain location: Left metatarsals heads and dorsal aspect of the foot  Pain description: "like you have a rock in your shoe"; pressure  Aggravating factors: Walking, standing (weight bearing) Relieving factors: Rest, sitting   PATIENT GOALS: Walking normally without pain, hiking, working out   OBJECTIVE: (objective measures completed at initial evaluation unless otherwise dated) PATIENT SURVEYS:  LEFS 33/80   MUSCLE LENGTH: Calf flexibility deficit   POSTURE:              Patient rests in 5 deg of eversion   PALPATION:  Tender to plantar aspect of foot especially around navicular and met head regions   LOWER EXTREMITY ROM:   Active ROM Right eval Left eval Left 06/14/2022  Ankle dorsiflexion - 4 lacking 0  Ankle plantarflexion - 30 deg   Ankle inversion - 0   Ankle eversion - 5     LOWER EXTREMITY MMT:   MMT Right eval Left eval  Ankle dorsiflexion - 4  Ankle plantarflexion - 2  Ankle inversion - -  Ankle eversion - -    GAIT: Distance walked: 30 ft Assistive device utilized: None Level of assistance: Complete Independence Comments: patient ambulating with CAM boot and ambulation not assessed without boot, antalgic gait on left     TODAY'S TREATMENT: [facility] Adult PT Treatment:                                                DATE: 06/18/22 Therapeutic Exercise: Seated rockerboard A/P 2 x 10  Great toe extension 2 x 10 Lesser toe extension attempted unable  Toe curls 2 x 10  Seated ankle eversion AROM 2 x 10; stabilization at tibia  Seated ankle inversion AROM 2 x 10 Seated calf raise 2 x 15; 15 lbs  Updated HEP  Manual Therapy: STM anterior tib, posterior tib,  gastroc/soleus, plantar fascia Passive gastroc/soleus stretch MT glides    [facility] Adult PT Treatment:                                                DATE: 06/14/2022 Therapeutic Exercise: Longsitting calf stretch 3 x 30 sec Seated BAPS L3 A/P x 20 Seated heel raises on edge of step 15# 3 x 15 Banded ankle PF / DF with red 3 x 15 each Standing calf stretch at wall 2 x 30 sec Manual: STM / TPD for anterior tib Talocrural mobs for DF / PF motion Ankle PROM DF / PF   [facility] Adult PT Treatment:                                                DATE: 06/13/2022 Therapeutic Exercise: Longsitting calf stretch 3 x 30 sec Seated ankle PF stretch 3 x 10 sec Seated toe flex / ext stretch 3 x 10 sec each Review of HEP Manual: Skilled palpation and monitoring of muscle tension while performing TPDN STM / TPD for gastroc and soleus Talocrural mobs for DF / PF motion Trigger Point Dry Needling Treatment: Pre-treatment instruction: Patient instructed on dry needling rationale, procedures, and possible side effects including pain during treatment (achy,cramping feeling), bruising, drop of blood, lightheadedness, nausea, sweating. Patient Consent Given: Yes Education handout provided: No Muscles treated: Right medial gastroc  Needle size and number: .25x12m x 3 Electrical stimulation performed: Yes Parameters:  Milli, 5-10 frequency, intensity to patient tolerance Treatment response/outcome: Twitch response elicited, Palpable decrease in muscle tension, and patient reporting improvement in tightness Post-treatment instructions: Patient instructed to expect possible mild to moderate muscle soreness later today and/or tomorrow. Patient instructed in methods to reduce muscle soreness and to continue prescribed HEP. If patient was dry needled over the lung field, patient was instructed on signs and symptoms of pneumothorax and, however unlikely, to see immediate medical attention should they occur. Patient  was also educated on signs and symptoms of infection and to seek medical attention should they occur. Patient verbalized understanding of these instructions and education.  OPATIENT EDUCATION:  Education details: HEP update Person educated: Patient Education method: Explanation, Demonstration, Tactile cues, Verbal cues, HEP Education comprehension: verbalized  understanding, returned demonstration, verbal cues required, tactile cues required, and needs further education   HOME EXERCISE PROGRAM: Access Code: [email or web address]     ASSESSMENT: CLINICAL IMPRESSION: Patient tolerated therapy well with no adverse effects.  Introduced intrinsic foot strengthening with patient unable to elicit extension of the lesser toes. He is able to complete very small range of active ankle inversion and eversion AROM, but has difficulty controlling excessive tibial movement with eversion. He is noted to have tautness and palpable tenderness about posterior tibialis and would potentially benefit from TPDN at future sessions if tautness remains.HEP updated to include further strengthening.      OBJECTIVE IMPAIRMENTS: Abnormal gait, decreased activity tolerance, decreased balance, difficulty walking, decreased ROM, decreased strength, impaired flexibility, and pain.    ACTIVITY LIMITATIONS: standing, squatting, stairs, and locomotion level   PARTICIPATION LIMITATIONS: meal prep, cleaning, shopping, community activity, occupation, and yard work   PERSONAL FACTORS: Past/current experiences and Time since onset of injury/illness/exacerbation are also affecting patient's functional outcome.      GOALS: Goals reviewed with patient? Yes   SHORT TERM GOALS: Target date: 07/03/2022    Patient will be I with initial HEP in order to progress with therapy. Baseline: HEP provided at eval Goal status: INITIAL   2.  Patient will demonstrate >/= 0 deg left ankle DF to improve gait with normal footwear to wean from CAM boot and  allow for improve household ambulation Baseline: lacking 4 deg Goal status: INITIAL   3.  Patient will report left ankle and foot pain </= 5/10 with weight bearing using normal footwear to improved standing and walking ability  Baseline: 8-10/10 pain with walking Goal status: INITIAL   LONG TERM GOALS: Target date: 07/31/2022    Patient will be I with final HEP to maintain progress from PT. Baseline: HEP provided at eval Goal status: INITIAL   2.  Patient will report LEFS >/= 60/80 in order to indicate improved functional status and return to walking and household activities without limitation Baseline: 33/80 Goal status: INITIAL   3.  Patient will demonstrate >/= 5 deg left ankle DF to allow for improved gait mechanics and stair negotiation for improved community access Baseline: lacking 4 deg Goal status: INITIAL   4.  Patient will exhibit left ankle strength grossly >/= 4/5 MMT within available ranges to improve walking and standing tolerance so patient can return to prior activities such as hiking Baseline: patient demonstrates significant ankle strength deficits Goal status: INITIAL   5.  Patient will report left ankle pain </= 2/10 with walking community level distances in order to improve community access and grocery shopping ability Baseline: 8-10/10 pain with walking Goal status: INITIAL     PLAN: PT FREQUENCY: 1-2x/week   PT DURATION: 8 weeks   PLANNED INTERVENTIONS: Therapeutic exercises, Therapeutic activity, Neuromuscular re-education, Balance training, Gait training, Patient/Family education, Self Care, Joint mobilization, Joint manipulation, Stair training, Aquatic Therapy, Dry Needling, Electrical stimulation, Cryotherapy, Moist heat, Taping, Ionotophoresis 9m/ml Dexamethasone, Manual therapy, and Re-evaluation   PLAN FOR NEXT SESSION: Review HEP and progress PRN, manual/mobs for talocrural and improving ankle and calf mobility, progress ankle strengthening,  gait training and balance PRN, aquatics?; consider TPDN to posterior tib.    SGwendolyn Grant PT, DPT, ATC 06/18/22 10:15 AM

## 2022-06-18 ENCOUNTER — Ambulatory Visit: Payer: Medicaid Other

## 2022-06-18 DIAGNOSIS — M6281 Muscle weakness (generalized): Secondary | ICD-10-CM

## 2022-06-18 DIAGNOSIS — R2689 Other abnormalities of gait and mobility: Secondary | ICD-10-CM

## 2022-06-18 DIAGNOSIS — M25572 Pain in left ankle and joints of left foot: Secondary | ICD-10-CM

## 2022-06-19 NOTE — Therapy (Signed)
OUTPATIENT PHYSICAL THERAPY TREATMENT NOTE   Patient Name: Shawn Harper MRN: [id number] DOB:April 27, 1996, 26 y.o., male Today's Date: 06/20/2022  PCP: Marin Olp, MD   REFERRING PROVIDER: [name], DPM   END OF SESSION:   PT End of Session - 06/20/22 0953     Visit Number 5    Number of Visits 17    Date for PT Re-Evaluation 07/31/22    Authorization Type [employer]    PT Start Time 0915    PT Stop Time 1000    PT Time Calculation (min) 45 min    Activity Tolerance Patient tolerated treatment well    Behavior During Therapy WFL for tasks assessed/performed                Past Medical History:  Diagnosis Date   Anxiety    Depression    Dysrhythmia    Headache    Smoker    5 pack years, trying to quit   Past Surgical History:  Procedure Laterality Date   APPLICATION OF WOUND VAC  03/08/2022   Procedure: APPLICATION OF WOUND VAC;  Surgeon: [name], DPM;  Location: [facility];  Service: Podiatry;;   FLAT FOOT RECONSTRUCTION-TAL GASTROC RECESSION Left 03/08/2022   Procedure: FLAT FOOT RECONSTRUCTION WITH FUSION OF JOINTS IN BACK OF FOOT; POSSIBLE CALF MUSCLE LENGTHENING;BONEGRAFT INTO FOOT TO BUILD ARCH; BONEGRAFT FROM LEG;  Surgeon: [name], DPM;  Location: [facility];  Service: Podiatry;  Laterality: Left;  POPLITEL AND SAPHENOUS BLOCK   LEG SURGERY     within a month of birth, in cast for infancy   Patient Active Problem List   Diagnosis Date Noted   Coalition, talocalcaneal    Osteoarthritis of left ankle and foot    Smoker 01/28/2021   Unprotected sex 10/07/2014   GAD (generalized anxiety disorder) 10/07/2014   Depression 10/07/2014   Former smoker     REFERRING DIAG: Pes planus, rigid, left; Coalition, talocalcaneal  THERAPY DIAG:  Pain in left ankle and joints of left foot  Muscle weakness (generalized)  Other abnormalities of gait and mobility  Rationale for Evaluation and Treatment Rehabilitation  PERTINENT HISTORY:  Flat foot reconstruction with subtalar and talonavicular fusion, achilles lengthening   PRECAUTIONS: None   SUBJECTIVE:             SUBJECTIVE STATEMENT:  Patient reports today is not a good day. The ball of the foot always hurts but today it feels like the tendon of the big toe on top of the foot is bothering him.    PAIN:  Are you having pain? Yes:  NPRS scale: 2/10 Pain location: Left metatarsals heads and dorsal aspect of the foot  Pain description: "like you have a rock in your shoe"; pressure  Aggravating factors: Walking, standing (weight bearing) Relieving factors: Rest, sitting   PATIENT GOALS: Walking normally without pain, hiking, working out   OBJECTIVE: (objective measures completed at initial evaluation unless otherwise dated) PATIENT SURVEYS:  LEFS 33/80   MUSCLE LENGTH: Calf flexibility deficit   POSTURE:              Patient rests in 5 deg of eversion   PALPATION:  Tender to plantar aspect of foot especially around navicular and met head regions   LOWER EXTREMITY ROM:   Active ROM Right eval Left eval Left 06/14/2022  Ankle dorsiflexion - 4 lacking 0  Ankle plantarflexion - 30 deg   Ankle inversion - 0  Ankle eversion - 5     LOWER EXTREMITY MMT:   MMT Right eval Left eval Left 06/20/2022  Ankle dorsiflexion - 4 4  Ankle plantarflexion - 2 2  Ankle inversion - -   Ankle eversion - -     GAIT: Distance walked: 30 ft Assistive device utilized: None Level of assistance: Complete Independence Comments: patient ambulating with CAM boot and ambulation not assessed without boot, antalgic gait on left     TODAY'S TREATMENT: [facility] Adult PT Treatment:                                                DATE: 06/20/22 Therapeutic Exercise: Longsitting calf stretch 3 x 30 sec Seated rockerboard A/P x 20  Seated great toe extension x 20 Longsitting PF with blue 2 x 20 Seated heel raises with 20# 2 x 20 Standing ankle DF at wall 2 x 10 Manual  Therapy: STM anterior tib, posterior tib, gastroc/soleus, plantar fascia Passive gastroc/soleus stretch Talocrurual mobs all directions Trigger Point Dry Needling Treatment: Pre-treatment instruction: Patient instructed on dry needling rationale, procedures, and possible side effects including pain during treatment (achy,cramping feeling), bruising, drop of blood, lightheadedness, nausea, sweating. Patient Consent Given: Yes Education handout provided: No Muscles treated: Right posterior tib, soleus, anterior tib Needle size and number: .30x75m x 3, .30x367mx 2 Electrical stimulation performed: No Parameters: N/A Treatment response/outcome: Twitch response elicited, Palpable decrease in muscle tension, and patient reporting improvement in tightness Post-treatment instructions: Patient instructed to expect possible mild to moderate muscle soreness later today and/or tomorrow. Patient instructed in methods to reduce muscle soreness and to continue prescribed HEP. If patient was dry needled over the lung field, patient was instructed on signs and symptoms of pneumothorax and, however unlikely, to see immediate medical attention should they occur. Patient was also educated on signs and symptoms of infection and to seek medical attention should they occur. Patient verbalized understanding of these instructions and education.   [facility]                                                [facility]: [facility] Therapeutic Exercise: Seated rockerboard A/P 2 x 10  Great toe extension 2 x 10 Lesser toe extension attempted unable  Toe curls 2 x 10  Seated ankle eversion AROM 2 x 10; stabilization at tibia  Seated ankle inversion AROM 2 x 10 Seated calf raise 2 x 15; 15 lbs  Updated HEP  Manual Therapy: STM anterior tib, posterior tib, gastroc/soleus, plantar fascia Passive gastroc/soleus stretch MT glides  [facility] Adult PT Treatment:                                                DATE:  06/14/2022 Therapeutic Exercise: Longsitting calf stretch 3 x 30 sec Seated BAPS L3 A/P x 20 Seated heel raises on edge of step 15# 3 x 15 Banded ankle PF / DF with red 3 x 15 each Standing calf stretch at wall 2 x 30 sec Manual: STM / TPD for anterior tib Talocrural mobs for DF /  PF motion Ankle PROM DF / PF  PATIENT EDUCATION:  Education details: HEP Person educated: Patient Education method: Explanation, Demonstration, Tactile cues, Verbal cues Education comprehension: verbalized understanding, returned demonstration, verbal cues required, tactile cues required, and needs further education   HOME EXERCISE PROGRAM: Access Code: [email or web address]     ASSESSMENT: CLINICAL IMPRESSION: Patient tolerated therapy well with no adverse effects. Continued with TPDN this visit for posterior tib, soleus, and anterior tib. He was reporting increased pain along anterior tib and extensor hallucis tendon this visit so continued with stretching and incorporated more weight bearing ankle DF strengthening this visit. He remains limited with his ankle mobility and strength. No changes to HEP this visit. Patient would benefit from continued skilled PT to progress his mobility and strengthening in order to reduce pain, walking tolerance, and maximize functional ability.      OBJECTIVE IMPAIRMENTS: Abnormal gait, decreased activity tolerance, decreased balance, difficulty walking, decreased ROM, decreased strength, impaired flexibility, and pain.    ACTIVITY LIMITATIONS: standing, squatting, stairs, and locomotion level   PARTICIPATION LIMITATIONS: meal prep, cleaning, shopping, community activity, occupation, and yard work   PERSONAL FACTORS: Past/current experiences and Time since onset of injury/illness/exacerbation are also affecting patient's functional outcome.      GOALS: Goals reviewed with patient? Yes   SHORT TERM GOALS: Target date: 07/03/2022    Patient will be I with initial HEP in order to  progress with therapy. Baseline: HEP provided at eval Goal status: INITIAL   2.  Patient will demonstrate >/= 0 deg left ankle DF to improve gait with normal footwear to wean from CAM boot and allow for improve household ambulation Baseline: lacking 4 deg Goal status: INITIAL   3.  Patient will report left ankle and foot pain </= 5/10 with weight bearing using normal footwear to improved standing and walking ability  Baseline: 8-10/10 pain with walking Goal status: INITIAL   LONG TERM GOALS: Target date: 07/31/2022    Patient will be I with final HEP to maintain progress from PT. Baseline: HEP provided at eval Goal status: INITIAL   2.  Patient will report LEFS >/= 60/80 in order to indicate improved functional status and return to walking and household activities without limitation Baseline: 33/80 Goal status: INITIAL   3.  Patient will demonstrate >/= 5 deg left ankle DF to allow for improved gait mechanics and stair negotiation for improved community access Baseline: lacking 4 deg Goal status: INITIAL   4.  Patient will exhibit left ankle strength grossly >/= 4/5 MMT within available ranges to improve walking and standing tolerance so patient can return to prior activities such as hiking Baseline: patient demonstrates significant ankle strength deficits Goal status: INITIAL   5.  Patient will report left ankle pain </= 2/10 with walking community level distances in order to improve community access and grocery shopping ability Baseline: 8-10/10 pain with walking Goal status: INITIAL     PLAN: PT FREQUENCY: 1-2x/week   PT DURATION: 8 weeks   PLANNED INTERVENTIONS: Therapeutic exercises, Therapeutic activity, Neuromuscular re-education, Balance training, Gait training, Patient/Family education, Self Care, Joint mobilization, Joint manipulation, Stair training, Aquatic Therapy, Dry Needling, Electrical stimulation, Cryotherapy, Moist heat, Taping, Ionotophoresis 57m/ml  Dexamethasone, Manual therapy, and Re-evaluation   PLAN FOR NEXT SESSION: Review HEP and progress PRN, manual/mobs for talocrural and improving ankle and calf mobility, progress ankle strengthening, gait training and balance PRN, aquatics?; consider TPDN to posterior tib.    CHilda Blades PT, DPT, LAT, ATC 06/20/22  10:39 AM Phone: [phone] Fax: [phone]

## 2022-06-20 ENCOUNTER — Ambulatory Visit: Payer: Medicaid Other | Admitting: Physical Therapy

## 2022-06-20 ENCOUNTER — Encounter: Payer: Self-pay | Admitting: Physical Therapy

## 2022-06-20 ENCOUNTER — Other Ambulatory Visit: Payer: Self-pay

## 2022-06-20 DIAGNOSIS — M6281 Muscle weakness (generalized): Secondary | ICD-10-CM

## 2022-06-20 DIAGNOSIS — M25572 Pain in left ankle and joints of left foot: Secondary | ICD-10-CM

## 2022-06-20 DIAGNOSIS — R2689 Other abnormalities of gait and mobility: Secondary | ICD-10-CM

## 2022-06-24 ENCOUNTER — Ambulatory Visit: Payer: Medicaid Other | Admitting: Physical Therapy

## 2022-06-27 ENCOUNTER — Other Ambulatory Visit: Payer: Self-pay

## 2022-06-27 ENCOUNTER — Encounter: Payer: Self-pay | Admitting: Physical Therapy

## 2022-06-27 ENCOUNTER — Ambulatory Visit: Payer: Medicaid Other | Admitting: Physical Therapy

## 2022-06-27 DIAGNOSIS — M25572 Pain in left ankle and joints of left foot: Secondary | ICD-10-CM | POA: Diagnosis not present

## 2022-06-27 DIAGNOSIS — R2689 Other abnormalities of gait and mobility: Secondary | ICD-10-CM

## 2022-06-27 DIAGNOSIS — M6281 Muscle weakness (generalized): Secondary | ICD-10-CM

## 2022-06-27 NOTE — Therapy (Signed)
OUTPATIENT PHYSICAL THERAPY TREATMENT NOTE   Patient Name: Shawn Harper MRN: [id number] DOB:09/16/1995, 26 y.o., male Today's Date: 06/27/2022  PCP: Marin Olp, MD   REFERRING PROVIDER: [name], DPM   END OF SESSION:   PT End of Session - 06/27/22 0943     Visit Number 6    Number of Visits 17    Date for PT Re-Evaluation 07/31/22    Authorization Type [employer]    PT Start Time 0915    PT Stop Time 1000    PT Time Calculation (min) 45 min    Activity Tolerance Patient tolerated treatment well    Behavior During Therapy WFL for tasks assessed/performed                 Past Medical History:  Diagnosis Date   Anxiety    Depression    Dysrhythmia    Headache    Smoker    5 pack years, trying to quit   Past Surgical History:  Procedure Laterality Date   APPLICATION OF WOUND VAC  03/08/2022   Procedure: APPLICATION OF WOUND VAC;  Surgeon: [name], DPM;  Location: [facility];  Service: Podiatry;;   FLAT FOOT RECONSTRUCTION-TAL GASTROC RECESSION Left 03/08/2022   Procedure: FLAT FOOT RECONSTRUCTION WITH FUSION OF JOINTS IN BACK OF FOOT; POSSIBLE CALF MUSCLE LENGTHENING;BONEGRAFT INTO FOOT TO BUILD ARCH; BONEGRAFT FROM LEG;  Surgeon: [name], DPM;  Location: [facility];  Service: Podiatry;  Laterality: Left;  POPLITEL AND SAPHENOUS BLOCK   LEG SURGERY     within a month of birth, in cast for infancy   Patient Active Problem List   Diagnosis Date Noted   Coalition, talocalcaneal    Osteoarthritis of left ankle and foot    Smoker 01/28/2021   Unprotected sex 10/07/2014   GAD (generalized anxiety disorder) 10/07/2014   Depression 10/07/2014   Former smoker     REFERRING DIAG: Pes planus, rigid, left; Coalition, talocalcaneal  THERAPY DIAG:  Pain in left ankle and joints of left foot  Muscle weakness (generalized)  Other abnormalities of gait and mobility  Rationale for Evaluation and Treatment Rehabilitation  PERTINENT  HISTORY: Flat foot reconstruction with subtalar and talonavicular fusion, achilles lengthening   PRECAUTIONS: None   SUBJECTIVE:             SUBJECTIVE STATEMENT:  Patient reports he is having more pain on the front/top of his ankle when he walks so he has to toe out when walking. Still having the normal ball of the foot pain with walking.  PAIN:  Are you having pain? Yes:  NPRS scale: 6/10 Pain location: Left metatarsals heads and dorsal aspect of the foot  Pain description: "like you have a rock in your shoe"; pressure  Aggravating factors: Walking, standing (weight bearing) Relieving factors: Rest, sitting   PATIENT GOALS: Walking normally without pain, hiking, working out   OBJECTIVE: (objective measures completed at initial evaluation unless otherwise dated) PATIENT SURVEYS:  LEFS 33/80   MUSCLE LENGTH: Calf flexibility deficit   POSTURE:              Patient rests in 5 deg of eversion   PALPATION:  Tender to plantar aspect of foot especially around navicular and met head regions   LOWER EXTREMITY ROM:   Active ROM Right eval Left eval Left 06/14/2022 Left 06/27/2022  Ankle dorsiflexion - 4 lacking 0 0  Ankle plantarflexion - 30 deg    Ankle inversion -  0    Ankle eversion - 5      LOWER EXTREMITY MMT:   MMT Right eval Left eval Left 06/20/2022  Ankle dorsiflexion - 4 4  Ankle plantarflexion - 2 2  Ankle inversion - -   Ankle eversion - -     GAIT: Distance walked: 30 ft Assistive device utilized: None Level of assistance: Complete Independence Comments: patient ambulating with CAM boot and ambulation not assessed without boot, antalgic gait on left     TODAY'S TREATMENT: [facility] Adult PT Treatment:                                                DATE: 06/27/22 Therapeutic Exercise: Longsitting calf stretch 3 x 30 sec Longsitting ankle PF and DF with blue 3 x 20 Standing heel raises on Airex 2 x 20 Standing ankle DF at wall 2 x 20 Manual  Therapy: Left talocrural joints mobs Left ankle DF MWM with belt 3 x 10 Modalities: Iontophoresis extended release patch, dexamethasone 1 mL, 80 mA-minutes   [facility] Adult PT Treatment:                                                DATE: 06/20/22 Therapeutic Exercise: Longsitting calf stretch 3 x 30 sec Seated rockerboard A/P x 20  Seated great toe extension x 20 Longsitting PF with blue 2 x 20 Seated heel raises with 20# 2 x 20 Standing ankle DF at wall 2 x 10 Manual Therapy: STM anterior tib, posterior tib, gastroc/soleus, plantar fascia Passive gastroc/soleus stretch Talocrurual mobs all directions Trigger Point Dry Needling Treatment: Pre-treatment instruction: Patient instructed on dry needling rationale, procedures, and possible side effects including pain during treatment (achy,cramping feeling), bruising, drop of blood, lightheadedness, nausea, sweating. Patient Consent Given: Yes Education handout provided: No Muscles treated: Right posterior tib, soleus, anterior tib Needle size and number: .30x70m x 3, .30x345mx 2 Electrical stimulation performed: No Parameters: N/A Treatment response/outcome: Twitch response elicited, Palpable decrease in muscle tension, and patient reporting improvement in tightness Post-treatment instructions: Patient instructed to expect possible mild to moderate muscle soreness later today and/or tomorrow. Patient instructed in methods to reduce muscle soreness and to continue prescribed HEP. If patient was dry needled over the lung field, patient was instructed on signs and symptoms of pneumothorax and, however unlikely, to see immediate medical attention should they occur. Patient was also educated on signs and symptoms of infection and to seek medical attention should they occur. Patient verbalized understanding of these instructions and education.  [facility]:                                                DATE: 06/18/22 Therapeutic  Exercise: Seated rockerboard A/P 2 x 10  Great toe extension 2 x 10 Lesser toe extension attempted unable  Toe curls 2 x 10  Seated ankle eversion AROM 2 x 10; stabilization at tibia  Seated ankle inversion AROM 2 x 10 Seated calf raise 2 x 15; 15 lbs  Updated HEP  Manual Therapy: STM anterior tib, posterior tib,  gastroc/soleus, plantar fascia Passive gastroc/soleus stretch MT glides  PATIENT EDUCATION:  Education details: HEP, iontophoresis Person educated: Patient Education method: Explanation, Demonstration, Tactile cues, Verbal cues Education comprehension: verbalized understanding, returned demonstration, verbal cues required, tactile cues required, and needs further education   HOME EXERCISE PROGRAM: Access Code: [email or web address]     ASSESSMENT: CLINICAL IMPRESSION: Patient tolerated therapy well with no adverse effects. He continues to report anterior ankle pain with weight bearing DF and push-off with gait. Trialed ankle DF MWM with belt with moderate therapeutic benefit and patient did report incerased anterior ankle pain with weight bearing heel raises and DF. Trial of iontophoresis of anterior ankle (ant tip and hallucis longus tendons). No changes to HEP this visit. Patient would benefit from continued skilled PT to progress his mobility and strengthening in order to reduce pain, walking tolerance, and maximize functional ability.      OBJECTIVE IMPAIRMENTS: Abnormal gait, decreased activity tolerance, decreased balance, difficulty walking, decreased ROM, decreased strength, impaired flexibility, and pain.    ACTIVITY LIMITATIONS: standing, squatting, stairs, and locomotion level   PARTICIPATION LIMITATIONS: meal prep, cleaning, shopping, community activity, occupation, and yard work   PERSONAL FACTORS: Past/current experiences and Time since onset of injury/illness/exacerbation are also affecting patient's functional outcome.      GOALS: Goals reviewed with patient? Yes    SHORT TERM GOALS: Target date: 07/03/2022    Patient will be I with initial HEP in order to progress with therapy. Baseline: HEP provided at eval Goal status: INITIAL   2.  Patient will demonstrate >/= 0 deg left ankle DF to improve gait with normal footwear to wean from CAM boot and allow for improve household ambulation Baseline: lacking 4 deg Goal status: INITIAL   3.  Patient will report left ankle and foot pain </= 5/10 with weight bearing using normal footwear to improved standing and walking ability  Baseline: 8-10/10 pain with walking Goal status: INITIAL   LONG TERM GOALS: Target date: 07/31/2022    Patient will be I with final HEP to maintain progress from PT. Baseline: HEP provided at eval Goal status: INITIAL   2.  Patient will report LEFS >/= 60/80 in order to indicate improved functional status and return to walking and household activities without limitation Baseline: 33/80 Goal status: INITIAL   3.  Patient will demonstrate >/= 5 deg left ankle DF to allow for improved gait mechanics and stair negotiation for improved community access Baseline: lacking 4 deg Goal status: INITIAL   4.  Patient will exhibit left ankle strength grossly >/= 4/5 MMT within available ranges to improve walking and standing tolerance so patient can return to prior activities such as hiking Baseline: patient demonstrates significant ankle strength deficits Goal status: INITIAL   5.  Patient will report left ankle pain </= 2/10 with walking community level distances in order to improve community access and grocery shopping ability Baseline: 8-10/10 pain with walking Goal status: INITIAL     PLAN: PT FREQUENCY: 1-2x/week   PT DURATION: 8 weeks   PLANNED INTERVENTIONS: Therapeutic exercises, Therapeutic activity, Neuromuscular re-education, Balance training, Gait training, Patient/Family education, Self Care, Joint mobilization, Joint manipulation, Stair training, Aquatic Therapy, Dry  Needling, Electrical stimulation, Cryotherapy, Moist heat, Taping, Ionotophoresis 61m/ml Dexamethasone, Manual therapy, and Re-evaluation   PLAN FOR NEXT SESSION: Review HEP and progress PRN, manual/mobs for talocrural and improving ankle and calf mobility, progress ankle strengthening, gait training and balance PRN, aquatics?; consider TPDN to posterior tib.    CHilda Blades PT,  DPT, LAT, ATC 06/27/22  11:29 AM Phone: [phone] Fax: [phone]

## 2022-07-01 NOTE — Therapy (Addendum)
OUTPATIENT PHYSICAL THERAPY TREATMENT NOTE  DISCHARGE   Patient Name: Shawn Harper MRN: [id number] DOB:July 21, 1996, 26 y.o., male Today's Date: 07/02/2022  PCP: Marin Olp, MD   REFERRING PROVIDER: [name], DPM   END OF SESSION:   PT End of Session - 07/02/22 1029     Visit Number 7    Number of Visits 17    Date for PT Re-Evaluation 07/31/22    Authorization Type [employer]    PT Start Time 1000    PT Stop Time 1040    PT Time Calculation (min) 40 min    Activity Tolerance Patient tolerated treatment well    Behavior During Therapy WFL for tasks assessed/performed                  Past Medical History:  Diagnosis Date   Anxiety    Depression    Dysrhythmia    Headache    Smoker    5 pack years, trying to quit   Past Surgical History:  Procedure Laterality Date   APPLICATION OF WOUND VAC  03/08/2022   Procedure: APPLICATION OF WOUND VAC;  Surgeon: [name], DPM;  Location: [facility];  Service: Podiatry;;   FLAT FOOT RECONSTRUCTION-TAL GASTROC RECESSION Left 03/08/2022   Procedure: FLAT FOOT RECONSTRUCTION WITH FUSION OF JOINTS IN BACK OF FOOT; POSSIBLE CALF MUSCLE LENGTHENING;BONEGRAFT INTO FOOT TO BUILD ARCH; BONEGRAFT FROM LEG;  Surgeon: [name], DPM;  Location: [facility];  Service: Podiatry;  Laterality: Left;  POPLITEL AND SAPHENOUS BLOCK   LEG SURGERY     within a month of birth, in cast for infancy   Patient Active Problem List   Diagnosis Date Noted   Coalition, talocalcaneal    Osteoarthritis of left ankle and foot    Smoker 01/28/2021   Unprotected sex 10/07/2014   GAD (generalized anxiety disorder) 10/07/2014   Depression 10/07/2014   Former smoker     REFERRING DIAG: Pes planus, rigid, left; Coalition, talocalcaneal  THERAPY DIAG:  Pain in left ankle and joints of left foot  Muscle weakness (generalized)  Other abnormalities of gait and mobility  Rationale for Evaluation and Treatment  Rehabilitation  PERTINENT HISTORY: Flat foot reconstruction with subtalar and talonavicular fusion, achilles lengthening   PRECAUTIONS: None   SUBJECTIVE:             SUBJECTIVE STATEMENT:  Patient reports he continues to have pain on the front of the ankle with all walking, and the belt exercise from last visit really aggravated the ankle where he couldn't tell if the patch worked.  PAIN:  Are you having pain? Yes:  NPRS scale: 7/10 Pain location: Anterior ankle, left metatarsals heads and dorsal aspect of the foot  Pain description: sharp, "like you have a rock in your shoe"; pressure  Aggravating factors: Walking, standing (weight bearing) Relieving factors: Rest, sitting   PATIENT GOALS: Walking normally without pain, hiking, working out   OBJECTIVE: (objective measures completed at initial evaluation unless otherwise dated) PATIENT SURVEYS:  LEFS 33/80   MUSCLE LENGTH: Calf flexibility deficit   POSTURE:              Patient rests in 5 deg of eversion   PALPATION:  Tender to plantar aspect of foot especially around navicular and met head regions   LOWER EXTREMITY ROM:   Active ROM Right eval Left eval Left 06/14/2022 Left 06/27/2022  Ankle dorsiflexion - 4 lacking 0 0  Ankle plantarflexion - 30  deg    Ankle inversion - 0    Ankle eversion - 5      LOWER EXTREMITY MMT:   MMT Right eval Left eval Left 06/20/2022  Ankle dorsiflexion - 4 4  Ankle plantarflexion - 2 2  Ankle inversion - -   Ankle eversion - -     GAIT: Distance walked: 30 ft Assistive device utilized: None Level of assistance: Complete Independence Comments: patient ambulating with CAM boot and ambulation not assessed without boot, antalgic gait on left     TODAY'S TREATMENT: [facility] Adult PT Treatment:                                                DATE: 07/01/22 Therapeutic Exercise: Longsitting calf stretch with towel 3 x 30 sec Seated rockerboard ankle DF/PF 2 x 20 Longsitting ankle  PF and DF with blue 3 x 20 Manual Therapy: Left talocrural joints mobs, AP/PA/distraction STM / TPR left calf Modalities: Iontophoresis extended release patch, dexamethasone 1 mL, 80 mA-minutes   [facility] Adult PT Treatment:                                                DATE: 06/27/22 Therapeutic Exercise: Longsitting calf stretch 3 x 30 sec Longsitting ankle PF and DF with blue 3 x 20 Standing heel raises on Airex 2 x 20 Standing ankle DF at wall 2 x 20 Manual Therapy: Left talocrural joints mobs Left ankle DF MWM with belt 3 x 10 Modalities: Iontophoresis extended release patch, dexamethasone 1 mL, 80 mA-minutes  [facility] Adult PT Treatment:                                                DATE: 06/20/22 Therapeutic Exercise: Longsitting calf stretch 3 x 30 sec Seated rockerboard A/P x 20  Seated great toe extension x 20 Longsitting PF with blue 2 x 20 Seated heel raises with 20# 2 x 20 Standing ankle DF at wall 2 x 10 Manual Therapy: STM anterior tib, posterior tib, gastroc/soleus, plantar fascia Passive gastroc/soleus stretch Talocrurual mobs all directions Trigger Point Dry Needling Treatment: Pre-treatment instruction: Patient instructed on dry needling rationale, procedures, and possible side effects including pain during treatment (achy,cramping feeling), bruising, drop of blood, lightheadedness, nausea, sweating. Patient Consent Given: Yes Education handout provided: No Muscles treated: Right posterior tib, soleus, anterior tib Needle size and number: .30x13m x 3, .30x363mx 2 Electrical stimulation performed: No Parameters: N/A Treatment response/outcome: Twitch response elicited, Palpable decrease in muscle tension, and patient reporting improvement in tightness Post-treatment instructions: Patient instructed to expect possible mild to moderate muscle soreness later today and/or tomorrow. Patient instructed in methods to reduce muscle soreness and to continue prescribed  HEP. If patient was dry needled over the lung field, patient was instructed on signs and symptoms of pneumothorax and, however unlikely, to see immediate medical attention should they occur. Patient was also educated on signs and symptoms of infection and to seek medical attention should they occur. Patient verbalized understanding of these instructions and education.  PATIENT EDUCATION:  Education details: HEP,  iontophoresis Person educated: Patient Education method: Explanation, Demonstration, Tactile cues, Verbal cues Education comprehension: verbalized understanding, returned demonstration, verbal cues required, tactile cues required, and needs further education   HOME EXERCISE PROGRAM: Access Code: [email or web address]     ASSESSMENT: CLINICAL IMPRESSION: Patient tolerated therapy well with no adverse effects. Therapy regressed slightly this visit with no weight bearing exercises performed due to patient reporting increased pain. He pain remains at the anterior talocrural joint and anterior tibialis tendon region. Therapy focused on progressing ankle mobility and strength, focusing more on manual. Continued to trial iontophoresis this visit and will reassess tomorrow at his next appointment. Patient would benefit from continued skilled PT to progress his mobility and strengthening in order to reduce pain, walking tolerance, and maximize functional ability.      OBJECTIVE IMPAIRMENTS: Abnormal gait, decreased activity tolerance, decreased balance, difficulty walking, decreased ROM, decreased strength, impaired flexibility, and pain.    ACTIVITY LIMITATIONS: standing, squatting, stairs, and locomotion level   PARTICIPATION LIMITATIONS: meal prep, cleaning, shopping, community activity, occupation, and yard work   PERSONAL FACTORS: Past/current experiences and Time since onset of injury/illness/exacerbation are also affecting patient's functional outcome.      GOALS: Goals reviewed with patient?  Yes   SHORT TERM GOALS: Target date: 07/03/2022    Patient will be I with initial HEP in order to progress with therapy. Baseline: HEP provided at eval Goal status: INITIAL   2.  Patient will demonstrate >/= 0 deg left ankle DF to improve gait with normal footwear to wean from CAM boot and allow for improve household ambulation Baseline: lacking 4 deg Goal status: INITIAL   3.  Patient will report left ankle and foot pain </= 5/10 with weight bearing using normal footwear to improved standing and walking ability  Baseline: 8-10/10 pain with walking Goal status: INITIAL   LONG TERM GOALS: Target date: 07/31/2022    Patient will be I with final HEP to maintain progress from PT. Baseline: HEP provided at eval Goal status: INITIAL   2.  Patient will report LEFS >/= 60/80 in order to indicate improved functional status and return to walking and household activities without limitation Baseline: 33/80 Goal status: INITIAL   3.  Patient will demonstrate >/= 5 deg left ankle DF to allow for improved gait mechanics and stair negotiation for improved community access Baseline: lacking 4 deg Goal status: INITIAL   4.  Patient will exhibit left ankle strength grossly >/= 4/5 MMT within available ranges to improve walking and standing tolerance so patient can return to prior activities such as hiking Baseline: patient demonstrates significant ankle strength deficits Goal status: INITIAL   5.  Patient will report left ankle pain </= 2/10 with walking community level distances in order to improve community access and grocery shopping ability Baseline: 8-10/10 pain with walking Goal status: INITIAL     PLAN: PT FREQUENCY: 1-2x/week   PT DURATION: 8 weeks   PLANNED INTERVENTIONS: Therapeutic exercises, Therapeutic activity, Neuromuscular re-education, Balance training, Gait training, Patient/Family education, Self Care, Joint mobilization, Joint manipulation, Stair training, Aquatic  Therapy, Dry Needling, Electrical stimulation, Cryotherapy, Moist heat, Taping, Ionotophoresis 67m/ml Dexamethasone, Manual therapy, and Re-evaluation   PLAN FOR NEXT SESSION: Review HEP and progress PRN, manual/mobs for talocrural and improving ankle and calf mobility, progress ankle strengthening, gait training and balance PRN, aquatics?; consider TPDN to posterior tib.    CHilda Blades PT, DPT, LAT, ATC 07/02/22  10:48 AM Phone: [phone]: [phone]   PHYSICAL THERAPY DISCHARGE  SUMMARY  Visits from Start of Care: 7  Current functional level related to goals / functional outcomes: See above   Remaining deficits: See above   Education / Equipment: HEP   Patient agrees to discharge. Patient goals were not met. Patient is being discharged due to a change in medical status.  Hilda Blades, PT, DPT, LAT, ATC 08/19/22  3:49 PM Phone: [phone] Fax: [phone]

## 2022-07-02 ENCOUNTER — Encounter: Payer: Self-pay | Admitting: Physical Therapy

## 2022-07-02 ENCOUNTER — Ambulatory Visit: Payer: Medicaid Other | Admitting: Physical Therapy

## 2022-07-02 ENCOUNTER — Other Ambulatory Visit: Payer: Self-pay

## 2022-07-02 DIAGNOSIS — R2689 Other abnormalities of gait and mobility: Secondary | ICD-10-CM

## 2022-07-02 DIAGNOSIS — M25572 Pain in left ankle and joints of left foot: Secondary | ICD-10-CM | POA: Diagnosis not present

## 2022-07-02 DIAGNOSIS — M6281 Muscle weakness (generalized): Secondary | ICD-10-CM

## 2022-07-02 NOTE — Therapy (Incomplete)
OUTPATIENT PHYSICAL THERAPY TREATMENT NOTE   Patient Name: Shawn Harper MRN: [id number] DOB:10/16/1995, 26 y.o., male Today's Date: 07/02/2022  PCP: Marin Olp, MD   REFERRING PROVIDER: [name], DPM   END OF SESSION:          Past Medical History:  Diagnosis Date   Anxiety    Depression    Dysrhythmia    Headache    Smoker    5 pack years, trying to quit   Past Surgical History:  Procedure Laterality Date   APPLICATION OF WOUND VAC  03/08/2022   Procedure: APPLICATION OF WOUND VAC;  Surgeon: [name], DPM;  Location: [facility];  Service: Podiatry;;   FLAT FOOT RECONSTRUCTION-TAL GASTROC RECESSION Left 03/08/2022   Procedure: FLAT FOOT RECONSTRUCTION WITH FUSION OF JOINTS IN BACK OF FOOT; POSSIBLE CALF MUSCLE LENGTHENING;BONEGRAFT INTO FOOT TO BUILD ARCH; BONEGRAFT FROM LEG;  Surgeon: [name], DPM;  Location: [facility];  Service: Podiatry;  Laterality: Left;  POPLITEL AND SAPHENOUS BLOCK   LEG SURGERY     within a month of birth, in cast for infancy   Patient Active Problem List   Diagnosis Date Noted   Coalition, talocalcaneal    Osteoarthritis of left ankle and foot    Smoker 01/28/2021   Unprotected sex 10/07/2014   GAD (generalized anxiety disorder) 10/07/2014   Depression 10/07/2014   Former smoker     REFERRING DIAG: Pes planus, rigid, left; Coalition, talocalcaneal  THERAPY DIAG:  No diagnosis found.  Rationale for Evaluation and Treatment Rehabilitation  PERTINENT HISTORY: Flat foot reconstruction with subtalar and talonavicular fusion, achilles lengthening   PRECAUTIONS: None   SUBJECTIVE:             SUBJECTIVE STATEMENT:  Patient reports he continues to have pain on the front of the ankle with all walking, and the belt exercise from last visit really aggravated the ankle where he couldn't tell if the patch worked.  PAIN:  Are you having pain? Yes:  NPRS scale: 7/10 Pain location: Anterior ankle, left  metatarsals heads and dorsal aspect of the foot  Pain description: sharp, "like you have a rock in your shoe"; pressure  Aggravating factors: Walking, standing (weight bearing) Relieving factors: Rest, sitting   PATIENT GOALS: Walking normally without pain, hiking, working out   OBJECTIVE: (objective measures completed at initial evaluation unless otherwise dated) PATIENT SURVEYS:  LEFS 33/80   MUSCLE LENGTH: Calf flexibility deficit   POSTURE:              Patient rests in 5 deg of eversion   PALPATION:  Tender to plantar aspect of foot especially around navicular and met head regions   LOWER EXTREMITY ROM:   Active ROM Right eval Left eval Left 06/14/2022 Left 06/27/2022  Ankle dorsiflexion - 4 lacking 0 0  Ankle plantarflexion - 30 deg    Ankle inversion - 0    Ankle eversion - 5      LOWER EXTREMITY MMT:   MMT Right eval Left eval Left 06/20/2022  Ankle dorsiflexion - 4 4  Ankle plantarflexion - 2 2  Ankle inversion - -   Ankle eversion - -     GAIT: Distance walked: 30 ft Assistive device utilized: None Level of assistance: Complete Independence Comments: patient ambulating with CAM boot and ambulation not assessed without boot, antalgic gait on left     TODAY'S TREATMENT: [facility] Adult PT Treatment:  DATE: 07/03/22 Therapeutic Exercise: Longsitting calf stretch with towel 3 x 30 sec Seated rockerboard ankle DF/PF 2 x 20 Longsitting ankle PF and DF with blue 3 x 20 Manual Therapy: Left talocrural joints mobs, AP/PA/distraction STM / TPR left calf Modalities: Iontophoresis extended release patch, dexamethasone 1 mL, 80 mA-minutes   [facility] Adult PT Treatment:                                                DATE: 07/02/22 Therapeutic Exercise: Longsitting calf stretch with towel 3 x 30 sec Seated rockerboard ankle DF/PF 2 x 20 Longsitting ankle PF and DF with blue 3 x 20 Manual Therapy: Left talocrural  joints mobs, AP/PA/distraction STM / TPR left calf Modalities: Iontophoresis extended release patch, dexamethasone 1 mL, 80 mA-minutes  [facility] Adult PT Treatment:                                                DATE: 06/27/22 Therapeutic Exercise: Longsitting calf stretch 3 x 30 sec Longsitting ankle PF and DF with blue 3 x 20 Standing heel raises on Airex 2 x 20 Standing ankle DF at wall 2 x 20 Manual Therapy: Left talocrural joints mobs Left ankle DF MWM with belt 3 x 10 Modalities: Iontophoresis extended release patch, dexamethasone 1 mL, 80 mA-minutes  PATIENT EDUCATION:  Education details: HEP Person educated: Patient Education method: Consulting civil engineer, Demonstration, Corporate treasurer cues, Verbal cues Education comprehension: verbalized understanding, returned demonstration, verbal cues required, tactile cues required, and needs further education   HOME EXERCISE PROGRAM: Access Code: [email or web address]     ASSESSMENT: CLINICAL IMPRESSION: Patient tolerated therapy well with no adverse effects. *** Patient would benefit from continued skilled PT to progress his mobility and strengthening in order to reduce pain, walking tolerance, and maximize functional ability.   Therapy regressed slightly this visit with no weight bearing exercises performed due to patient reporting increased pain. He pain remains at the anterior talocrural joint and anterior tibialis tendon region. Therapy focused on progressing ankle mobility and strength, focusing more on manual. Continued to trial iontophoresis this visit and will reassess tomorrow at his next appointment.      OBJECTIVE IMPAIRMENTS: Abnormal gait, decreased activity tolerance, decreased balance, difficulty walking, decreased ROM, decreased strength, impaired flexibility, and pain.    ACTIVITY LIMITATIONS: standing, squatting, stairs, and locomotion level   PARTICIPATION LIMITATIONS: meal prep, cleaning, shopping, community activity, occupation, and yard  work   PERSONAL FACTORS: Past/current experiences and Time since onset of injury/illness/exacerbation are also affecting patient's functional outcome.      GOALS: Goals reviewed with patient? Yes   SHORT TERM GOALS: Target date: 07/03/2022    Patient will be I with initial HEP in order to progress with therapy. Baseline: HEP provided at eval Goal status: INITIAL   2.  Patient will demonstrate >/= 0 deg left ankle DF to improve gait with normal footwear to wean from CAM boot and allow for improve household ambulation Baseline: lacking 4 deg Goal status: INITIAL   3.  Patient will report left ankle and foot pain </= 5/10 with weight bearing using normal footwear to improved standing and walking ability  Baseline: 8-10/10 pain with walking Goal status: INITIAL   LONG TERM GOALS:  Target date: 07/31/2022    Patient will be I with final HEP to maintain progress from PT. Baseline: HEP provided at eval Goal status: INITIAL   2.  Patient will report LEFS >/= 60/80 in order to indicate improved functional status and return to walking and household activities without limitation Baseline: 33/80 Goal status: INITIAL   3.  Patient will demonstrate >/= 5 deg left ankle DF to allow for improved gait mechanics and stair negotiation for improved community access Baseline: lacking 4 deg Goal status: INITIAL   4.  Patient will exhibit left ankle strength grossly >/= 4/5 MMT within available ranges to improve walking and standing tolerance so patient can return to prior activities such as hiking Baseline: patient demonstrates significant ankle strength deficits Goal status: INITIAL   5.  Patient will report left ankle pain </= 2/10 with walking community level distances in order to improve community access and grocery shopping ability Baseline: 8-10/10 pain with walking Goal status: INITIAL     PLAN: PT FREQUENCY: 1-2x/week   PT DURATION: 8 weeks   PLANNED INTERVENTIONS: Therapeutic  exercises, Therapeutic activity, Neuromuscular re-education, Balance training, Gait training, Patient/Family education, Self Care, Joint mobilization, Joint manipulation, Stair training, Aquatic Therapy, Dry Needling, Electrical stimulation, Cryotherapy, Moist heat, Taping, Ionotophoresis 42m/ml Dexamethasone, Manual therapy, and Re-evaluation   PLAN FOR NEXT SESSION: Review HEP and progress PRN, manual/mobs for talocrural and improving ankle and calf mobility, progress ankle strengthening, gait training and balance PRN, aquatics?; consider TPDN to posterior tib.    CHilda Blades PT, DPT, LAT, ATC 07/02/22  3:54 PM Phone: [phone]: [phone]

## 2022-07-03 ENCOUNTER — Ambulatory Visit: Payer: Medicaid Other | Admitting: Physical Therapy

## 2022-07-10 ENCOUNTER — Ambulatory Visit: Payer: Medicaid Other | Admitting: Physical Therapy

## 2022-07-12 ENCOUNTER — Ambulatory Visit: Payer: Medicaid Other | Admitting: Physical Therapy

## 2022-07-16 ENCOUNTER — Ambulatory Visit (INDEPENDENT_AMBULATORY_CARE_PROVIDER_SITE_OTHER): Payer: Medicaid Other | Admitting: Podiatry

## 2022-07-16 ENCOUNTER — Ambulatory Visit (INDEPENDENT_AMBULATORY_CARE_PROVIDER_SITE_OTHER): Payer: Medicaid Other

## 2022-07-16 DIAGNOSIS — Q6689 Other  specified congenital deformities of feet: Secondary | ICD-10-CM | POA: Diagnosis not present

## 2022-07-16 DIAGNOSIS — Q6652 Congenital pes planus, left foot: Secondary | ICD-10-CM

## 2022-07-16 DIAGNOSIS — M25872 Other specified joint disorders, left ankle and foot: Secondary | ICD-10-CM | POA: Diagnosis not present

## 2022-07-16 DIAGNOSIS — M7752 Other enthesopathy of left foot: Secondary | ICD-10-CM

## 2022-07-16 NOTE — Progress Notes (Signed)
  Subjective:  Patient ID: Shawn Harper, male    DOB: 04/03/1996,  MRN: [id number]  Chief Complaint  Patient presents with   Foot Pain    (xray)left foot pain-previous sx     26 y.o. male returns for post-op check.  He is doing well.  He is 4 months out from surgery now.  He is having his worst pain under the big toe joint when he walks he was not able to tolerate the inserts he had at his last appointment.  Still wearing the ankle brace.  Notes pain across the inside of the ankle top of the ankles as well  Review of Systems: Negative except as noted in the HPI. Denies N/V/F/Ch.   Objective:  There were no vitals filed for this visit. There is no height or weight on file to calculate BMI. Constitutional Well developed. Well nourished.  Vascular Foot warm and well perfused. Capillary refill normal to all digits.  Calf is soft and supple, no posterior calf or knee pain, negative Homans' sign  Neurologic Normal speech. Oriented to person, place, and time. Epicritic sensation to light touch grossly present bilaterally.  Dermatologic Incisions are well-healed and not hypertrophic  Orthopedic: Soft tissue swelling significantly improved.  Most of his pain today is centered around the sesamoid complex as well as the anterior ankle joint dorsal to the talonavicular joint   Multiple view plain film radiographs: Full consolidation of fusion across arthrodesis sites noted, no complication of hardware still has some valgus deformity, there is a dorsal spur on the talus, he has a single sesamoid which appears slightly sclerotic Assessment:   1. Pes planus, rigid, left   2. Coalition, talocalcaneal   3. Sesamoiditis of left foot   4. Bone spur of left foot    Plan:  Patient was evaluated and treated and all questions answered.  S/p foot surgery left -Still having quite a bit of pain and this is now limiting his therapy and recovery process.  I recommend a CT scan to evaluate the full extent  of healing we may want to consider hardware removal and exostectomy of the dorsal talar spur.  He also has some signs and symptoms of sesamoiditis which I think likely are going to need a long-term orthotic or brace he likely will need a custom molded orthosis, this is difficult due to Medicaid not being able to pay for this.  I may be able to adjust or pad his previous orthosis at some point if its not improving.  CT is ordered and I will see him back after for possible surgical planning and review  Return for after CT to review.

## 2022-07-16 NOTE — Patient Instructions (Signed)
Call [facility] to schedule your CT at the below locations.  Please allow at least 1 business day after your visit to process the referral.  It may take longer depending on approval from insurance.  Please let me know if you have issues or problems scheduling the CT     [facility] [phone] [address] [place], [place] [address]

## 2022-07-17 ENCOUNTER — Telehealth: Payer: Self-pay | Admitting: *Deleted

## 2022-07-17 NOTE — Telephone Encounter (Signed)
Patient's wife is calling for status of CT scheduling, reached out to [facility], still processing and will give the patient a call sometimes today,explained this to her and was given their number to contact if needed.

## 2022-07-18 ENCOUNTER — Ambulatory Visit
Admission: RE | Admit: 2022-07-18 | Discharge: 2022-07-18 | Disposition: A | Discharge status: Home / Self Care | Payer: Medicaid Other | Source: Ambulatory Visit | Attending: Podiatry | Admitting: Podiatry

## 2022-07-18 DIAGNOSIS — Q6652 Congenital pes planus, left foot: Secondary | ICD-10-CM

## 2022-07-18 DIAGNOSIS — Q6689 Other  specified congenital deformities of feet: Secondary | ICD-10-CM

## 2022-07-19 ENCOUNTER — Ambulatory Visit: Payer: Medicaid Other | Admitting: Physical Therapy

## 2022-07-22 ENCOUNTER — Ambulatory Visit: Payer: Medicaid Other | Admitting: Physical Therapy

## 2022-07-23 ENCOUNTER — Telehealth: Payer: Self-pay | Admitting: Podiatry

## 2022-07-23 NOTE — Telephone Encounter (Signed)
Pt called in to inquire about his CT scan results. He wanted to know if an appt was necessary for the results. Please advise.

## 2022-07-24 ENCOUNTER — Encounter: Payer: Self-pay | Admitting: Podiatry

## 2022-07-24 NOTE — Telephone Encounter (Signed)
Noted, thanks!

## 2022-07-25 ENCOUNTER — Encounter: Payer: Self-pay | Admitting: *Deleted

## 2022-07-25 ENCOUNTER — Ambulatory Visit: Payer: Medicaid Other | Admitting: Physical Therapy

## 2022-08-01 ENCOUNTER — Ambulatory Visit (INDEPENDENT_AMBULATORY_CARE_PROVIDER_SITE_OTHER): Payer: Medicaid Other | Admitting: Podiatry

## 2022-08-01 DIAGNOSIS — M19072 Primary osteoarthritis, left ankle and foot: Secondary | ICD-10-CM

## 2022-08-06 ENCOUNTER — Encounter: Payer: Self-pay | Admitting: Podiatry

## 2022-08-06 NOTE — Progress Notes (Signed)
    Subjective:  Patient ID: Shawn Harper, male    DOB: May 03, 1996,  MRN: [id number]  Chief Complaint  Patient presents with   Flat Foot    Follow up on foot - ankle injection      26 y.o. male returns for post-op check.  He completed the CT scan  Review of Systems: Negative except as noted in the HPI. Denies N/V/F/Ch.   Objective:  There were no vitals filed for this visit. There is no height or weight on file to calculate BMI. Constitutional Well developed. Well nourished.  Vascular Foot warm and well perfused. Capillary refill normal to all digits.  Calf is soft and supple, no posterior calf or knee pain, negative Homans' sign  Neurologic Normal speech. Oriented to person, place, and time. Epicritic sensation to light touch grossly present bilaterally.  Dermatologic Incisions are well-healed and not hypertrophic  Orthopedic: Soft tissue swelling significantly improved.  Most of his pain today is centered around the sesamoid complex as well as the anterior ankle joint dorsal to the talonavicular joint   Multiple view plain film radiographs: Full consolidation of fusion across arthrodesis sites noted, no complication of hardware still has some valgus deformity, there is a dorsal spur on the talus, he has a single sesamoid which appears slightly sclerotic   CT 07/18/22 IMPRESSION: 1. 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 2. No acute osseous findings. 3. Scarring or inflammation medial and lateral to the subtalar joint without focal fluid collection. The medial flexor and peroneal tendons are partially obscured by this. 4. Previous resection of the tibial sesamoid of the 1st metatarsal. No acute forefoot findings identified.     Electronically Signed   By: Richardean Sale M.D.   On: 07/18/2022 16:08  Assessment:   1. Arthritis of left ankle    Plan:  Patient was  evaluated and treated and all questions answered.  S/p foot surgery left -We reviewed the results of his CT scan.  He does have some remaining bone healing in the anterodorsal lateral portion of the talonavicular joint.  No complication of hardware is noted.  We discussed the continued bone healing.  Noninvasive bone stimulation may help this at some point.  This likely will not be covered until 9 months after surgery.  My review of the CT scan shows a large tibial plafond osteochondral lesion likely which is adaptive change from the rigid hindfoot for so many years.  He likely will develop long-term arthritis here.  I recommended corticosteroid injection which will be both diagnostic and therapeutic.  He agreed and following sterile prep with Betadine 20 mg of Kenalog and 4 mg of dexamethasone with 1 cc of 0.5% Marcaine plain was injected into the left ankle joint through a anteromedial approach.  He tolerated it well  No follow-ups on file.

## 2022-09-17 ENCOUNTER — Encounter: Payer: Self-pay | Admitting: Podiatry

## 2022-09-17 ENCOUNTER — Ambulatory Visit (INDEPENDENT_AMBULATORY_CARE_PROVIDER_SITE_OTHER): Payer: Medicaid Other | Admitting: Podiatry

## 2022-09-17 VITALS — BP 130/89 | HR 77

## 2022-09-17 DIAGNOSIS — Q6689 Other  specified congenital deformities of feet: Secondary | ICD-10-CM | POA: Diagnosis not present

## 2022-09-17 DIAGNOSIS — G5792 Unspecified mononeuropathy of left lower limb: Secondary | ICD-10-CM

## 2022-09-17 DIAGNOSIS — Q6652 Congenital pes planus, left foot: Secondary | ICD-10-CM

## 2022-09-17 DIAGNOSIS — M19072 Primary osteoarthritis, left ankle and foot: Secondary | ICD-10-CM | POA: Diagnosis not present

## 2022-09-17 MED ORDER — GABAPENTIN 300 MG PO CAPS: 300.0000 mg | ORAL_CAPSULE | Freq: Two times a day (BID) | ORAL | 0 refills | Status: DC

## 2022-09-17 NOTE — Progress Notes (Signed)
    Subjective:  Patient ID: Shawn Harper, male    DOB: [birth year],  MRN: [id number]  Chief Complaint  Patient presents with   Arthritis    6 week follow up left foot/ankle pain     27 y.o. male returns for post-op check.  He states he feels about the same the injection did not help much, the foot feels still very tight and stiff and he is unable to do many activities  Review of Systems: Negative except as noted in the HPI. Denies N/V/F/Ch.   Objective:   Vitals:   09/17/22 0904  BP: 130/89  Pulse: 77   There is no height or weight on file to calculate BMI. Constitutional Well developed. Well nourished.  Vascular Foot warm and well perfused. Capillary refill normal to all digits.  Calf is soft and supple, no posterior calf or knee pain, negative Homans' sign  Neurologic Normal speech. Oriented to person, place, and time. Epicritic sensation to light touch grossly present bilaterally with the exception of the lesser toes which feel numb.  Dermatologic Incisions are well-healed and not hypertrophic  Orthopedic: No soft tissue swelling.  Tenderness in the plantar forefoot to palpation.  Mild pain over the TN joint   Multiple view plain film radiographs: Full consolidation of fusion across arthrodesis site and subtalar joint, some dorsal talonavicular has not fully fused, no complication of hardware still has some valgus deformity, there is a dorsal spur on the talus, he has a single sesamoid which appears slightly sclerotic   CT 07/18/22 IMPRESSION: 1. 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 2. No acute osseous findings. 3. Scarring or inflammation medial and lateral to the subtalar joint without focal fluid collection. The medial flexor and peroneal tendons are partially obscured by this. 4. Previous resection of the tibial sesamoid of the 1st metatarsal. No acute forefoot  findings identified.     Electronically Signed   By: Richardean Sale M.D.   On: 07/18/2022 16:08  Assessment:   1. Arthritis of left ankle   2. Pes planus, rigid, left   3. Coalition, talocalcaneal   4. Neuritis of left foot    Plan:  Patient was evaluated and treated and all questions answered.  S/p foot surgery left -So far did not have much improvement with an injection of the ankle.  His symptoms still seem to be more related to neuritis and deconditioning.  I did recommend using a supportive arch support and shoe.  He requested a refill of the gabapentin.  Rx for 90-day supply for twice daily dosing sent to pharmacy he will use this.  Taking Cymbalta now as well hopefully this helps with some neuritic pain.  I think long-term he is going to need a custom molded orthosis to support this, this is not something he can financially afford currently.  There is still some delayed union on his dorsal talonavicular joint from his CT scan, we will rex-ray this at the 22-monthmark to see if we can get approval for a noninvasive bone stimulation device.  Long-term right now I do not see an indication to plan for revision, we could consider possible cotton osteotomy and medial slide to improve the valgus rotation of the calcaneus.  We will reassess and rediscuss this at his next visit  Return in about 3 months (around 12/16/2022) for surgery followup (new left foot xrays, ankle xray, calc axial).

## 2022-12-11 ENCOUNTER — Ambulatory Visit (INDEPENDENT_AMBULATORY_CARE_PROVIDER_SITE_OTHER): Payer: Medicaid Other | Admitting: Podiatry

## 2022-12-11 ENCOUNTER — Ambulatory Visit (INDEPENDENT_AMBULATORY_CARE_PROVIDER_SITE_OTHER): Payer: Medicaid Other

## 2022-12-11 DIAGNOSIS — M19072 Primary osteoarthritis, left ankle and foot: Secondary | ICD-10-CM

## 2022-12-11 DIAGNOSIS — M960 Pseudarthrosis after fusion or arthrodesis: Secondary | ICD-10-CM | POA: Diagnosis not present

## 2022-12-11 DIAGNOSIS — Q6689 Other  specified congenital deformities of feet: Secondary | ICD-10-CM

## 2022-12-11 DIAGNOSIS — Q6652 Congenital pes planus, left foot: Secondary | ICD-10-CM

## 2022-12-11 DIAGNOSIS — M79672 Pain in left foot: Secondary | ICD-10-CM

## 2022-12-11 DIAGNOSIS — G8929 Other chronic pain: Secondary | ICD-10-CM

## 2022-12-12 MED ORDER — GABAPENTIN 300 MG PO CAPS: 600.0000 mg | ORAL_CAPSULE | Freq: Two times a day (BID) | ORAL | 0 refills | Status: DC

## 2022-12-12 NOTE — Progress Notes (Signed)
Subjective:  Patient ID: Shawn Harper, male    DOB: Jun 13, 1996,  MRN: [id number]  Chief Complaint  Patient presents with   Arthritis    Pain management( left foot pain)     27 y.o. male returns for post-op check.  He returns for follow-up says he feels like he is 10 times worse than it was before surgery.  Still wearing the Tri-Lock ankle brace.  He describes pain around the hindfoot worse with ambulation, worse with attempted dorsiflexion of the foot during ambulation, he also is still experiencing burning shooting pain on the top of the foot into the toes and the bottom of the foot into the toes.  Still having sensitivity to hot and cold at the foot 2.  Review of Systems: Negative except as noted in the HPI. Denies N/V/F/Ch.   Objective:   There were no vitals filed for this visit.  There is no height or weight on file to calculate BMI. Constitutional Well developed. Well nourished.  Vascular Foot warm and well perfused. Capillary refill normal to all digits.  Calf is soft and supple, no posterior calf or knee pain, negative Homans' sign  Neurologic Normal speech. Oriented to person, place, and time. Epicritic sensation to light touch grossly present bilaterally with the exception of the lesser toes which feel numb.  Temperature and color symmetric here in the office today.  Dermatologic Incisions are well-healed and not hypertrophic  Orthopedic: No soft tissue swelling.  Tenderness in the plantar forefoot to palpation.  Mild pain over the TN joint   Multiple view plain film radiographs: New films taken today show still persistent lucency at the dorsal talonavicular fusion site, good fusion across subtalar joint   CT 07/18/22 IMPRESSION: 1. 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 2. No acute osseous findings. 3. Scarring or inflammation medial and lateral to the subtalar  joint without focal fluid collection. The medial flexor and peroneal tendons are partially obscured by this. 4. Previous resection of the tibial sesamoid of the 1st metatarsal. No acute forefoot findings identified.     Electronically Signed   By: Carey Bullocks M.D.   On: 07/18/2022 16:08  Assessment:   1. Nonunion after arthrodesis   2. Arthritis of left ankle   3. Pes planus, rigid, left   4. Coalition, talocalcaneal   5. Chronic pain in left foot    Plan:  Patient was evaluated and treated and all questions answered.  S/p foot surgery left -He is now 9 months status post resection of coalition and double arthrodesis of the left foot.  He is still having persistent pain.  He has pain of multiple etiologies including bony pain likely from continued nonunion of the talonavicular joint as well as neuritic pain.  Now that he is 9 months postop I would like him to begin noninvasive bone stimulation, referral was placed for this.  I would also like him to have an evaluation for his neuropathic pain and possibility of CRPS being a factor here, he does report some temperature changes and color symptoms although these are not present today in the office he notes them mostly when showering etc.  Long-term if we are below achieve full bony union on the talonavicular joint he is still having pain we will plan to remove the internal hardware and resect the dorsal talonavicular spur.  If he still has persistent pain from this then I think  revision surgery with the addition of a medial calcaneal slide and cotton osteotomy might be indicated.  Would like him to have noninvasive bone stimulation for at least 6 weeks up to 12 weeks.  He should continue using the gabapentin can titrate up to 600 mg twice daily over the next few weeks, new prescription was sent for this.  I will see him back in 5 weeks and we will take new radiographs.  Return in about 5 weeks (around 01/15/2023) for surgery f/u New xrays.

## 2022-12-13 ENCOUNTER — Encounter: Payer: Self-pay | Admitting: Physical Medicine and Rehabilitation

## 2023-01-08 ENCOUNTER — Encounter
Payer: Medicaid Other | Attending: Physical Medicine and Rehabilitation | Admitting: Physical Medicine and Rehabilitation

## 2023-01-08 VITALS — BP 135/76 | HR 59 | Ht 74.0 in | Wt 169.2 lb

## 2023-01-08 DIAGNOSIS — F17290 Nicotine dependence, other tobacco product, uncomplicated: Secondary | ICD-10-CM

## 2023-01-08 DIAGNOSIS — M19072 Primary osteoarthritis, left ankle and foot: Secondary | ICD-10-CM | POA: Diagnosis present

## 2023-01-08 DIAGNOSIS — G5772 Causalgia of left lower limb: Secondary | ICD-10-CM | POA: Insufficient documentation

## 2023-01-08 DIAGNOSIS — M2170 Unequal limb length (acquired), unspecified site: Secondary | ICD-10-CM | POA: Diagnosis present

## 2023-01-08 DIAGNOSIS — R269 Unspecified abnormalities of gait and mobility: Secondary | ICD-10-CM | POA: Diagnosis present

## 2023-01-08 DIAGNOSIS — M5440 Lumbago with sciatica, unspecified side: Secondary | ICD-10-CM | POA: Diagnosis present

## 2023-01-08 DIAGNOSIS — G894 Chronic pain syndrome: Secondary | ICD-10-CM | POA: Insufficient documentation

## 2023-01-08 NOTE — Progress Notes (Signed)
Subjective:    Patient ID: Shawn Harper, male    DOB: May 08, 1996, 27 y.o.   MRN: [id number]  HPI  HPI  Shawn Harper is a 27 y.o. year old male  who  has a past medical history of Anxiety, Depression, Dysrhythmia, Headache, and Smoker.   They are presenting to PM&R clinic as a new patient for pain management evaluation. They were referred by Sharl Ma, DPM for treatment of "Please evaluate for neuropathic pain and CRPS symptoms, recommendations on management, interventions if indicated "  Per their last note, "S/p foot surgery left -He is now 9 months status post resection of coalition and double arthrodesis of the left foot.  He is still having persistent pain.  He has pain of multiple etiologies including bony pain likely from continued nonunion of the talonavicular joint as well as neuritic pain.  Now that he is 9 months postop I would like him to begin noninvasive bone stimulation, referral was placed for this.  I would also like him to have an evaluation for his neuropathic pain and possibility of CRPS being a factor here, he does report some temperature changes and color symptoms although these are not present today in the office he notes them mostly when showering etc.  Long-term if we are below achieve full bony union on the talonavicular joint he is still having pain we will plan to remove the internal hardware and resect the dorsal talonavicular spur.  If he still has persistent pain from this then I think revision surgery with the addition of a medial calcaneal slide and cotton osteotomy might be indicated.  Would like him to have noninvasive bone stimulation for at least 6 weeks up to 12 weeks.  He should continue using the gabapentin can titrate up to 600 mg twice daily over the next few weeks, new prescription was sent for this.  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 Had progressive pain in his left foot during his work shifts until he had to quit his job in 2022. Had L foot double arthrodesis and resection July 2023. Since then, as soon as his post-op nerve block wore off it was severe; it has improved somewhat since then to where he can ambulate but he "hit a wall" in terms of progress.   Description of pain: constant and sharp; Shooting pains and sensitivity into toes which is improving; Pain into calf is shooting and ankle is sore pain . Exacerbating factors:  Walking on a cold floor execerbates, warm baths dont feel much Turns red in the shower, capillary refill is delayed No notable swelling On the toes, fabrics are no longer painful but are somewhat sensitive post-op. His toes had absolutely no sensation post-op, but has started to come back.  Red flag symptoms: No red flags for back pain endorsed in Hx or ROS  Medications tried: Topical medications (no effect) : CBD oil Nsaids (no effect) :  Tylenol  (no effect) :  Opiates  (moderate effect) : Was on hydromorphone 2 mg post-op; does not want to try these at this point.  Gabapentin / Lyrica  (mild effect) : Gabapentin 600 mg BID; notices it more when he does not take it.  TCAs  (never tried) :  SNRIs  (unsure of effect) : Currently on Cymbalta; hasn't had any side effects and it has significantly improved his mood. Hasn't had any benefit for pain that he has noticed.  Other  () : none  Other treatments: PT/OT  ( made symptoms worse ) : Was able to tolerate less than 10 sessions post-op; worsened his symptoms and he was discharged.  Accupuncture/chiropractor/massage  (never tried) :  TENs unit (no effect) :  Injections (no effect) : Cortisone shots in the foot pre- and post-op  Surgery ( worse ) : See above Other  () : Starting bone stimulation trials today DME: Uses shoe  insert orthotics which are new. Uses a lace up brace to support his L ankle. Just got a single point cane.   Goals for pain control: "walk; that's it. I'm never going to run again. Me and my son used to do nature walks, he's 6, I would like to do that again".   Prior UDS results: No results found for: "LABOPIA", "COCAINSCRNUR", "LABBENZ", "AMPHETMU", "THCU", "LABBARB"    Pain Inventory Average Pain 9 Pain Right Now 7 My pain is constant, sharp, burning, dull, stabbing, tingling, and aching  In the last 24 hours, has pain interfered with the following? General activity 10 Relation with others 5 Enjoyment of life 10 What TIME of day is your pain at its worst? morning , daytime, evening, and night Sleep (in general) Poor  Pain is worse with: walking, bending, and standing Pain improves with: medication Relief from Meds: 2  how many minutes can you walk? 1-2 ability to climb steps?  yes do you drive?  yes  not employed: date last employed . I need assistance with the following:  household duties and shopping  numbness tingling depression anxiety  New pt  New pt    Family History  Problem Relation Age of Onset   Hypertension Mother    Hyperlipidemia Mother    Depression Mother        after loss of son   Anxiety disorder Mother        after loss of son   Colon polyps Mother        at 18   Other Father        does not talk to dad   Suicidality Brother        75, states was ruled accident, family think suicide   Obesity Sister    Syncope episode Sister    COPD Maternal Grandmother    Heart disease Maternal Grandfather    Social History   Socioeconomic History   Marital status: Significant Other    Spouse name: Not on file   Number of children: 1   Years of education: Not on file   Highest education level: Not on file  Occupational History   Not on file  Tobacco Use   Smoking status: Former    Packs/day: .25    Types: Cigarettes   Smokeless tobacco:  Never  Vaping Use   Vaping Use: Every day   Substances: Nicotine, Flavoring  Substance and Sexual Activity   Alcohol use: Yes    Alcohol/week: 0.0 - 1.0 standard drinks of alcohol    Comment: occasional beer    Drug use: Yes    Types: Marijuana    Comment: encouraged against use   Sexual activity: Yes    Partners: Female  Other Topics Concern   Not on file  Social History Narrative   Long term relationship since 2017-  live together (not his thing to get married). 36 year old son Reuel Boom in 12/2017.    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    HS graduate      Hobbies: time with girlfriend and son, netflix, working out- really enjoys   -prior Diplomatic Services operational officer   Social Determinants of Corporate investment banker Strain: Not on file  Food Insecurity: Not on file  Transportation Needs: Not on file  Physical Activity: Not on file  Stress: Not on file  Social Connections: Not on file   Past Surgical History:  Procedure Laterality Date   APPLICATION OF WOUND VAC  03/08/2022   Procedure: APPLICATION OF WOUND VAC;  Surgeon: Edwin Cap, DPM;  Location: [facility];  Service: Podiatry;;   FLAT FOOT RECONSTRUCTION-TAL GASTROC RECESSION Left 03/08/2022   Procedure: FLAT FOOT RECONSTRUCTION WITH FUSION OF JOINTS IN BACK OF FOOT; POSSIBLE CALF MUSCLE LENGTHENING;BONEGRAFT INTO FOOT TO BUILD ARCH; BONEGRAFT FROM LEG;  Surgeon: Edwin Cap, DPM;  Location: [facility];  Service: Podiatry;  Laterality: Left;  POPLITEL AND SAPHENOUS BLOCK   LEG SURGERY     within a month of birth, in cast for infancy   Past Medical History:  Diagnosis Date   Anxiety    Depression    Dysrhythmia    Headache    Smoker    5 pack years, trying to quit   BP 135/76   Pulse (!) 59   Ht 6\' 2"  (1.88 m)   Wt 169 lb 3.2 oz (76.7 kg)   SpO2 96%   BMI 21.72 kg/m    Opioid Risk Score:   Fall Risk Score:  `1  Depression screen [facility] 2/9     01/08/2023   11:39 AM 01/03/2022    4:05 PM 06/29/2019    9:28 AM 12/25/2017   11:25 AM  Depression screen [facility] 2/9  Decreased Interest 2 0 0 2  Down, Depressed, Hopeless 2 0 0 1  PHQ - 2 Score 4 0 0 3  Altered sleeping 3 0 1 1  Tired, decreased energy 2 3  3   Change in appetite 2 0 0 2  Feeling bad or failure about yourself  2 0 1 1  Trouble concentrating 2 0 1 1  Moving slowly or fidgety/restless 0 0 0 0  Suicidal thoughts 0 0 0 0  PHQ-9 Score 15 3 3 11   Difficult doing work/chores Extremely dIfficult Not difficult at all Not difficult at all Somewhat difficult     Review of Systems  Musculoskeletal:        Left foot pain  Neurological:  Positive for numbness.  Psychiatric/Behavioral:  Positive for dysphoric mood. The patient is nervous/anxious.   All other systems reviewed and are negative.      Objective:   Physical Exam   PE: Constitution: Appropriate appearance for age. No apparent distress  Resp: No respiratory distress. No accessory muscle usage. on RA and CTAB Cardio: Well perfused appearance. No peripheral edema. Abdomen: Nondistended. Nontender.   Psych: Appropriate mood and affect. Skin: Surgical sites well healed. +Shin, red appearance of L toes vs right; L foot cold to touch.   Neurologic Exam:   Sensory exam: Decreased sensation to light touch L lateal malleoli, 1st toe; otherwise equal bilaterally. LLE distal to ankle very  sensitive to light touch on dorsal foot and toes.  Motor exam: AROM limited L ankle DF, PF, and toes. Strength 4/5 ankle. Coordination: Fine motor coordination was normal.     MSK: + L calf, foot atrophy compared to muscles on R + R hip and knee >1 inch superior to the right  Gait: With single point cane, limits external weightbearing on L/leans over R foot when walking; antalgic.        Assessment & Plan:   GRISHAM FAIRLEY is a 27 y.o. year old  male  who  has a past medical history of Anxiety, Depression, Dysrhythmia, Headache, and Smoker.   They are presenting to PM&R clinic as a new patient for treatment of L foot pain s/p  L foot double arthrodesis and resection July 2023.  Osteoarthritis of left ankle and foot Status post surgery in July 2023, with worsening peripheral nerve pain and ongoing midfoot pain since  Continue following up with podiatry, along with bone regeneration treatments which will be starting today.  Do not feel that three-phase bone scan of foot would be beneficial at this time, given complex surgical interventions and regenerative treatments as above  Complex regional pain syndrome type 2 of left lower extremity  Tentative diagnosis based on immediate postop loss of sensation in the distal toes, followed by apparent pseudomotor changes in temperature sensitivity, coloration, light touch sensitivity, and coldness in the left foot.  Given that these symptoms are improving, this may just represent postop nerve damage which may continue to improve over time.  Continue gabapentin, and increased dose to 2 tablets in the morning, 2 tablets in the afternoon, and 1 tablet at night.  After 1 week, increase to 2 tablets 3 times a day.  Please call the office or message me through MyChart if any side effects or issues with this. Let me kow when you are due for a refill.   Currently on duloxetine 60 mg daily, for mood, doing well  Discussed use of contrast baths in the event of a pain crisis.  Have 2 buckets of water, 1 as hot as tolerable, when is cold is tolerable; alternate the limb in these pockets for 10 minutes at a time to desensitize the foot.  Also recommend trial of over-the-counter lidocaine cream and capsaicin cream to the toes for nerve pain.  For capsaicin cream, if tolerated, leave it on for 20 minutes before wiping off.  Follow-up with me in 6 weeks.  Leg length discrepancy Recently got new shoe inserts  out-of-pocket with a remote company; will send to [facility] for second opinion and to evaluate for heel lifts for leg length discrepancy.  If this is too expensive out-of-pocket, please let us know and we can try to appeal to insurance to get these covered.  If you do get a heel lift, I will want to send you to physical therapy afterwards, so keep me in the loop.  Abnormality of gait and mobility  Continue use of left ankle brace and cane.  As demonstrated in clinic, please use the cane in your right hand and advance it with your left leg to help offload the foot when walking.

## 2023-01-08 NOTE — Patient Instructions (Addendum)
  Osteoarthritis of left ankle and foot Status post surgery in July 2023, with worsening peripheral nerve pain and ongoing midfoot pain since  Continue following up with podiatry, along with bone regeneration treatments which will be starting today.  Do not feel that three-phase bone scan of foot would be beneficial at this time, given complex surgical interventions and regenerative treatments as above  Complex regional pain syndrome type 2 of left lower extremity  Tentative diagnosis based on immediate postop loss of sensation in the distal toes, followed by apparent pseudomotor changes in temperature sensitivity, coloration, light touch sensitivity, and coldness in the left foot.  Given that these symptoms are improving, this may just represent postop nerve damage which may continue to improve over time.  Continue gabapentin, and increased dose to 2 tablets in the morning, 2 tablets in the afternoon, and 1 tablet at night.  After 1 week, increase to 2 tablets 3 times a day.  Please call the office or message me through MyChart if any side effects or issues with this. Let me kow when you are due for a refill.   Currently on duloxetine 60 mg daily, for mood, doing well   I am also getting an xray of your lo back to look for sites of nerve pinching  Discussed use of contrast baths in the event of a pain crisis.  Have 2 buckets of water, 1 as hot as tolerable, when is cold is tolerable; alternate the limb in these pockets for 10 minutes at a time to desensitize the foot.  Also recommend trial of over-the-counter lidocaine cream and capsaicin cream to the toes for nerve pain.  For capsaicin cream, if tolerated, leave it on for 20 minutes before wiping off.  Follow-up with me in 6 weeks.  Leg length discrepancy Recently got new shoe inserts out-of-pocket with a remote company; will send to [facility] for second opinion and to evaluate for heel lifts for leg length discrepancy.  If this is too  expensive out-of-pocket, please let us know and we can try to appeal to insurance to get these covered.  If you do get a heel lift, I will want to send you to physical therapy afterwards, so keep me in the loop.   Abnormality of gait and mobility  Continue use of left ankle brace and cane.  As demonstrated in clinic, please use the cane in your right hand and advance it with your left leg to help offload the foot when walking.

## 2023-01-16 ENCOUNTER — Ambulatory Visit (INDEPENDENT_AMBULATORY_CARE_PROVIDER_SITE_OTHER): Payer: Medicaid Other | Admitting: Podiatry

## 2023-01-16 ENCOUNTER — Ambulatory Visit (INDEPENDENT_AMBULATORY_CARE_PROVIDER_SITE_OTHER): Payer: Medicaid Other

## 2023-01-16 DIAGNOSIS — M960 Pseudarthrosis after fusion or arthrodesis: Secondary | ICD-10-CM

## 2023-01-16 DIAGNOSIS — G8929 Other chronic pain: Secondary | ICD-10-CM

## 2023-01-16 DIAGNOSIS — Q6689 Other  specified congenital deformities of feet: Secondary | ICD-10-CM | POA: Diagnosis not present

## 2023-01-16 DIAGNOSIS — M19072 Primary osteoarthritis, left ankle and foot: Secondary | ICD-10-CM

## 2023-01-16 NOTE — Progress Notes (Signed)
Subjective:  Patient ID: Shawn Harper, male    DOB: 19-May-1996,  MRN: [id number]  Chief Complaint  Patient presents with   Arthritis    5 week follow up, non union after arthrodesis  -  new xrays - DOS 03/08/22 --- LEFT FLATFOOT RECONSTRUCTION WITH FUSION OF JOINTS IN BACK OF FOOT, POSSIBLE CALF MUSCLE LENGHTENING, BONE GRAFTINTO FAT TO BUILD ARCH , BONE GRAFT FROM LEG          27 y.o. male returns for post-op check.  He returns for follow-up pain is still consistent, he has been using the bone stimulator as much as possible up to about 6 or 7 hours a day right now.  Has not been able to effectively use it while sleeping.  He did see pain management, they discussed there is a possibility of CRPS type II versus postoperative neuralgia.  Has increased gabapentin which does not eliminate pain but he does say that when he does not take it it is much more painful  Review of Systems: Negative except as noted in the HPI. Denies N/V/F/Ch.   Objective:   There were no vitals filed for this visit.  There is no height or weight on file to calculate BMI. Constitutional Well developed. Well nourished.  Vascular Foot warm and well perfused. Capillary refill normal to all digits.  Calf is soft and supple, no posterior calf or knee pain, negative Homans' sign  Neurologic Normal speech. Oriented to person, place, and time. Epicritic sensation to light touch grossly present bilaterally with the exception of the lesser toes which feel numb.  Temperature and color symmetric here in the office today.  Dermatologic Incisions are well-healed and not hypertrophic  Orthopedic: No soft tissue swelling.  Tenderness in the plantar forefoot to palpation.  Mild pain over the TN joint   Multiple view plain film radiographs: New films taken today show still persistent lucency at the dorsal talonavicular fusion site, good fusion across subtalar joint, no much change since last films   CT  07/18/22 IMPRESSION: 1. 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 2. No acute osseous findings. 3. Scarring or inflammation medial and lateral to the subtalar joint without focal fluid collection. The medial flexor and peroneal tendons are partially obscured by this. 4. Previous resection of the tibial sesamoid of the 1st metatarsal. No acute forefoot findings identified.     Electronically Signed   By: Carey Bullocks M.D.   On: 07/18/2022 16:08  Assessment:   1. Nonunion after arthrodesis   2. Arthritis of left ankle   3. Coalition, talocalcaneal   4. Chronic pain in left foot    Plan:  Patient was evaluated and treated and all questions answered.  S/p foot surgery left -Not much change in films so far or arthrodesis sites, he has been using the bone stimulator for little over a week now.  He will continue to use this is much as possible.  Goal would be for 10 hours/day.  I discussed with him that we will take new x-rays in 8 weeks and reevaluate his progress.  If no change or improvement by that point we will plan to remove his hardware, backfill the screw sites with bone substitute and remove the spur on the anterior talonavicular joint.  He also does have a slight limb discrepancy of 1 cm, heel lifts were dispensed today we will try 1 or 2 of these  under his orthoses to see if this is helpful. Return in about 8 weeks (around 03/13/2023) for surgery follow up (new x-rays).

## 2023-02-19 ENCOUNTER — Encounter
Payer: Medicaid Other | Attending: Physical Medicine and Rehabilitation | Admitting: Physical Medicine and Rehabilitation

## 2023-02-19 ENCOUNTER — Encounter: Payer: Self-pay | Admitting: Physical Medicine and Rehabilitation

## 2023-02-19 VITALS — BP 123/80 | HR 71 | Ht 74.0 in | Wt 170.0 lb

## 2023-02-19 DIAGNOSIS — M19072 Primary osteoarthritis, left ankle and foot: Secondary | ICD-10-CM | POA: Insufficient documentation

## 2023-02-19 DIAGNOSIS — R269 Unspecified abnormalities of gait and mobility: Secondary | ICD-10-CM | POA: Diagnosis present

## 2023-02-19 DIAGNOSIS — G5772 Causalgia of left lower limb: Secondary | ICD-10-CM | POA: Insufficient documentation

## 2023-02-19 MED ORDER — MELOXICAM 7.5 MG PO TABS: ORAL_TABLET | ORAL | 3 refills | Status: DC

## 2023-02-19 NOTE — Patient Instructions (Signed)
Osteoarthritis of left ankle and foot Follow up with your surgeon for possible bone regeneration infusions and possible surgery for bone spur shaving and pin removal  We discussed risks and benefits of starting low dose narcotic such as Tramadol, which you wish to defer at this time due to addictive personality and concerns about dependence. This is reasonable.  Start Meloxicam 7.5 mg with one tablet in the morning. If no side effects after 1 week, you can increase it to 2 tablets in the morning. Do not take with other over the counter NSAIDs like ibuprofen; you can take this with tylenol.    Follow up in 3 months or sooner if any concerns  Complex regional pain syndrome type 2 of left lower extremity Continue gabapentin; if daytime sedation too much, can decrease daytime doses and keep larger dose at nighttime  Will defer transition to lyrica at this time per patient preference  Also recommend trial of over-the-counter lidocaine cream and capsaicin cream to the toes for nerve pain.  For capsaicin cream, if tolerated, leave it on for 20 minutes before wiping off.  Abnormality of gait and mobility  Using shoe insert for now, patient has not followed up with [facility] due to no perceived benefit; continue insert management with your surgeon.   Continue an active lifestyle as tolerated

## 2023-02-19 NOTE — Progress Notes (Signed)
Subjective:    Patient ID: Shawn Harper, male    DOB: 1996/04/08, 27 y.o.   MRN: [id number]  HPI  Shawn Harper is a 27 y.o. year old male  who  has a past medical history of Anxiety, Depression, Dysrhythmia, Headache, and Smoker.   They are presenting to PM&R clinic for follow up related to  L foot pain s/p  L foot double arthrodesis and resection July 2023 .  Plan from last visit:  Osteoarthritis of left ankle and foot Status post surgery in July 2023, with worsening peripheral nerve pain and ongoing midfoot pain since   Continue following up with podiatry, along with bone regeneration treatments which will be starting today.   Do not feel that three-phase bone scan of foot would be beneficial at this time, given complex surgical interventions and regenerative treatments as above   Complex regional pain syndrome type 2 of left lower extremity   Tentative diagnosis based on immediate postop loss of sensation in the distal toes, followed by apparent pseudomotor changes in temperature sensitivity, coloration, light touch sensitivity, and coldness in the left foot.  Given that these symptoms are improving, this may just represent postop nerve damage which may continue to improve over time.   Continue gabapentin, and increased dose to 2 tablets in the morning, 2 tablets in the afternoon, and 1 tablet at night.  After 1 week, increase to 2 tablets 3 times a day.  Please call the office or message me through MyChart if any side effects or issues with this. Let me kow when you are due for a refill.    Currently on duloxetine 60 mg daily, for mood, doing well   Discussed use of contrast baths in the event of a pain crisis.  Have 2 buckets of water, 1 as hot as tolerable, when is cold is tolerable; alternate the limb in these pockets for 10 minutes at a time to desensitize the foot.   Also recommend trial of over-the-counter lidocaine cream and capsaicin cream to the toes for nerve pain.  For  capsaicin cream, if tolerated, leave it on for 20 minutes before wiping off.   Follow-up with me in 6 weeks.   Leg length discrepancy Recently got new shoe inserts out-of-pocket with a remote company; will send to [facility] for second opinion and to evaluate for heel lifts for leg length discrepancy.  If this is too expensive out-of-pocket, please let us know and we can try to appeal to insurance to get these covered.   If you do get a heel lift, I will want to send you to physical therapy afterwards, so keep me in the loop.   Abnormality of gait and mobility   Continue use of left ankle brace and cane.  As demonstrated in clinic, please use the cane in your right hand and advance it with your left leg to help offload the foot when walking.   Interval Hx:  - Therapies:  none since last visit   - Follow ups: Hasn't started bone regeneration treatments yet. May be getting scheduled for bone spurr shaving and getting screws in the foot removed to try and help with pain.    - Falls: none; son occassionally kicks his dad's foot but otherewise no issiues.    - DME: Dr. Lilian Kapur has him trying a heel lift insert, hsa not noticed any real benefit.    - Medications: Gabapentin causes lethargy, sometimes even falls aseep at home. But does help  with pain; "if I miss a dose I will feel my toes start pinging a bit."   States he has an addictive personality and "I would rather be in pain than on an opiate". Discussed tramadol as a possibility. No Hx heart disease, GERD, PUD, or hematochezia; does have intemittent constipation.   Cymbalta increased 1 week ago to BID; no effect.   - Other concerns: "To be completely honest with you, I dont care about the nerve pain. Every time I move or step on the ground, theres a spon inside my foot that hurts significantly. The bone is the main issue".  Pain Inventory Average Pain 9 Pain Right Now 6 My pain is constant, sharp, dull, stabbing, tingling, and  aching  In the last 24 hours, has pain interfered with the following? General activity 10 Relation with others 10 Enjoyment of life 10 What TIME of day is your pain at its worst? morning , daytime, evening, night, and varies Sleep (in general) Poor  Pain is worse with: walking, bending, and standing Pain improves with: medication Relief from Meds: 4  Family History  Problem Relation Age of Onset   Hypertension Mother    Hyperlipidemia Mother    Depression Mother        after loss of son   Anxiety disorder Mother        after loss of son   Colon polyps Mother        at 32   Other Father        does not talk to dad   Suicidality Brother        63, states was ruled accident, family think suicide   Obesity Sister    Syncope episode Sister    COPD Maternal Grandmother    Heart disease Maternal Grandfather    Social History   Socioeconomic History   Marital status: Significant Other    Spouse name: Not on file   Number of children: 1   Years of education: Not on file   Highest education level: Not on file  Occupational History   Not on file  Tobacco Use   Smoking status: Former    Packs/day: .25    Types: Cigarettes   Smokeless tobacco: Never  Vaping Use   Vaping Use: Every day   Substances: Nicotine, Flavoring  Substance and Sexual Activity   Alcohol use: Yes    Alcohol/week: 0.0 - 1.0 standard drinks of alcohol    Comment: occasional beer    Drug use: Yes    Types: Marijuana    Comment: encouraged against use   Sexual activity: Yes    Partners: Female  Other Topics Concern   Not on file  Social History Narrative   Long term relationship since 2017- live together (not his thing to get married). 66 year old son Shawn Harper in 12/2017.    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    HS graduate      Hobbies: time with  girlfriend and son, netflix, working out- really enjoys   -prior Diplomatic Services operational officer   Social Determinants of Corporate investment banker Strain: Not on file  Food Insecurity: Not on file  Transportation Needs: Not on file  Physical Activity: Not on file  Stress: Not on file  Social Connections: Not on  file   Past Surgical History:  Procedure Laterality Date   APPLICATION OF WOUND VAC  03/08/2022   Procedure: APPLICATION OF WOUND VAC;  Surgeon: Edwin Cap, DPM;  Location: [facility];  Service: Podiatry;;   FLAT FOOT RECONSTRUCTION-TAL GASTROC RECESSION Left 03/08/2022   Procedure: FLAT FOOT RECONSTRUCTION WITH FUSION OF JOINTS IN BACK OF FOOT; POSSIBLE CALF MUSCLE LENGTHENING;BONEGRAFT INTO FOOT TO BUILD ARCH; BONEGRAFT FROM LEG;  Surgeon: Edwin Cap, DPM;  Location: [facility];  Service: Podiatry;  Laterality: Left;  POPLITEL AND SAPHENOUS BLOCK   LEG SURGERY     within a month of birth, in cast for infancy   Past Surgical History:  Procedure Laterality Date   APPLICATION OF WOUND VAC  03/08/2022   Procedure: APPLICATION OF WOUND VAC;  Surgeon: Edwin Cap, DPM;  Location: [facility];  Service: Podiatry;;   FLAT FOOT RECONSTRUCTION-TAL GASTROC RECESSION Left 03/08/2022   Procedure: FLAT FOOT RECONSTRUCTION WITH FUSION OF JOINTS IN BACK OF FOOT; POSSIBLE CALF MUSCLE LENGTHENING;BONEGRAFT INTO FOOT TO BUILD ARCH; BONEGRAFT FROM LEG;  Surgeon: Edwin Cap, DPM;  Location: [facility];  Service: Podiatry;  Laterality: Left;  POPLITEL AND SAPHENOUS BLOCK   LEG SURGERY     within a month of birth, in cast for infancy   Past Medical History:  Diagnosis Date   Anxiety    Depression    Dysrhythmia    Headache    Smoker    5 pack years, trying to quit   Ht 6\' 2"  (1.88 m)   BMI 21.72 kg/m   Opioid Risk Score:   Fall Risk Score:  `1  Depression screen [facility] 2/9     02/19/2023    9:35 AM 01/08/2023   11:39 AM 01/03/2022    4:05 PM 06/29/2019    9:28 AM 12/25/2017   11:25 AM   Depression screen PHQ 2/9  Decreased Interest 1 2 0 0 2  Down, Depressed, Hopeless 1 2 0 0 1  PHQ - 2 Score 2 4 0 0 3  Altered sleeping  3 0 1 1  Tired, decreased energy  2 3  3   Change in appetite  2 0 0 2  Feeling bad or failure about yourself   2 0 1 1  Trouble concentrating  2 0 1 1  Moving slowly or fidgety/restless  0 0 0 0  Suicidal thoughts  0 0 0 0  PHQ-9 Score  15 3 3 11   Difficult doing work/chores  Extremely dIfficult Not difficult at all Not difficult at all Somewhat difficult     Review of Systems  Musculoskeletal:        LT leg ankle foot pain  All other systems reviewed and are negative.      Objective:   Physical Exam   PE: Constitution: Appropriate appearance for age. No apparent distress   Resp: No respiratory distress. No accessory muscle usage. on RA Cardio: Well perfused appearance. No peripheral edema. Abdomen: Nondistended. Nontender.   Psych: Appropriate mood and affect. Neuro: AAOx4. No apparent cognitive deficits   Neurologic Exam:   Sensory exam:  + Decreased sensation to light touch L lateral malleoli, 1st toe;  +LLE distal to ankle very sensitive to light touch on dorsal foot and toes.  Motor exam: AROM limited L ankle 0/5 DF, 1/5 PF, and 1/5 toes.  Coordination: Fine motor coordination was normal.     MSK: + Pain with bearing weight or ROM through mid-ankle + L calf, foot  atrophy compared to muscles on R + R hip and knee >1 inch superior to the right   Gait: + antalgic, avoids bearing weight through R foot; + wrapping brace         Assessment & Plan:   Shawn Harper is a 27 y.o. year old male  who  has a past medical history of Anxiety, Depression, Dysrhythmia, Headache, and Smoker.   They are presenting to PM&R clinic for follow up related to  L foot pain s/p  L foot double arthrodesis and resection July 2023; suspected CRPS however this is less bothersome then localized joint pain.   Osteoarthritis of left ankle and  foot Follow up with your surgeon for possible bone regeneration infusions and possible surgery for bone spur shaving and pin removal  We discussed risks and benefits of starting low dose narcotic such as Tramadol, which you wish to defer at this time due to addictive personality and concerns about dependence. This is reasonable.  Start Meloxicam 7.5 mg with one tablet in the morning. If no side effects after 1 week, you can increase it to 2 tablets in the morning. Do not take with other over the counter NSAIDs like ibuprofen; you can take this with tylenol.   Follow up in 3 months or sooner if any concerns  Complex regional pain syndrome type 2 of left lower extremity Continue gabapentin; if daytime sedation too much, can decrease daytime doses and keep larger dose at nighttime  Will defer transition to lyrica at this time per patient preference  Also recommend trial of over-the-counter lidocaine cream and capsaicin cream to the toes for nerve pain.  For capsaicin cream, if tolerated, leave it on for 20 minutes before wiping off.  Discussed possible referral for peripheral nerve stimulator; given pain is mostly localized in the midfoot, can try surgical intervention for pin removal first. If ineffective, we can discuss referral next visit or patient can message me sooner for referral.   Abnormality of gait and mobility  Using shoe insert for now, patient has not followed up with [facility] due to no perceived benefit; continue insert management with your surgeon.   Continue an active lifestyle as tolerated  Other orders -     Meloxicam; Start with one tablet in the morning. If no side effects after 1 week, you can increase it to 2 tablets in the morning. Do not take with other over the counter NSAIDs like ibuprofen; you can take this with tylenol.  Dispense: 60 tablet; Refill: 3

## 2023-02-27 ENCOUNTER — Encounter: Payer: Self-pay | Admitting: Family Medicine

## 2023-02-27 ENCOUNTER — Ambulatory Visit (INDEPENDENT_AMBULATORY_CARE_PROVIDER_SITE_OTHER): Payer: Medicaid Other | Admitting: Family Medicine

## 2023-02-27 VITALS — BP 108/80 | HR 54 | Temp 98.6°F | Ht 74.0 in | Wt 168.8 lb

## 2023-02-27 DIAGNOSIS — Z83719 Family history of colon polyps, unspecified: Secondary | ICD-10-CM | POA: Diagnosis not present

## 2023-02-27 DIAGNOSIS — Z0000 Encounter for general adult medical examination without abnormal findings: Secondary | ICD-10-CM

## 2023-02-27 DIAGNOSIS — K625 Hemorrhage of anus and rectum: Secondary | ICD-10-CM | POA: Diagnosis not present

## 2023-02-27 NOTE — Progress Notes (Signed)
Phone: [phone]    Subjective:  Patient presents today for their annual physical. Chief complaint-noted.   See problem oriented charting- ROS- full  review of systems was completed and negative  except for: activity change- difficulty walking with pain, abdominal pain, anal bleeding, constipatino, diarrhea, rectal pain, difficulty urinating - doesn't feel like always emties full, anxiety/stress/sleep issues  The following were reviewed and entered/updated in epic: Past Medical History:  Diagnosis Date   Anxiety    Depression    Dysrhythmia    Headache    Smoker    5 pack years, trying to quit   Patient Active Problem List   Diagnosis Date Noted   GAD (generalized anxiety disorder) 10/07/2014    Priority: Medium    Depression 10/07/2014    Priority: Medium    Unprotected sex 10/07/2014    Priority: Low   Former smoker     Priority: Low   Chronic pain syndrome 01/08/2023   Complex regional pain syndrome type 2 of left lower extremity 01/08/2023   Leg length discrepancy 01/08/2023   Abnormality of gait and mobility 01/08/2023   Coalition, talocalcaneal    Osteoarthritis of left ankle and foot    Smoker 01/28/2021   Past Surgical History:  Procedure Laterality Date   APPLICATION OF WOUND VAC  03/08/2022   Procedure: APPLICATION OF WOUND VAC;  Surgeon: Edwin Cap, DPM;  Location: [facility];  Service: Podiatry;;   FLAT FOOT RECONSTRUCTION-TAL GASTROC RECESSION Left 03/08/2022   Procedure: FLAT FOOT RECONSTRUCTION WITH FUSION OF JOINTS IN BACK OF FOOT; POSSIBLE CALF MUSCLE LENGTHENING;BONEGRAFT INTO FOOT TO BUILD ARCH; BONEGRAFT FROM LEG;  Surgeon: Edwin Cap, DPM;  Location: [facility];  Service: Podiatry;  Laterality: Left;  POPLITEL AND SAPHENOUS BLOCK   LEG SURGERY     within a month of birth, in cast for infancy    Family History  Problem Relation Age of Onset   Hypertension Mother    Hyperlipidemia Mother    Depression Mother        after loss of son    Anxiety disorder Mother        after loss of son   Colon polyps Mother        at 44   Other Father        does not talk to dad   Suicidality Brother        64, states was ruled accident, family think suicide   Obesity Sister    Syncope episode Sister    COPD Maternal Grandmother    Heart disease Maternal Grandfather     Medications- reviewed and updated Current Outpatient Medications  Medication Sig Dispense Refill   CYANOCOBALAMIN PO Take 1 tablet by mouth daily.     DULoxetine (CYMBALTA) 60 MG capsule Take 60 mg by mouth 2 (two) times daily.     gabapentin (NEURONTIN) 300 MG capsule Take 2 capsules (600 mg total) by mouth 2 (two) times daily. (Patient taking differently: Take 600 mg by mouth 3 (three) times daily.) 360 capsule 0   meloxicam (MOBIC) 7.5 MG tablet Start with one tablet in the morning. If no side effects after 1 week, you can increase it to 2 tablets in the morning. Do not take with other over the counter NSAIDs like ibuprofen; you can take this with tylenol. 60 tablet 3   VITAMIN D PO Take 1 tablet by mouth daily.     No current facility-administered medications for this visit.    Allergies-reviewed  and updated Allergies  Allergen Reactions   Doxycycline Nausea And Vomiting    Social History   Social History Narrative   Engaged July 2024-Long term relationship since [id number]. 29 year old son Reuel Boom in 12/2017.    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    HS graduate      Hobbies: time with girlfriend and son, netflix, working out- really enjoys   -prior photography      Objective:  BP 108/80   Pulse (!) 54   Temp 98.6 F (37 C)   Ht 6\' 2"  (1.88 m)   Wt 168 lb 12.8 oz (76.6  kg)   SpO2 100%   BMI 21.67 kg/m  Gen: NAD, resting comfortably HEENT: Mucous membranes are moist. Oropharynx normal Neck: no thyromegaly CV: RRR no murmurs rubs or gallops Lungs: CTAB no crackles, wheeze, rhonchi Abdomen: soft/nontender/nondistended/normal bowel sounds. No rebound or guarding.  Ext: no edema- significant loss of muscle mass in left calf noted- also into his left thigh loss of muscle mass Skin: warm, dry Neuro: grossly normal other than left leg weakness- walks with cane, moves all extremities, PERRLA     Assessment and Plan:  27 y.o. male presenting for annual physical.  Health Maintenance counseling: 1. Anticipatory guidance: Patient counseled regarding regular dental exams - advised q6 months, eye exams - needs to schedule follow up,  avoiding smoking and second hand smoke , limiting alcohol to 2 beverages per day- rare social like trips mainly, no illicit drugs- not smoking - just does CBD through pens he buys at store.- helps with pain 2. Risk factor reduction:  Advised patient of need for regular exercise and diet rich and fruits and vegetables to reduce risk of heart attack and stroke.  Exercise- trying to do upper body with leg limitations- does all seated exercise- quad machine hurts when pushing off.  Diet/weight management-still trying to eat reasonable healthy in general but does struggle some with caring for young family Wt Readings from Last 3 Encounters:  02/27/23 168 lb 12.8 oz (76.6 kg)  02/19/23 170 lb (77.1 kg)  01/08/23 169 lb 3.2 oz (76.7 kg)  3. Immunizations/screenings/ancillary studies- declines COVID and fall flu shot -otherwise up to date  Immunization History  Administered Date(s) Administered   Influenza,inj,Quad PF,6+ Mos 06/29/2019   PFIZER(Purple Top)SARS-COV-2 Vaccination 07/01/2020, 07/22/2020   Tdap 12/25/2017  4. Prostate cancer screening-  no family history, start at age 1   5. Colon cancer screening - see below 6. Skin cancer  screening/prevention- no dermatologist. advised regular sunscreen use. Denies worrisome, changing, or new skin lesions.  7. Testicular cancer screening- advised monthly self exams  8. STD screening- patient opts out- only active with fiancee 9. Smoking associated screening- former smoker- 2 packs a day maximum but quit 7-8 years ago- typically get urine on him but just peed so will hold until next year  Status of chronic or acute concerns   #osteoarthritis of left ankle and foot- working with physical medicine and rehabilitation recently. Concern for complex regional pain syndrome as well. Meloxicam has been somewhat helpful but still dealing with significant pain and using  cane to walk.  -on Cymbalta for pain and also helps with depressed mood- he was dealing with this and didn't even realize it- thankfully now fiancee encouraged him to get care for this -trying to watch his 29 year old but can be challenging -working with podiatry still- may get screws removed -reports bone stimulator didn't help much- up to 6.5 hours a day on average -heel lifts may help very slightly  #Constipation/BRIGHT RED BLOOD PER RECTUM - patient states about a month ago  went to the beach (usually doesn't drink but had a few drinks and had some constipation as not used to drinking- ended up straining to go)and had later abdominal pain and noted blood in stool. We had considered early colonoscopy at age 42-40 with moms history of large precancerous polyp at age 9 - but we discussed would go ahead in refer in light of above symptoms (though could be due to hemorrhoids) . No abdominal cramping outside of that and felt like it was from straining -also check CBC  -of note does donate plasma  #history of depression and anxiety- lost brother in past which contributed- restarteed on medications recently- working with [facility] to try to improve this- feels like going in right direction but working with them to maximize     02/27/2023   10:56 AM 02/19/2023    9:35 AM 01/08/2023   11:39 AM  Depression screen PHQ 2/9  Decreased Interest 1 1 2   Down, Depressed, Hopeless 1 1 2   PHQ - 2 Score 2 2 4   Altered sleeping 2  3  Tired, decreased energy 3  2  Change in appetite 3  2  Feeling bad or failure about yourself  0  2  Trouble concentrating 3  2  Moving slowly or fidgety/restless 0  0  Suicidal thoughts 0  0  PHQ-9 Score 13  15  Difficult doing work/chores Somewhat difficult  Extremely dIfficult   #opts out of labs today- reassuring lasb about a year ago- we will order again next year- mildly high cholesterol but not in range for medicine  Recommended follow up: Return in about 1 year (around 02/27/2024) for physical or sooner if needed.Schedule b4 you leave. Future Appointments  Date Time Provider Department Center  03/13/2023  8:45 AM Edwin Cap, DPM [facility]  05/21/2023  9:40 AM Angelina Sheriff, DO [facility]   Lab/Order associations: fasting   ICD-10-CM   1. Preventative health care  Z00.00     2. Bright red blood per rectum  K62.5 Ambulatory referral to Gastroenterology    3. Family history of colonic polyps  Z83.719 Ambulatory referral to Gastroenterology      No orders of the defined types were placed in this encounter.   Return precautions advised.   Tana Conch, MD

## 2023-02-27 NOTE — Patient Instructions (Addendum)
Let us know if you get any COVID vaccines this fall. Schedule dentist follow up and eye doctor visitj  [facility] contact Please call to schedule visit and/or procedure Address: [address] [place], [place], [place] [address] Phone: [phone]   Labs next year  Recommended follow up: Return in about 1 year (around 02/27/2024) for physical or sooner if needed.Schedule b4 you leave.

## 2023-03-09 ENCOUNTER — Other Ambulatory Visit: Payer: Self-pay | Admitting: Podiatry

## 2023-03-10 ENCOUNTER — Telehealth: Payer: Self-pay | Admitting: *Deleted

## 2023-03-10 ENCOUNTER — Other Ambulatory Visit: Payer: Self-pay | Admitting: *Deleted

## 2023-03-10 MED ORDER — GABAPENTIN 300 MG PO CAPS: 600.0000 mg | ORAL_CAPSULE | Freq: Two times a day (BID) | ORAL | 0 refills | Status: DC

## 2023-03-10 NOTE — Telephone Encounter (Signed)
Mr Shawn Harper called for a refill on his gabapentin. It looks like it was last prescribed by Dr Lilian Kapur who referred Mr Shawn Harper to Dr Shearon Stalls. Dr Shearon Stalls is out of the office but she did say in her note to continue the gabapentin. He has an appt with Dr Lilian Kapur on Thursday but he is completely out of medication now, so based on her note I have told him I will send in a 30 day supply and she can further refill when she returns.

## 2023-03-13 ENCOUNTER — Ambulatory Visit (INDEPENDENT_AMBULATORY_CARE_PROVIDER_SITE_OTHER): Payer: Medicaid Other

## 2023-03-13 ENCOUNTER — Ambulatory Visit (INDEPENDENT_AMBULATORY_CARE_PROVIDER_SITE_OTHER): Payer: Medicaid Other | Admitting: Podiatry

## 2023-03-13 ENCOUNTER — Encounter: Payer: Self-pay | Admitting: Podiatry

## 2023-03-13 DIAGNOSIS — Z9889 Other specified postprocedural states: Secondary | ICD-10-CM | POA: Diagnosis not present

## 2023-03-13 DIAGNOSIS — M7752 Other enthesopathy of left foot: Secondary | ICD-10-CM | POA: Diagnosis not present

## 2023-03-13 DIAGNOSIS — T8484XA Pain due to internal orthopedic prosthetic devices, implants and grafts, initial encounter: Secondary | ICD-10-CM

## 2023-03-13 DIAGNOSIS — M19072 Primary osteoarthritis, left ankle and foot: Secondary | ICD-10-CM

## 2023-03-13 NOTE — Progress Notes (Signed)
Subjective:  Patient ID: Shawn Harper, male    DOB: [birth year],  MRN: [id number]  Chief Complaint  Patient presents with   Routine Post Op    "It's painful, always hurts."     27 y.o. male returns for post-op check.  He returns for follow-up pain largely is unchanged and he is in pain every day and still quite limiting.  Using the bone stimulator.  Review of Systems: Negative except as noted in the HPI. Denies N/V/F/Ch.   Objective:   There were no vitals filed for this visit.  There is no height or weight on file to calculate BMI. Constitutional Well developed. Well nourished.  Vascular Foot warm and well perfused. Capillary refill normal to all digits.  Calf is soft and supple, no posterior calf or knee pain, negative Homans' sign  Neurologic Normal speech. Oriented to person, place, and time. Epicritic sensation to light touch grossly present bilaterally with the exception of the lesser toes which feel numb.  Temperature and color symmetric here in the office today.  Dermatologic Incisions are well-healed and not hypertrophic  Orthopedic: No soft tissue swelling.  Tenderness in the plantar forefoot to palpation.  Mild pain over the TN joint.  Limited range of motion of the ankle joint   Multiple view plain film radiographs: New films taken today show slight increase in consult ration across the talonavicular joint   CT 07/18/22 IMPRESSION: 1. 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 2. No acute osseous findings. 3. Scarring or inflammation medial and lateral to the subtalar joint without focal fluid collection. The medial flexor and peroneal tendons are partially obscured by this. 4. Previous resection of the tibial sesamoid of the 1st metatarsal. No acute forefoot findings identified.     Electronically Signed   By: Carey Bullocks M.D.   On: 07/18/2022  16:08  Assessment:   1. Pain due to internal orthopedic prosthetic devices, implants and grafts, initial encounter (HCC)   2. Arthritis of left ankle   3. Bone spur of left foot    Plan:  Patient was evaluated and treated and all questions answered.  S/p foot surgery left -Continues to give him quite a bit of chronic pain.  I think at this point considering he has had minimal improvement with bone marrow stimulation despite increasing consolidation across the fusion site, a source of this pain is likely impingement of the internal hardware on surrounding bony and soft tissue structures.  Additionally the bony spur on the talar neck is limiting his range of motion of the ankle and decreasing his function and ambulation ability.  We discussed further treatment including surgical treatment.  I recommend we proceed with the removal of the implants, backfilling of the screw tracts with bone void filler such as DBM or calcium sulfate, and removal of the bony exostosis.  We discussed this could be a staged procedure that we will allow this to heal and consolidate and long-term may need to return for midfoot osteotomy and calcaneal medial displacement osteotomy.  We discussed the risk benefits and recovery process.  He will be weightbearing as tolerated in the boot after surgery.  We discussed the risk of infection and further development of scar tissue and nerve entrapment.  Informed consent signed reviewed.  All questions addressed.   Surgical plan:  Procedure: -Removal of hardware, filling of bone void, excision of talar exostosis  Location: -[facility]  Anesthesia plan: -IV sedation with a regional block  Postoperative pain plan: - Tylenol 1000 mg every 6 hours, ibuprofen 600 mg every 6 hours, gabapentin 300 mg every 8 hours x5 days, oxycodone 5 mg 1-2 tabs every 6 hours only as needed  DVT prophylaxis: -None required  WB Restrictions / DME needs: -WBAT in CAM boot after which she will bring  to surgery.   No follow-ups on file.

## 2023-03-20 ENCOUNTER — Telehealth: Payer: Self-pay | Admitting: Urology

## 2023-03-20 NOTE — Telephone Encounter (Signed)
DOS - 03/28/23  EXCISION OF SPUR LEFT --- 28120 REMOVAL HARDWARE LEFT --- 20680  [facility]  PER [facility] FOR CPT CODES 29528 AND [address] [facility] APPROVED, AUTH # [id number] AND  [id number], GOOD FROM 03/28/23 - 06/26/23.

## 2023-03-28 ENCOUNTER — Other Ambulatory Visit: Payer: Self-pay | Admitting: Podiatry

## 2023-03-28 DIAGNOSIS — M216X2 Other acquired deformities of left foot: Secondary | ICD-10-CM

## 2023-03-28 DIAGNOSIS — M2012 Hallux valgus (acquired), left foot: Secondary | ICD-10-CM

## 2023-03-28 MED ORDER — OXYCODONE HCL 5 MG PO TABS: 5.0000 mg | ORAL_TABLET | Freq: Four times a day (QID) | ORAL | 0 refills | Status: AC | PRN

## 2023-03-28 MED ORDER — IBUPROFEN 800 MG PO TABS: 800.0000 mg | ORAL_TABLET | Freq: Three times a day (TID) | ORAL | 0 refills | Status: AC | PRN

## 2023-04-01 ENCOUNTER — Ambulatory Visit (INDEPENDENT_AMBULATORY_CARE_PROVIDER_SITE_OTHER): Payer: Medicaid Other

## 2023-04-01 ENCOUNTER — Ambulatory Visit (INDEPENDENT_AMBULATORY_CARE_PROVIDER_SITE_OTHER): Payer: Medicaid Other | Admitting: Podiatry

## 2023-04-01 ENCOUNTER — Ambulatory Visit: Payer: Medicaid Other

## 2023-04-01 DIAGNOSIS — M7752 Other enthesopathy of left foot: Secondary | ICD-10-CM

## 2023-04-01 DIAGNOSIS — T8484XA Pain due to internal orthopedic prosthetic devices, implants and grafts, initial encounter: Secondary | ICD-10-CM

## 2023-04-01 DIAGNOSIS — Z09 Encounter for follow-up examination after completed treatment for conditions other than malignant neoplasm: Secondary | ICD-10-CM

## 2023-04-01 NOTE — Progress Notes (Signed)
  Subjective:  Patient ID: Shawn Harper, male    DOB: Nov 09, 1995,  MRN: [id number]  Chief Complaint  Patient presents with   Routine Post Op    POV #1 DOS 03/28/2023 --- SPUR REMOVAL FRONT OF LEFT ANKLE AND REMOVAL OF ALL SCREWS     27 y.o. male returns for post-op check.  Overall doing okay  Review of Systems: Negative except as noted in the HPI. Denies N/V/F/Ch.   Objective:  There were no vitals filed for this visit. There is no height or weight on file to calculate BMI. Constitutional Well developed. Well nourished.  Vascular Foot warm and well perfused. Capillary refill normal to all digits.  Calf is soft and supple, no posterior calf or knee pain, negative Homans' sign  Neurologic Normal speech. Oriented to person, place, and time. Epicritic sensation to light touch grossly present bilaterally.  Dermatologic Skin healing well without signs of infection. Skin edges well coapted without signs of infection.  Orthopedic: Tenderness to palpation noted about the surgical site.  Minimal edema   Multiple view plain film radiographs: Interval removal of spurring on dorsal talar neck, removal of all hardware Assessment:   1. Pain due to internal orthopedic prosthetic devices, implants and grafts, initial encounter (HCC)   2. Bone spur of left foot    Plan:  Patient was evaluated and treated and all questions answered.  S/p foot surgery left -Progressing as expected post-operatively. -XR: Noted above no complication -WB Status: WBAT in CAM boot  -Sutures: Return in 2 weeks to remove.  I will be out of town and they will see Dr. Charlsie Merles for that visit for suture removal.  May transition back to regular shoe gear after that visit as tolerated and follow-up with me 1 month after that visit.  Return in about 2 weeks (around 04/15/2023) for suture removal.

## 2023-04-03 ENCOUNTER — Encounter: Payer: Medicaid Other | Admitting: Podiatry

## 2023-04-17 ENCOUNTER — Encounter: Payer: Self-pay | Admitting: Podiatry

## 2023-04-17 ENCOUNTER — Ambulatory Visit (INDEPENDENT_AMBULATORY_CARE_PROVIDER_SITE_OTHER): Payer: Medicaid Other

## 2023-04-17 ENCOUNTER — Encounter: Payer: Medicaid Other | Admitting: Podiatry

## 2023-04-17 ENCOUNTER — Ambulatory Visit (INDEPENDENT_AMBULATORY_CARE_PROVIDER_SITE_OTHER): Payer: Medicaid Other | Admitting: Podiatry

## 2023-04-17 DIAGNOSIS — Z09 Encounter for follow-up examination after completed treatment for conditions other than malignant neoplasm: Secondary | ICD-10-CM

## 2023-04-17 MED ORDER — CEPHALEXIN 500 MG PO CAPS: 500.0000 mg | ORAL_CAPSULE | Freq: Four times a day (QID) | ORAL | 1 refills | Status: DC

## 2023-04-17 NOTE — Progress Notes (Signed)
Subjective:   Patient ID: Shawn Harper, male   DOB: 27 y.o.   MRN: [id number]   HPI Patient states he has bumped his foot several times and there is been some mild localized redness and he wants the stitches removed today   ROS      Objective:  Physical Exam  Neurovascular status intact negative Denna Haggard' sign noted wound edges are coapted well stitches are intact medial lateral side of foot and heel with mild redness around the medial incision site localized no drainage noted no proximal edema erythema drainage no systemic signs of infection      Assessment:  Patient overall doing well does have some slight localized redness most likely due to trauma but cannot rule out any kind of localized cellulitic event     Plan:  H&P reviewed x-rays and remove stitches.  No drainage was noted no signs of infection but as precautionary measure due to the fact there is some slight redness I did place on doxycycline and gave strict instructions of any changes were to occur to let us know immediately.  Patient to be seen back 2 weeks for reevaluation or earlier if needed  X-rays indicate satisfactory position no indications of pathology from fixation removal

## 2023-04-20 ENCOUNTER — Other Ambulatory Visit: Payer: Self-pay | Admitting: Physical Medicine and Rehabilitation

## 2023-05-01 ENCOUNTER — Encounter: Payer: Self-pay | Admitting: Podiatry

## 2023-05-01 ENCOUNTER — Ambulatory Visit (INDEPENDENT_AMBULATORY_CARE_PROVIDER_SITE_OTHER): Payer: Medicaid Other | Admitting: Podiatry

## 2023-05-01 VITALS — BP 120/84 | HR 64 | Temp 97.5°F | Resp 16 | Ht 74.0 in | Wt 175.0 lb

## 2023-05-01 DIAGNOSIS — T8484XA Pain due to internal orthopedic prosthetic devices, implants and grafts, initial encounter: Secondary | ICD-10-CM

## 2023-05-01 DIAGNOSIS — M7752 Other enthesopathy of left foot: Secondary | ICD-10-CM

## 2023-05-01 NOTE — Progress Notes (Signed)
Subjective:  Patient ID: Shawn Harper, male    DOB: 17-Apr-1996,  MRN: [id number]  Chief Complaint  Patient presents with   Routine Post Op    Wound check F/U...ankylosis across the posterior subtalar joint and the medial aspect of the talonavicular joint status post arthrodesis. Feeling better still slightly red    DOS: 03/28/2023 Procedure: Removal of hardware, excision of talar exostosis  27 y.o. male returns for post-op check.  Starting to improve he has completed the antibiotics  Review of Systems: Negative except as noted in the HPI. Denies N/V/F/Ch.   Objective:   Vitals:   05/01/23 0819  BP: 120/84  Pulse: 64  Resp: 16  Temp: (!) 97.5 F (36.4 C)  SpO2: 98%   Body mass index is 22.47 kg/m. Constitutional Well developed. Well nourished.  Vascular Foot warm and well perfused. Capillary refill normal to all digits.  Calf is soft and supple, no posterior calf or knee pain, negative Homans' sign  Neurologic Normal speech. Oriented to person, place, and time. Epicritic sensation to light touch grossly present bilaterally.  Dermatologic Incisions are well-healed and not hypertrophic, erythema has resolved  Orthopedic: Tenderness to palpation noted about the surgical site.  Most of the pain is anterior to the talar incision dorsally   Assessment:   1. Pain due to internal orthopedic prosthetic devices, implants and grafts, initial encounter (HCC)   2. Bone spur of left foot    Plan:  Patient was evaluated and treated and all questions answered.  S/p foot surgery left -Progressing as expected post-operatively. -He has had some improvement since the hardware removal and ostectomy, still having pain in the midfoot and into the toes but his neuritic pain is gone.  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  Pain previously at a 10 out of 10 now is about a 5 out of 10 -Would like to restart physical therapy  for ambulation gait training and range of motion.  Our referral was sent. -Follow-up in 1 month, may consider midfoot corticosteroid injection into the naviculocuneiform joints if he is still having tenderness here.  Return in about 1 month (around 05/31/2023) for  post op (new x-rays).

## 2023-05-06 ENCOUNTER — Ambulatory Visit: Payer: Medicaid Other | Attending: Podiatry

## 2023-05-06 ENCOUNTER — Other Ambulatory Visit: Payer: Self-pay

## 2023-05-06 DIAGNOSIS — T8484XA Pain due to internal orthopedic prosthetic devices, implants and grafts, initial encounter: Secondary | ICD-10-CM | POA: Insufficient documentation

## 2023-05-06 DIAGNOSIS — M25572 Pain in left ankle and joints of left foot: Secondary | ICD-10-CM | POA: Insufficient documentation

## 2023-05-06 DIAGNOSIS — M7752 Other enthesopathy of left foot: Secondary | ICD-10-CM | POA: Diagnosis not present

## 2023-05-06 DIAGNOSIS — M6281 Muscle weakness (generalized): Secondary | ICD-10-CM | POA: Insufficient documentation

## 2023-05-06 DIAGNOSIS — R2689 Other abnormalities of gait and mobility: Secondary | ICD-10-CM | POA: Insufficient documentation

## 2023-05-06 DIAGNOSIS — R600 Localized edema: Secondary | ICD-10-CM | POA: Insufficient documentation

## 2023-05-06 NOTE — Therapy (Signed)
OUTPATIENT PHYSICAL THERAPY LOWER EXTREMITY EVALUATION   Patient Name: Shawn Harper MRN: [id number] DOB:08/13/1995, 27 y.o., male Today's Date: 05/06/2023  END OF SESSION:  PT End of Session - 05/06/23 1249     Visit Number 1    Number of Visits 17    Date for PT Re-Evaluation 07/01/23    Authorization Type [employer]    PT Start Time 1145    PT Stop Time 1220    PT Time Calculation (min) 35 min    Activity Tolerance Patient tolerated treatment well    Behavior During Therapy WFL for tasks assessed/performed             Past Medical History:  Diagnosis Date   Anxiety    Depression    Dysrhythmia    Headache    Smoker    5 pack years, trying to quit   Past Surgical History:  Procedure Laterality Date   APPLICATION OF WOUND VAC  03/08/2022   Procedure: APPLICATION OF WOUND VAC;  Surgeon: Edwin Cap, DPM;  Location: [facility] ORS;  Service: Podiatry;;   FLAT FOOT RECONSTRUCTION-TAL GASTROC RECESSION Left 03/08/2022   Procedure: FLAT FOOT RECONSTRUCTION WITH FUSION OF JOINTS IN BACK OF FOOT; POSSIBLE CALF MUSCLE LENGTHENING;BONEGRAFT INTO FOOT TO BUILD ARCH; BONEGRAFT FROM LEG;  Surgeon: Edwin Cap, DPM;  Location: [facility] ORS;  Service: Podiatry;  Laterality: Left;  POPLITEL AND SAPHENOUS BLOCK   LEG SURGERY     within a month of birth, in cast for infancy   Patient Active Problem List   Diagnosis Date Noted   Chronic pain syndrome 01/08/2023   Complex regional pain syndrome type 2 of left lower extremity 01/08/2023   Leg length discrepancy 01/08/2023   Abnormality of gait and mobility 01/08/2023   Coalition, talocalcaneal    Osteoarthritis of left ankle and foot    Smoker 01/28/2021   Unprotected sex 10/07/2014   GAD (generalized anxiety disorder) 10/07/2014   Depression 10/07/2014   Former smoker     PCP: Shelva Majestic, MD  REFERRING PROVIDER: Edwin Cap, DPM  REFERRING DIAG:  [phone] (ICD-10-CM) - Pain due to internal orthopedic prosthetic  devices, implants and grafts, initial encounter (HCC) M77.52 (ICD-10-CM) - Bone spur of left foot  THERAPY DIAG:  Pain in left ankle and joints of left foot - Plan: PT plan of care cert/re-cert  Muscle weakness (generalized) - Plan: PT plan of care cert/re-cert  Other abnormalities of gait and mobility - Plan: PT plan of care cert/re-cert  Localized edema - Plan: PT plan of care cert/re-cert  Rationale for Evaluation and Treatment: Rehabilitation  ONSET DATE: 03/28/2023  SUBJECTIVE:   SUBJECTIVE STATEMENT: Pt presents to PT s/p L foot surgery with removal of hardware peformed on 03/28/2023. He had previous L flat foot reconstructive surgery in 2023 with poor results and severe pain. Notes significant change in pain since recent surgery, has been able to drive and played golf last week although he had pain during. Continues to have N/T in L foot down to toes.   PERTINENT HISTORY: Previous L foot surgery; Anxiety and Depression  PAIN:  Are you having pain?  Yes: NPRS scale: 5/10 Worst: 8/10 Pain location: L foot Pain description: sharp, NT Aggravating factors: Standing Relieving factors: Rest, ice  PRECAUTIONS: None  RED FLAGS: None   WEIGHT BEARING RESTRICTIONS: Yes - WBAT  FALLS:  Has patient fallen in last 6 months? No  LIVING ENVIRONMENT: Lives with: lives with their family  Lives in: House/apartment  OCCUPATION: Door Comptroller  PLOF: Independent  PATIENT GOALS: decrease foot pain, get back to golfing and recreational activity without pain  NEXT MD VISIT: 05/21/2023  OBJECTIVE:   DIAGNOSTIC FINDINGS: See imaging  PATIENT SURVEYS:  FOTO: 43% function; 62% predicted  COGNITION: Overall cognitive status: Within functional limits for tasks assessed     SENSATION: Light touch: Impaired - L distal foot  POSTURE: No Significant postural limitations  PALPATION: Warmth noted at surgical site on medial ankle  LOWER EXTREMITY ROM:  Active ROM  Right eval Left eval  Hip flexion    Hip extension    Hip abduction    Hip adduction    Hip internal rotation    Hip external rotation    Knee flexion    Knee extension    Ankle dorsiflexion WNL 2  Ankle plantarflexion    Ankle inversion    Ankle eversion     (Blank rows = not tested)  LOWER EXTREMITY MMT:  MMT Right eval Left eval  Hip flexion    Hip extension    Hip abduction    Hip adduction    Hip internal rotation    Hip external rotation    Knee flexion    Knee extension    Ankle dorsiflexion WNL 4/5  Ankle plantarflexion WNL 3+/5  Ankle inversion WNL 2+/5  Ankle eversion WNL 2+/5   (Blank rows = not tested)  LOWER EXTREMITY SPECIAL TESTS:  DNT  FUNCTIONAL TESTS:  SLS: 3 seconds L  GAIT: Distance walked: 68ft Assistive device utilized: None Level of assistance: Complete Independence Comments: slight antalgic gait on L   TREATMENT: [facility] Adult PT Treatment:                                                DATE: 05/06/2023 Therapeutic Exercise: Gastroc stretch at wall x 30" L Long sitting calf stretch x 30" L Ankle DF x 10 RTB L Ankle inv/ev towel slide x 5 each Tandem stance x 30" L back  PATIENT EDUCATION:  Education details: eval findings, FOTO, HEP, POC Person educated: Patient Education method: Explanation, Demonstration, and Handouts Education comprehension: verbalized understanding and returned demonstration  HOME EXERCISE PROGRAM: Access Code: [email or web address] URL: [email or web address] Date: 05/06/2023 Prepared by: Edwinna Areola  Exercises - Gastroc Stretch on Wall  - 1 x daily - 7 x weekly - 2-3 reps - 30 sec hold - Long Sitting Calf Stretch with Strap  - 1 x daily - 7 x weekly - 2 reps - 30 sec hold - Ankle Dorsiflexion with Resistance  - 1 x daily - 7 x weekly - 3 sets - 10 reps - red band hold - Ankle Inversion Eversion Towel Slide  - 1 x daily - 7 x weekly - 3 sets - 10 reps - Standing Tandem Balance with Counter Support   - 1 x daily - 7 x weekly - 2-3 reps - 30 sec hold  ASSESSMENT:  CLINICAL IMPRESSION: Patient is a 27 y.o. M who was seen today for physical therapy evaluation and treatment for s/p L foot surgery with removal of hardware peformed on 03/28/2023. Physical findings are consistent with sugery and recovery timeline as pt demonstrates decrease in L ankle strength, stability, and ROM. FOTO score demonstrates decrease in subjective functional ability below PLOF. Pt would benefit form skilled PT services  working on improving strength, gait, and ROM post op.  OBJECTIVE IMPAIRMENTS: Abnormal gait, decreased activity tolerance, decreased balance, decreased mobility, difficulty walking, decreased ROM, decreased strength, and pain   ACTIVITY LIMITATIONS: standing, squatting, stairs, transfers, and locomotion level  PARTICIPATION LIMITATIONS: driving, shopping, community activity, occupation, and yard work  PERSONAL FACTORS: 1-2 comorbidities: Previous L foot surgery; Anxiety and Depression  are also affecting patient's functional outcome.   REHAB POTENTIAL: Excellent  CLINICAL DECISION MAKING: Stable/uncomplicated  EVALUATION COMPLEXITY: Low   GOALS: Goals reviewed with patient? No  SHORT TERM GOALS: Target date: 05/27/2023   Pt will be compliant and knowledgeable with initial HEP for improved comfort and carryover Baseline: initial HEP given  Goal status: INITIAL  2.  Pt will self report left foot pain no greater than 5/10 for improved comfort and functional ability Baseline: 8/10 at worst Goal status: INITIAL   LONG TERM GOALS: Target date: 07/01/2023   Pt will improve FOTO function score to no less than 62% as proxy for functional improvement with home ADLs and community activities Baseline: 43% function Goal status: INITIAL   2.  Pt will self report left foot pain no greater than 1-2/10 for improved comfort and functional ability Baseline: 8/10 at worst Goal status: INITIAL   3.  Pt  will improve L SLS time to no less than 30 seconds for improved stability and ankle proprioception for decreasing pain Baseline: 3 seconds Goal status: INITIAL  4.  Pt will improve L ankle DF to no less than 10 degrees past neutral for improving gait and decreasing foot/ankle pain Baseline: 2 degrees Goal status: INITIAL  5.  Pt will improve all L ankle MMT to no less than 4/5 for all tested motions for improving comfort and functional ability with home ADLs and community activities Baseline: see MMT chart Goal status: INITIAL   PLAN:  PT FREQUENCY: 2x/week  PT DURATION: 8 weeks  PLANNED INTERVENTIONS: Therapeutic exercises, Therapeutic activity, Neuromuscular re-education, Balance training, Gait training, Patient/Family education, Self Care, Joint mobilization, Dry Needling, Electrical stimulation, Cryotherapy, Moist heat, Vasopneumatic device, Manual therapy, and Re-evaluation  PLAN FOR NEXT SESSION: assess HEP response, ankle strengthening, balance, ankle ROM   Eloy End, PT 05/06/2023, 1:00 PM

## 2023-05-07 ENCOUNTER — Ambulatory Visit: Payer: Medicaid Other

## 2023-05-07 DIAGNOSIS — M6281 Muscle weakness (generalized): Secondary | ICD-10-CM

## 2023-05-07 DIAGNOSIS — M25572 Pain in left ankle and joints of left foot: Secondary | ICD-10-CM

## 2023-05-07 DIAGNOSIS — R2689 Other abnormalities of gait and mobility: Secondary | ICD-10-CM

## 2023-05-07 DIAGNOSIS — R600 Localized edema: Secondary | ICD-10-CM

## 2023-05-07 NOTE — Therapy (Signed)
OUTPATIENT PHYSICAL THERAPY TREATMENT   Patient Name: Shawn Harper MRN: [id number] DOB:1995/09/27, 27 y.o., male Today's Date: 05/07/2023  END OF SESSION:  PT End of Session - 05/07/23 0810     Visit Number 2    Number of Visits 17    Date for PT Re-Evaluation 07/01/23    Authorization Type [employer]    PT Start Time 0810   arrived late   PT Stop Time 0840    PT Time Calculation (min) 30 min    Activity Tolerance Patient tolerated treatment well    Behavior During Therapy WFL for tasks assessed/performed              Past Medical History:  Diagnosis Date   Anxiety    Depression    Dysrhythmia    Headache    Smoker    5 pack years, trying to quit   Past Surgical History:  Procedure Laterality Date   APPLICATION OF WOUND VAC  03/08/2022   Procedure: APPLICATION OF WOUND VAC;  Surgeon: Edwin Cap, DPM;  Location: [facility];  Service: Podiatry;;   FLAT FOOT RECONSTRUCTION-TAL GASTROC RECESSION Left 03/08/2022   Procedure: FLAT FOOT RECONSTRUCTION WITH FUSION OF JOINTS IN BACK OF FOOT; POSSIBLE CALF MUSCLE LENGTHENING;BONEGRAFT INTO FOOT TO BUILD ARCH; BONEGRAFT FROM LEG;  Surgeon: Edwin Cap, DPM;  Location: [facility];  Service: Podiatry;  Laterality: Left;  POPLITEL AND SAPHENOUS BLOCK   LEG SURGERY     within a month of birth, in cast for infancy   Patient Active Problem List   Diagnosis Date Noted   Chronic pain syndrome 01/08/2023   Complex regional pain syndrome type 2 of left lower extremity 01/08/2023   Leg length discrepancy 01/08/2023   Abnormality of gait and mobility 01/08/2023   Coalition, talocalcaneal    Osteoarthritis of left ankle and foot    Smoker 01/28/2021   Unprotected sex 10/07/2014   GAD (generalized anxiety disorder) 10/07/2014   Depression 10/07/2014   Former smoker     PCP: Shelva Majestic, MD  REFERRING PROVIDER: Edwin Cap, DPM  REFERRING DIAG:  [id number] (ICD-10-CM) - Pain due to internal orthopedic prosthetic  devices, implants and grafts, initial encounter (HCC) M77.52 (ICD-10-CM) - Bone spur of left foot  THERAPY DIAG:  Pain in left ankle and joints of left foot  Muscle weakness (generalized)  Other abnormalities of gait and mobility  Localized edema  Rationale for Evaluation and Treatment: Rehabilitation  ONSET DATE: 03/28/2023  SUBJECTIVE:   SUBJECTIVE STATEMENT: Pt presents to PT with reports of L midfoot stiffness. Has been compliant with HEP thus far.   PERTINENT HISTORY: Previous L foot surgery; Anxiety and Depression  PAIN:  Are you having pain?  Yes: NPRS scale: 5/10 Worst: 8/10 Pain location: L foot Pain description: sharp, NT Aggravating factors: Standing Relieving factors: Rest, ice  PRECAUTIONS: None  RED FLAGS: None   WEIGHT BEARING RESTRICTIONS: Yes - WBAT  FALLS:  Has patient fallen in last 6 months? No  LIVING ENVIRONMENT: Lives with: lives with their family Lives in: House/apartment  OCCUPATION: Door Comptroller  PLOF: Independent  PATIENT GOALS: decrease foot pain, get back to golfing and recreational activity without pain  NEXT MD VISIT: 05/21/2023  OBJECTIVE:   DIAGNOSTIC FINDINGS: See imaging  PATIENT SURVEYS:  FOTO: 43% function; 62% predicted  COGNITION: Overall cognitive status: Within functional limits for tasks assessed     SENSATION: Light touch: Impaired - L distal foot  POSTURE:  No Significant postural limitations  PALPATION: Warmth noted at surgical site on medial ankle  LOWER EXTREMITY ROM:  Active ROM Right eval Left eval  Hip flexion    Hip extension    Hip abduction    Hip adduction    Hip internal rotation    Hip external rotation    Knee flexion    Knee extension    Ankle dorsiflexion WNL 2  Ankle plantarflexion    Ankle inversion    Ankle eversion     (Blank rows = not tested)  LOWER EXTREMITY MMT:  MMT Right eval Left eval  Hip flexion    Hip extension    Hip abduction    Hip adduction     Hip internal rotation    Hip external rotation    Knee flexion    Knee extension    Ankle dorsiflexion WNL 4/5  Ankle plantarflexion WNL 3+/5  Ankle inversion WNL 2+/5  Ankle eversion WNL 2+/5   (Blank rows = not tested)  LOWER EXTREMITY SPECIAL TESTS:  DNT  FUNCTIONAL TESTS:  SLS: 3 seconds L  GAIT: Distance walked: 69ft Assistive device utilized: None Level of assistance: Complete Independence Comments: slight antalgic gait on L   TREATMENT: [facility] Adult PT Treatment:                                                DATE: 05/07/2023 Therapeutic Exercise: Rec bike lvl 3 x 3 min while taking subjective Slant board gastroc stretch 2x30" Slant board soleus stretch 2x30" Tandem stance 2x30" L Tandem walk x 2 laps in // Heel-Toe raise 2x10 Lateral walk on toes x 2 laps in // Ankle DF 2x15 RTB Seated heel raise 2x20 15# KB BAPS L2 cw/ccw L x 15 each  [facility] Adult PT Treatment:                                                DATE: 05/06/2023 Therapeutic Exercise: Gastroc stretch at wall x 30" L Long sitting calf stretch x 30" L Ankle DF x 10 RTB L Ankle inv/ev towel slide x 5 each Tandem stance x 30" L back  PATIENT EDUCATION:  Education details: eval findings, FOTO, HEP, POC Person educated: Patient Education method: Explanation, Demonstration, and Handouts Education comprehension: verbalized understanding and returned demonstration  HOME EXERCISE PROGRAM: Access Code: [email or web address] URL: [email or web address] Date: 05/06/2023 Prepared by: Edwinna Areola  Exercises - Gastroc Stretch on Wall  - 1 x daily - 7 x weekly - 2-3 reps - 30 sec hold - Long Sitting Calf Stretch with Strap  - 1 x daily - 7 x weekly - 2 reps - 30 sec hold - Ankle Dorsiflexion with Resistance  - 1 x daily - 7 x weekly - 3 sets - 10 reps - red band hold - Ankle Inversion Eversion Towel Slide  - 1 x daily - 7 x weekly - 3 sets - 10 reps - Standing Tandem Balance with Counter Support  - 1 x  daily - 7 x weekly - 2-3 reps - 30 sec hold  ASSESSMENT:  CLINICAL IMPRESSION: Pt was able to complete all prescribed exercises with no adverse effect. Therapy focused on improving ankle stability, strength, and balance. Pt  is progressing well thus far, will continue per POC.   OBJECTIVE IMPAIRMENTS: Abnormal gait, decreased activity tolerance, decreased balance, decreased mobility, difficulty walking, decreased ROM, decreased strength, and pain   ACTIVITY LIMITATIONS: standing, squatting, stairs, transfers, and locomotion level  PARTICIPATION LIMITATIONS: driving, shopping, community activity, occupation, and yard work  PERSONAL FACTORS: 1-2 comorbidities: Previous L foot surgery; Anxiety and Depression  are also affecting patient's functional outcome.   REHAB POTENTIAL: Excellent  CLINICAL DECISION MAKING: Stable/uncomplicated  EVALUATION COMPLEXITY: Low   GOALS: Goals reviewed with patient? No  SHORT TERM GOALS: Target date: 05/27/2023   Pt will be compliant and knowledgeable with initial HEP for improved comfort and carryover Baseline: initial HEP given  Goal status: INITIAL  2.  Pt will self report left foot pain no greater than 5/10 for improved comfort and functional ability Baseline: 8/10 at worst Goal status: INITIAL   LONG TERM GOALS: Target date: 07/01/2023   Pt will improve FOTO function score to no less than 62% as proxy for functional improvement with home ADLs and community activities Baseline: 43% function Goal status: INITIAL   2.  Pt will self report left foot pain no greater than 1-2/10 for improved comfort and functional ability Baseline: 8/10 at worst Goal status: INITIAL   3.  Pt will improve L SLS time to no less than 30 seconds for improved stability and ankle proprioception for decreasing pain Baseline: 3 seconds Goal status: INITIAL  4.  Pt will improve L ankle DF to no less than 10 degrees past neutral for improving gait and decreasing  foot/ankle pain Baseline: 2 degrees Goal status: INITIAL  5.  Pt will improve all L ankle MMT to no less than 4/5 for all tested motions for improving comfort and functional ability with home ADLs and community activities Baseline: see MMT chart Goal status: INITIAL   PLAN:  PT FREQUENCY: 2x/week  PT DURATION: 8 weeks  PLANNED INTERVENTIONS: Therapeutic exercises, Therapeutic activity, Neuromuscular re-education, Balance training, Gait training, Patient/Family education, Self Care, Joint mobilization, Dry Needling, Electrical stimulation, Cryotherapy, Moist heat, Vasopneumatic device, Manual therapy, and Re-evaluation  PLAN FOR NEXT SESSION: assess HEP response, ankle strengthening, balance, ankle ROM   Eloy End, PT 05/07/2023, 8:44 AM

## 2023-05-08 ENCOUNTER — Encounter: Payer: Medicaid Other | Admitting: Podiatry

## 2023-05-15 ENCOUNTER — Ambulatory Visit: Payer: Medicaid Other | Attending: Podiatry

## 2023-05-15 DIAGNOSIS — M25572 Pain in left ankle and joints of left foot: Secondary | ICD-10-CM | POA: Diagnosis present

## 2023-05-15 DIAGNOSIS — R600 Localized edema: Secondary | ICD-10-CM | POA: Diagnosis present

## 2023-05-15 DIAGNOSIS — R2689 Other abnormalities of gait and mobility: Secondary | ICD-10-CM | POA: Diagnosis present

## 2023-05-15 DIAGNOSIS — M6281 Muscle weakness (generalized): Secondary | ICD-10-CM | POA: Diagnosis present

## 2023-05-15 NOTE — Therapy (Signed)
OUTPATIENT PHYSICAL THERAPY TREATMENT   Patient Name: Shawn Harper MRN: [id number] DOB:Dec 25, 1995, 27 y.o., male Today's Date: 05/15/2023  END OF SESSION:  PT End of Session - 05/15/23 0800     Visit Number 3    Number of Visits 17    Date for PT Re-Evaluation 07/01/23    Authorization Type [employer]    PT Start Time 0800    PT Stop Time 0839    PT Time Calculation (min) 39 min    Activity Tolerance Patient tolerated treatment well    Behavior During Therapy WFL for tasks assessed/performed               Past Medical History:  Diagnosis Date   Anxiety    Depression    Dysrhythmia    Headache    Smoker    5 pack years, trying to quit   Past Surgical History:  Procedure Laterality Date   APPLICATION OF WOUND VAC  03/08/2022   Procedure: APPLICATION OF WOUND VAC;  Surgeon: Edwin Cap, DPM;  Location: [facility];  Service: Podiatry;;   FLAT FOOT RECONSTRUCTION-TAL GASTROC RECESSION Left 03/08/2022   Procedure: FLAT FOOT RECONSTRUCTION WITH FUSION OF JOINTS IN BACK OF FOOT; POSSIBLE CALF MUSCLE LENGTHENING;BONEGRAFT INTO FOOT TO BUILD ARCH; BONEGRAFT FROM LEG;  Surgeon: Edwin Cap, DPM;  Location: [facility];  Service: Podiatry;  Laterality: Left;  POPLITEL AND SAPHENOUS BLOCK   LEG SURGERY     within a month of birth, in cast for infancy   Patient Active Problem List   Diagnosis Date Noted   Chronic pain syndrome 01/08/2023   Complex regional pain syndrome type 2 of left lower extremity 01/08/2023   Leg length discrepancy 01/08/2023   Abnormality of gait and mobility 01/08/2023   Coalition, talocalcaneal    Osteoarthritis of left ankle and foot    Smoker 01/28/2021   Unprotected sex 10/07/2014   GAD (generalized anxiety disorder) 10/07/2014   Depression 10/07/2014   Former smoker     PCP: Shelva Majestic, MD  REFERRING PROVIDER: Edwin Cap, DPM  REFERRING DIAG:  [phone] (ICD-10-CM) - Pain due to internal orthopedic prosthetic devices,  implants and grafts, initial encounter (HCC) M77.52 (ICD-10-CM) - Bone spur of left foot  THERAPY DIAG:  Pain in left ankle and joints of left foot  Muscle weakness (generalized)  Other abnormalities of gait and mobility  Rationale for Evaluation and Treatment: Rehabilitation  ONSET DATE: 03/28/2023  SUBJECTIVE:   SUBJECTIVE STATEMENT: Pt presents to PT with reports of continued L foot pain. Notes that he had sharp increase in pain after last session.   PERTINENT HISTORY: Previous L foot surgery; Anxiety and Depression  PAIN:  Are you having pain?  Yes: NPRS scale: 5/10 Worst: 8/10 Pain location: L foot Pain description: sharp, NT Aggravating factors: Standing Relieving factors: Rest, ice  PRECAUTIONS: None  RED FLAGS: None   WEIGHT BEARING RESTRICTIONS: Yes - WBAT  FALLS:  Has patient fallen in last 6 months? No  LIVING ENVIRONMENT: Lives with: lives with their family Lives in: House/apartment  OCCUPATION: Door Comptroller  PLOF: Independent  PATIENT GOALS: decrease foot pain, get back to golfing and recreational activity without pain  NEXT MD VISIT: 05/21/2023  OBJECTIVE:   DIAGNOSTIC FINDINGS: See imaging  PATIENT SURVEYS:  FOTO: 43% function; 62% predicted  COGNITION: Overall cognitive status: Within functional limits for tasks assessed     SENSATION: Light touch: Impaired - L distal foot  POSTURE:  No Significant postural limitations  PALPATION: Warmth noted at surgical site on medial ankle  LOWER EXTREMITY ROM:  Active ROM Right eval Left eval  Hip flexion    Hip extension    Hip abduction    Hip adduction    Hip internal rotation    Hip external rotation    Knee flexion    Knee extension    Ankle dorsiflexion WNL 2  Ankle plantarflexion    Ankle inversion    Ankle eversion     (Blank rows = not tested)  LOWER EXTREMITY MMT:  MMT Right eval Left eval  Hip flexion    Hip extension    Hip abduction    Hip adduction     Hip internal rotation    Hip external rotation    Knee flexion    Knee extension    Ankle dorsiflexion WNL 4/5  Ankle plantarflexion WNL 3+/5  Ankle inversion WNL 2+/5  Ankle eversion WNL 2+/5   (Blank rows = not tested)  LOWER EXTREMITY SPECIAL TESTS:  DNT  FUNCTIONAL TESTS:  SLS: 3 seconds L  GAIT: Distance walked: 37ft Assistive device utilized: None Level of assistance: Complete Independence Comments: slight antalgic gait on L   TREATMENT: [facility] Adult PT Treatment:                                                DATE: 05/15/2023 Therapeutic Exercise: Rec bike lvl 3 x 3 min while taking subjective Slant board gastroc stretch 2x30" Slant board soleus stretch 2x30" Tandem stance 2x30" L Tandem walk on foam beam x 2 laps L ankle DF/Inv/Ev 2x15 YTB L ankle PF 2x15 black band L heel raise with 15# KB 2x15 BAPS L2 cw/ccw L x 15 each  [facility] Adult PT Treatment:                                                DATE: 05/07/2023 Therapeutic Exercise: Rec bike lvl 3 x 3 min while taking subjective Slant board gastroc stretch 2x30" Slant board soleus stretch 2x30" Tandem stance 2x30" L Tandem walk x 2 laps in // Heel-Toe raise 2x10 Lateral walk on toes x 2 laps in // Ankle DF 2x15 RTB Seated heel raise 2x20 15# KB BAPS L2 cw/ccw L x 15 each  [facility] Adult PT Treatment:                                                DATE: 05/06/2023 Therapeutic Exercise: Gastroc stretch at wall x 30" L Long sitting calf stretch x 30" L Ankle DF x 10 RTB L Ankle inv/ev towel slide x 5 each Tandem stance x 30" L back  PATIENT EDUCATION:  Education details: eval findings, FOTO, HEP, POC Person educated: Patient Education method: Explanation, Demonstration, and Handouts Education comprehension: verbalized understanding and returned demonstration  HOME EXERCISE PROGRAM: Access Code: [email or web address] URL: [email or web address] Date: 05/15/2023 Prepared by: Edwinna Areola  Exercises -  Gastroc Stretch on Wall  - 1 x daily - 7 x weekly - 2-3 reps - 30 sec hold - Long Sitting Calf Stretch  with Strap  - 1 x daily - 7 x weekly - 2 reps - 30 sec hold - Ankle Dorsiflexion with Resistance  - 1 x daily - 7 x weekly - 3 sets - 10 reps - red band hold - Ankle Inversion Eversion Towel Slide  - 1 x daily - 7 x weekly - 3 sets - 10 reps - Standing Tandem Balance with Counter Support  - 1 x daily - 7 x weekly - 2-3 reps - 30 sec hold - Ankle and Toe Plantarflexion with Resistance  - 1 x daily - 7 x weekly - 3 sets - 15 reps - black band hold  ASSESSMENT:  CLINICAL IMPRESSION: Pt was able to once again complete all prescribed exercises with no adverse effect. Therapy focused on improving ankle stability, strength, and balance. Pt is progressing well thus far, will continue per POC.   OBJECTIVE IMPAIRMENTS: Abnormal gait, decreased activity tolerance, decreased balance, decreased mobility, difficulty walking, decreased ROM, decreased strength, and pain   ACTIVITY LIMITATIONS: standing, squatting, stairs, transfers, and locomotion level  PARTICIPATION LIMITATIONS: driving, shopping, community activity, occupation, and yard work  PERSONAL FACTORS: 1-2 comorbidities: Previous L foot surgery; Anxiety and Depression  are also affecting patient's functional outcome.   REHAB POTENTIAL: Excellent  CLINICAL DECISION MAKING: Stable/uncomplicated  EVALUATION COMPLEXITY: Low   GOALS: Goals reviewed with patient? No  SHORT TERM GOALS: Target date: 05/27/2023   Pt will be compliant and knowledgeable with initial HEP for improved comfort and carryover Baseline: initial HEP given  Goal status: MET  2.  Pt will self report left foot pain no greater than 5/10 for improved comfort and functional ability Baseline: 8/10 at worst Goal status: INITIAL   LONG TERM GOALS: Target date: 07/01/2023   Pt will improve FOTO function score to no less than 62% as proxy for functional improvement with  home ADLs and community activities Baseline: 43% function Goal status: INITIAL   2.  Pt will self report left foot pain no greater than 1-2/10 for improved comfort and functional ability Baseline: 8/10 at worst Goal status: INITIAL   3.  Pt will improve L SLS time to no less than 30 seconds for improved stability and ankle proprioception for decreasing pain Baseline: 3 seconds Goal status: INITIAL  4.  Pt will improve L ankle DF to no less than 10 degrees past neutral for improving gait and decreasing foot/ankle pain Baseline: 2 degrees Goal status: INITIAL  5.  Pt will improve all L ankle MMT to no less than 4/5 for all tested motions for improving comfort and functional ability with home ADLs and community activities Baseline: see MMT chart Goal status: INITIAL   PLAN:  PT FREQUENCY: 2x/week  PT DURATION: 8 weeks  PLANNED INTERVENTIONS: Therapeutic exercises, Therapeutic activity, Neuromuscular re-education, Balance training, Gait training, Patient/Family education, Self Care, Joint mobilization, Dry Needling, Electrical stimulation, Cryotherapy, Moist heat, Vasopneumatic device, Manual therapy, and Re-evaluation  PLAN FOR NEXT SESSION: assess HEP response, ankle strengthening, balance, ankle ROM   Eloy End, PT 05/15/2023, 8:39 AM

## 2023-05-20 ENCOUNTER — Ambulatory Visit: Payer: Medicaid Other

## 2023-05-20 DIAGNOSIS — M25572 Pain in left ankle and joints of left foot: Secondary | ICD-10-CM

## 2023-05-20 DIAGNOSIS — M6281 Muscle weakness (generalized): Secondary | ICD-10-CM

## 2023-05-20 DIAGNOSIS — R600 Localized edema: Secondary | ICD-10-CM

## 2023-05-20 DIAGNOSIS — R2689 Other abnormalities of gait and mobility: Secondary | ICD-10-CM

## 2023-05-20 NOTE — Therapy (Signed)
OUTPATIENT PHYSICAL THERAPY TREATMENT   Patient Name: BRYLEN WAGAR MRN: [id number] DOB:1996/02/08, 27 y.o., male Today's Date: 05/20/2023  END OF SESSION:  PT End of Session - 05/20/23 0911     Visit Number 4    Number of Visits 17    Date for PT Re-Evaluation 07/01/23    Authorization Type [place] [employer]    PT Start Time 0912    PT Stop Time 0952    PT Time Calculation (min) 40 min    Activity Tolerance Patient tolerated treatment well    Behavior During Therapy WFL for tasks assessed/performed              Past Medical History:  Diagnosis Date   Anxiety    Depression    Dysrhythmia    Headache    Smoker    5 pack years, trying to quit   Past Surgical History:  Procedure Laterality Date   APPLICATION OF WOUND VAC  03/08/2022   Procedure: APPLICATION OF WOUND VAC;  Surgeon: Edwin Cap, DPM;  Location: [facility];  Service: Podiatry;;   FLAT FOOT RECONSTRUCTION-TAL GASTROC RECESSION Left 03/08/2022   Procedure: FLAT FOOT RECONSTRUCTION WITH FUSION OF JOINTS IN BACK OF FOOT; POSSIBLE CALF MUSCLE LENGTHENING;BONEGRAFT INTO FOOT TO BUILD ARCH; BONEGRAFT FROM LEG;  Surgeon: Edwin Cap, DPM;  Location: [facility];  Service: Podiatry;  Laterality: Left;  POPLITEL AND SAPHENOUS BLOCK   LEG SURGERY     within a month of birth, in cast for infancy   Patient Active Problem List   Diagnosis Date Noted   Chronic pain syndrome 01/08/2023   Complex regional pain syndrome type 2 of left lower extremity 01/08/2023   Leg length discrepancy 01/08/2023   Abnormality of gait and mobility 01/08/2023   Coalition, talocalcaneal    Osteoarthritis of left ankle and foot    Smoker 01/28/2021   Unprotected sex 10/07/2014   GAD (generalized anxiety disorder) 10/07/2014   Depression 10/07/2014   Former smoker     PCP: Shelva Majestic, MD  REFERRING PROVIDER: Edwin Cap, DPM  REFERRING DIAG:  [phone] (ICD-10-CM) - Pain due to internal orthopedic prosthetic devices,  implants and grafts, initial encounter (HCC) M77.52 (ICD-10-CM) - Bone spur of left foot  THERAPY DIAG:  Pain in left ankle and joints of left foot  Muscle weakness (generalized)  Other abnormalities of gait and mobility  Localized edema  Rationale for Evaluation and Treatment: Rehabilitation  ONSET DATE: 03/28/2023  SUBJECTIVE:   SUBJECTIVE STATEMENT: Patient reports that his pain has been at an 8/10 since yesterday, no particular trigger.   PERTINENT HISTORY: Previous L foot surgery; Anxiety and Depression  PAIN:  Are you having pain?  Yes: NPRS scale: 5/10 Worst: 8/10 Pain location: L foot Pain description: sharp, NT Aggravating factors: Standing Relieving factors: Rest, ice  PRECAUTIONS: None  RED FLAGS: None   WEIGHT BEARING RESTRICTIONS: Yes - WBAT  FALLS:  Has patient fallen in last 6 months? No  LIVING ENVIRONMENT: Lives with: lives with their family Lives in: House/apartment  OCCUPATION: Door Comptroller  PLOF: Independent  PATIENT GOALS: decrease foot pain, get back to golfing and recreational activity without pain  NEXT MD VISIT: 05/21/2023  OBJECTIVE:   DIAGNOSTIC FINDINGS: See imaging  PATIENT SURVEYS:  FOTO: 43% function; 62% predicted  COGNITION: Overall cognitive status: Within functional limits for tasks assessed     SENSATION: Light touch: Impaired - L distal foot  POSTURE: No Significant postural limitations  PALPATION: Warmth noted at surgical site on medial ankle  LOWER EXTREMITY ROM:  Active ROM Right eval Left eval  Hip flexion    Hip extension    Hip abduction    Hip adduction    Hip internal rotation    Hip external rotation    Knee flexion    Knee extension    Ankle dorsiflexion WNL 2  Ankle plantarflexion    Ankle inversion    Ankle eversion     (Blank rows = not tested)  LOWER EXTREMITY MMT:  MMT Right eval Left eval  Hip flexion    Hip extension    Hip abduction    Hip adduction    Hip  internal rotation    Hip external rotation    Knee flexion    Knee extension    Ankle dorsiflexion WNL 4/5  Ankle plantarflexion WNL 3+/5  Ankle inversion WNL 2+/5  Ankle eversion WNL 2+/5   (Blank rows = not tested)  LOWER EXTREMITY SPECIAL TESTS:  DNT  FUNCTIONAL TESTS:  SLS: 3 seconds L  GAIT: Distance walked: 84ft Assistive device utilized: None Level of assistance: Complete Independence Comments: slight antalgic gait on L   TREATMENT: [facility] Adult PT Treatment:                                                DATE: 05/20/23 Therapeutic Exercise: Rec bike lvl 3 x 5 min while taking subjective Slant board gastroc stretch 2x30" Slant board soleus stretch x30" (painful) Tandem stance 2x30" L Tandem walk on foam beam x 2 laps Sidestepping on foam beam heel hang x2 laps (toe hang too painful) L ankle DF/Inv/Ev 2x15 YTB L ankle PF 2x15 black band L heel raise with 15# KB 2x15 BAPS L2 cw/ccw L x 15 each   [facility] Adult PT Treatment:                                                DATE: 05/15/2023 Therapeutic Exercise: Rec bike lvl 3 x 3 min while taking subjective Slant board gastroc stretch 2x30" Slant board soleus stretch 2x30" Tandem stance 2x30" L Tandem walk on foam beam x 2 laps L ankle DF/Inv/Ev 2x15 YTB L ankle PF 2x15 black band L heel raise with 15# KB 2x15 BAPS L2 cw/ccw L x 15 each  [facility] Adult PT Treatment:                                                DATE: 05/07/2023 Therapeutic Exercise: Rec bike lvl 3 x 3 min while taking subjective Slant board gastroc stretch 2x30" Slant board soleus stretch 2x30" Tandem stance 2x30" L Tandem walk x 2 laps in // Heel-Toe raise 2x10 Lateral walk on toes x 2 laps in // Ankle DF 2x15 RTB Seated heel raise 2x20 15# KB BAPS L2 cw/ccw L x 15 each   PATIENT EDUCATION:  Education details: eval findings, FOTO, HEP, POC Person educated: Patient Education method: Explanation, Demonstration, and Handouts Education  comprehension: verbalized understanding and returned demonstration  HOME EXERCISE PROGRAM: Access Code: [email or web address] URL: [email or web address]  Date: 05/15/2023 Prepared by: Edwinna Areola  Exercises - Gastroc Stretch on Wall  - 1 x daily - 7 x weekly - 2-3 reps - 30 sec hold - Long Sitting Calf Stretch with Strap  - 1 x daily - 7 x weekly - 2 reps - 30 sec hold - Ankle Dorsiflexion with Resistance  - 1 x daily - 7 x weekly - 3 sets - 10 reps - red band hold - Ankle Inversion Eversion Towel Slide  - 1 x daily - 7 x weekly - 3 sets - 10 reps - Standing Tandem Balance with Counter Support  - 1 x daily - 7 x weekly - 2-3 reps - 30 sec hold - Ankle and Toe Plantarflexion with Resistance  - 1 x daily - 7 x weekly - 3 sets - 15 reps - black band hold  ASSESSMENT:  CLINICAL IMPRESSION: Patient presents to PT reporting increased pain in his Lt ankle since yesterday, rating at an 8/10, and states that this is normal for him. Session today continued to focus on ankle strengthening and proprioception as well as stretching. He reports a sharp increase in pain with soleus stretch, terminated this exercise early. Patient continues to benefit from skilled PT services and should be progressed as able to improve functional independence.     OBJECTIVE IMPAIRMENTS: Abnormal gait, decreased activity tolerance, decreased balance, decreased mobility, difficulty walking, decreased ROM, decreased strength, and pain   ACTIVITY LIMITATIONS: standing, squatting, stairs, transfers, and locomotion level  PARTICIPATION LIMITATIONS: driving, shopping, community activity, occupation, and yard work  PERSONAL FACTORS: 1-2 comorbidities: Previous L foot surgery; Anxiety and Depression  are also affecting patient's functional outcome.   REHAB POTENTIAL: Excellent  CLINICAL DECISION MAKING: Stable/uncomplicated  EVALUATION COMPLEXITY: Low   GOALS: Goals reviewed with patient? No  SHORT TERM GOALS: Target  date: 05/27/2023   Pt will be compliant and knowledgeable with initial HEP for improved comfort and carryover Baseline: initial HEP given  Goal status: MET  2.  Pt will self report left foot pain no greater than 5/10 for improved comfort and functional ability Baseline: 8/10 at worst Goal status: INITIAL   LONG TERM GOALS: Target date: 07/01/2023   Pt will improve FOTO function score to no less than 62% as proxy for functional improvement with home ADLs and community activities Baseline: 43% function Goal status: INITIAL   2.  Pt will self report left foot pain no greater than 1-2/10 for improved comfort and functional ability Baseline: 8/10 at worst Goal status: INITIAL   3.  Pt will improve L SLS time to no less than 30 seconds for improved stability and ankle proprioception for decreasing pain Baseline: 3 seconds Goal status: INITIAL  4.  Pt will improve L ankle DF to no less than 10 degrees past neutral for improving gait and decreasing foot/ankle pain Baseline: 2 degrees Goal status: INITIAL  5.  Pt will improve all L ankle MMT to no less than 4/5 for all tested motions for improving comfort and functional ability with home ADLs and community activities Baseline: see MMT chart Goal status: INITIAL   PLAN:  PT FREQUENCY: 2x/week  PT DURATION: 8 weeks  PLANNED INTERVENTIONS: Therapeutic exercises, Therapeutic activity, Neuromuscular re-education, Balance training, Gait training, Patient/Family education, Self Care, Joint mobilization, Dry Needling, Electrical stimulation, Cryotherapy, Moist heat, Vasopneumatic device, Manual therapy, and Re-evaluation  PLAN FOR NEXT SESSION: assess HEP response, ankle strengthening, balance, ankle ROM   Berta Minor, PTA 05/20/2023, 9:51 AM

## 2023-05-21 ENCOUNTER — Encounter: Payer: Medicaid Other | Admitting: Physical Medicine and Rehabilitation

## 2023-05-22 ENCOUNTER — Ambulatory Visit: Payer: Medicaid Other

## 2023-05-27 ENCOUNTER — Ambulatory Visit: Payer: Medicaid Other

## 2023-05-30 ENCOUNTER — Ambulatory Visit: Payer: Medicaid Other

## 2023-05-30 DIAGNOSIS — M25572 Pain in left ankle and joints of left foot: Secondary | ICD-10-CM

## 2023-05-30 DIAGNOSIS — R2689 Other abnormalities of gait and mobility: Secondary | ICD-10-CM

## 2023-05-30 DIAGNOSIS — M6281 Muscle weakness (generalized): Secondary | ICD-10-CM

## 2023-05-30 DIAGNOSIS — R600 Localized edema: Secondary | ICD-10-CM

## 2023-05-30 NOTE — Therapy (Signed)
OUTPATIENT PHYSICAL THERAPY TREATMENT   Patient Name: Shawn Harper MRN: [id number] DOB:[birth year], 27 y.o., male Today's Date: 05/30/2023  END OF SESSION:  PT End of Session - 05/30/23 0926     Visit Number 5    Number of Visits 17    Date for PT Re-Evaluation 07/01/23    Authorization Type [place] [employer]    PT Start Time 0930    Activity Tolerance Patient tolerated treatment well    Behavior During Therapy WFL for tasks assessed/performed               Past Medical History:  Diagnosis Date   Anxiety    Depression    Dysrhythmia    Headache    Smoker    5 pack years, trying to quit   Past Surgical History:  Procedure Laterality Date   APPLICATION OF WOUND VAC  03/08/2022   Procedure: APPLICATION OF WOUND VAC;  Surgeon: Edwin Cap, DPM;  Location: [facility];  Service: Podiatry;;   FLAT FOOT RECONSTRUCTION-TAL GASTROC RECESSION Left 03/08/2022   Procedure: FLAT FOOT RECONSTRUCTION WITH FUSION OF JOINTS IN BACK OF FOOT; POSSIBLE CALF MUSCLE LENGTHENING;BONEGRAFT INTO FOOT TO BUILD ARCH; BONEGRAFT FROM LEG;  Surgeon: Edwin Cap, DPM;  Location: [facility];  Service: Podiatry;  Laterality: Left;  POPLITEL AND SAPHENOUS BLOCK   LEG SURGERY     within a month of birth, in cast for infancy   Patient Active Problem List   Diagnosis Date Noted   Chronic pain syndrome 01/08/2023   Complex regional pain syndrome type 2 of left lower extremity 01/08/2023   Leg length discrepancy 01/08/2023   Abnormality of gait and mobility 01/08/2023   Coalition, talocalcaneal    Osteoarthritis of left ankle and foot    Smoker 01/28/2021   Unprotected sex 10/07/2014   GAD (generalized anxiety disorder) 10/07/2014   Depression 10/07/2014   Former smoker     PCP: Shelva Majestic, MD  REFERRING PROVIDER: Edwin Cap, DPM  REFERRING DIAG:  [phone] (ICD-10-CM) - Pain due to internal orthopedic prosthetic devices, implants and grafts, initial encounter (HCC) M77.52  (ICD-10-CM) - Bone spur of left foot  THERAPY DIAG:  Pain in left ankle and joints of left foot  Muscle weakness (generalized)  Other abnormalities of gait and mobility  Localized edema  Rationale for Evaluation and Treatment: Rehabilitation  ONSET DATE: 03/28/2023  SUBJECTIVE:   SUBJECTIVE STATEMENT: Patient reports that his pain has been at an 8/10 since yesterday, no particular trigger.   PERTINENT HISTORY: Previous L foot surgery; Anxiety and Depression  PAIN:  Are you having pain?  Yes: NPRS scale: 5/10 Worst: 8/10 Pain location: L foot Pain description: sharp, NT Aggravating factors: Standing Relieving factors: Rest, ice  PRECAUTIONS: None  RED FLAGS: None   WEIGHT BEARING RESTRICTIONS: Yes - WBAT  FALLS:  Has patient fallen in last 6 months? No  LIVING ENVIRONMENT: Lives with: lives with their family Lives in: House/apartment  OCCUPATION: Door Comptroller  PLOF: Independent  PATIENT GOALS: decrease foot pain, get back to golfing and recreational activity without pain  NEXT MD VISIT: 05/21/2023  OBJECTIVE:   DIAGNOSTIC FINDINGS: See imaging  PATIENT SURVEYS:  FOTO: 43% function; 62% predicted  COGNITION: Overall cognitive status: Within functional limits for tasks assessed     SENSATION: Light touch: Impaired - L distal foot  POSTURE: No Significant postural limitations  PALPATION: Warmth noted at surgical site on medial ankle  LOWER EXTREMITY ROM:  Active  ROM Right eval Left eval  Hip flexion    Hip extension    Hip abduction    Hip adduction    Hip internal rotation    Hip external rotation    Knee flexion    Knee extension    Ankle dorsiflexion WNL 2  Ankle plantarflexion    Ankle inversion    Ankle eversion     (Blank rows = not tested)  LOWER EXTREMITY MMT:  MMT Right eval Left eval  Hip flexion    Hip extension    Hip abduction    Hip adduction    Hip internal rotation    Hip external rotation    Knee  flexion    Knee extension    Ankle dorsiflexion WNL 4/5  Ankle plantarflexion WNL 3+/5  Ankle inversion WNL 2+/5  Ankle eversion WNL 2+/5   (Blank rows = not tested)  LOWER EXTREMITY SPECIAL TESTS:  DNT  FUNCTIONAL TESTS:  SLS: 3 seconds L  GAIT: Distance walked: 41ft Assistive device utilized: None Level of assistance: Complete Independence Comments: slight antalgic gait on L   TREATMENT: [facility] Adult PT Treatment:                                                DATE: 05/30/23 Therapeutic Exercise: Towel scrunch x 60"  [place] pickups L foot L ankle PF 25# KB 3x15 L calf stretch 2x45" L ankle DF/Inv/Ev 3x15 RTB L ankle PF 2x15 black band BAPS L3 cw/ccw L 2x15 each Slant board gastroc stretch 2x30" Neuromuscular Re-ed: Kickstand KB cross 2x30" 15# L stance Tandem stance on foam 2x30" L Tandem walk on foam beam x 2 laps Step with high march 8in 2x15 L stance   [facility] Adult PT Treatment:                                                DATE: 05/20/23 Therapeutic Exercise: Rec bike lvl 3 x 5 min while taking subjective Slant board gastroc stretch 2x30" Slant board soleus stretch x30" (painful) Tandem stance 2x30" L Tandem walk on foam beam x 2 laps Sidestepping on foam beam heel hang x2 laps (toe hang too painful) L ankle DF/Inv/Ev 2x15 YTB L ankle PF 2x15 black band L heel raise with 15# KB 2x15 BAPS L2 cw/ccw L x 15 each   [facility] Adult PT Treatment:                                                DATE: 05/15/2023 Therapeutic Exercise: Rec bike lvl 3 x 3 min while taking subjective Slant board gastroc stretch 2x30" Slant board soleus stretch 2x30" Tandem stance 2x30" L Tandem walk on foam beam x 2 laps L ankle DF/Inv/Ev 2x15 YTB L ankle PF 2x15 black band L heel raise with 15# KB 2x15 BAPS L2 cw/ccw L x 15 each   PATIENT EDUCATION:  Education details: eval findings, FOTO, HEP, POC Person educated: Patient Education method: Explanation, Demonstration, and  Handouts Education comprehension: verbalized understanding and returned demonstration  HOME EXERCISE PROGRAM: Access Code: [email or web address] URL: [email or web address] Date: 05/15/2023  Prepared by: Edwinna Areola  Exercises - Gastroc Stretch on Wall  - 1 x daily - 7 x weekly - 2-3 reps - 30 sec hold - Long Sitting Calf Stretch with Strap  - 1 x daily - 7 x weekly - 2 reps - 30 sec hold - Ankle Dorsiflexion with Resistance  - 1 x daily - 7 x weekly - 3 sets - 10 reps - red band hold - Ankle Inversion Eversion Towel Slide  - 1 x daily - 7 x weekly - 3 sets - 10 reps - Standing Tandem Balance with Counter Support  - 1 x daily - 7 x weekly - 2-3 reps - 30 sec hold - Ankle and Toe Plantarflexion with Resistance  - 1 x daily - 7 x weekly - 3 sets - 15 reps - black band hold  ASSESSMENT:  CLINICAL IMPRESSION: Pt was able to once again complete all prescribed exercises with no adverse effect. Therapy focused on improving ankle stability, strength, and balance as well as L ankle DF. Pt is continuing to progress with therapy, will continue per POC.   OBJECTIVE IMPAIRMENTS: Abnormal gait, decreased activity tolerance, decreased balance, decreased mobility, difficulty walking, decreased ROM, decreased strength, and pain   ACTIVITY LIMITATIONS: standing, squatting, stairs, transfers, and locomotion level  PARTICIPATION LIMITATIONS: driving, shopping, community activity, occupation, and yard work  PERSONAL FACTORS: 1-2 comorbidities: Previous L foot surgery; Anxiety and Depression  are also affecting patient's functional outcome.   REHAB POTENTIAL: Excellent  CLINICAL DECISION MAKING: Stable/uncomplicated  EVALUATION COMPLEXITY: Low   GOALS: Goals reviewed with patient? No  SHORT TERM GOALS: Target date: 05/27/2023   Pt will be compliant and knowledgeable with initial HEP for improved comfort and carryover Baseline: initial HEP given  Goal status: MET  2.  Pt will self report left  foot pain no greater than 5/10 for improved comfort and functional ability Baseline: 8/10 at worst Goal status: INITIAL   LONG TERM GOALS: Target date: 07/01/2023   Pt will improve FOTO function score to no less than 62% as proxy for functional improvement with home ADLs and community activities Baseline: 43% function Goal status: INITIAL   2.  Pt will self report left foot pain no greater than 1-2/10 for improved comfort and functional ability Baseline: 8/10 at worst Goal status: INITIAL   3.  Pt will improve L SLS time to no less than 30 seconds for improved stability and ankle proprioception for decreasing pain Baseline: 3 seconds Goal status: INITIAL  4.  Pt will improve L ankle DF to no less than 10 degrees past neutral for improving gait and decreasing foot/ankle pain Baseline: 2 degrees Goal status: INITIAL  5.  Pt will improve all L ankle MMT to no less than 4/5 for all tested motions for improving comfort and functional ability with home ADLs and community activities Baseline: see MMT chart Goal status: INITIAL   PLAN:  PT FREQUENCY: 2x/week  PT DURATION: 8 weeks  PLANNED INTERVENTIONS: Therapeutic exercises, Therapeutic activity, Neuromuscular re-education, Balance training, Gait training, Patient/Family education, Self Care, Joint mobilization, Dry Needling, Electrical stimulation, Cryotherapy, Moist heat, Vasopneumatic device, Manual therapy, and Re-evaluation  PLAN FOR NEXT SESSION: assess HEP response, ankle strengthening, balance, ankle ROM   Eloy End, PT 05/30/2023, 10:13 AM

## 2023-06-03 ENCOUNTER — Ambulatory Visit: Payer: Medicaid Other

## 2023-06-03 ENCOUNTER — Ambulatory Visit (INDEPENDENT_AMBULATORY_CARE_PROVIDER_SITE_OTHER): Payer: Medicaid Other | Admitting: Podiatry

## 2023-06-03 ENCOUNTER — Encounter: Payer: Self-pay | Admitting: Podiatry

## 2023-06-03 DIAGNOSIS — M7752 Other enthesopathy of left foot: Secondary | ICD-10-CM

## 2023-06-03 DIAGNOSIS — Z09 Encounter for follow-up examination after completed treatment for conditions other than malignant neoplasm: Secondary | ICD-10-CM

## 2023-06-03 DIAGNOSIS — T8484XA Pain due to internal orthopedic prosthetic devices, implants and grafts, initial encounter: Secondary | ICD-10-CM

## 2023-06-03 NOTE — Progress Notes (Signed)
  Subjective:  Patient ID: Shawn Harper, male    DOB: 09-09-1995,  MRN: [id number]  Chief Complaint  Patient presents with   Routine Post Op    Pt presents for #1 post DOS 03/28/23 pt states he is doing better,  pt has no other complaints.    DOS: 03/28/2023 Procedure: Removal of hardware, excision of talar exostosis  27 y.o. male returns for post-op check.  Still having pain but much better than he was before this most recent surgery, has been able to hike some and be active with his son  Review of Systems: Negative except as noted in the HPI. Denies N/V/F/Ch.   Objective:   There were no vitals filed for this visit.  There is no height or weight on file to calculate BMI. Constitutional Well developed. Well nourished.  Vascular Foot warm and well perfused. Capillary refill normal to all digits.  Calf is soft and supple, no posterior calf or knee pain, negative Homans' sign  Neurologic Normal speech. Oriented to person, place, and time. Epicritic sensation to light touch grossly present bilaterally.  Dermatologic Incisions are well-healed and not hypertrophic  Orthopedic: Mild tenderness around anterior and lateral ankle joint no pain over the talonavicular joint today or subtalar joint or posterior heel   Radiographs taken today show unchanged alignment maintained consolidation across fusion sites, subfibular space still has some bony impingement but much less than previous.  Assessment:   1. Bone spur of left foot   2. Pain due to internal orthopedic prosthetic devices, implants and grafts, initial encounter [facility])    Plan:  Patient was evaluated and treated and all questions answered.  S/p foot surgery left -Overall doing fairly well.  I have no further restrictions for him at this point.  We discussed that conditioning and supportive shoe gear hopefully should continue to alleviate his pain and he can build up strength and stability.  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  He will follow with me in 6 months for new full set of radiographs of the left foot   Return in about 6 months (around 12/02/2023) for surgery f/u with new xrays (L ankle, foot, calc axial).

## 2023-06-10 ENCOUNTER — Ambulatory Visit: Payer: Medicaid Other

## 2023-06-12 ENCOUNTER — Ambulatory Visit: Payer: Medicaid Other | Admitting: Physical Therapy

## 2023-06-17 ENCOUNTER — Ambulatory Visit: Payer: Medicaid Other | Attending: Podiatry | Admitting: Physical Therapy

## 2023-06-17 ENCOUNTER — Encounter: Payer: Self-pay | Admitting: Physical Therapy

## 2023-06-17 DIAGNOSIS — M25572 Pain in left ankle and joints of left foot: Secondary | ICD-10-CM

## 2023-06-17 DIAGNOSIS — R600 Localized edema: Secondary | ICD-10-CM | POA: Diagnosis present

## 2023-06-17 DIAGNOSIS — R2689 Other abnormalities of gait and mobility: Secondary | ICD-10-CM | POA: Insufficient documentation

## 2023-06-17 DIAGNOSIS — M6281 Muscle weakness (generalized): Secondary | ICD-10-CM

## 2023-06-17 NOTE — Therapy (Signed)
OUTPATIENT PHYSICAL THERAPY TREATMENT   Patient Name: Shawn Harper MRN: [id number] DOB:17-Jun-1996, 27 y.o., male Today's Date: 06/17/2023  END OF SESSION:  PT End of Session - 06/17/23 1018     Visit Number 6    Number of Visits 17    Date for PT Re-Evaluation 07/01/23    Authorization Type [place] [facility] [employer]    PT Start Time 1020    PT Stop Time 1100    PT Time Calculation (min) 40 min                Past Medical History:  Diagnosis Date   Anxiety    Depression    Dysrhythmia    Headache    Smoker    5 pack years, trying to quit   Past Surgical History:  Procedure Laterality Date   APPLICATION OF WOUND VAC  03/08/2022   Procedure: APPLICATION OF WOUND VAC;  Surgeon: Edwin Cap, DPM;  Location: [facility];  Service: Podiatry;;   FLAT FOOT RECONSTRUCTION-TAL GASTROC RECESSION Left 03/08/2022   Procedure: FLAT FOOT RECONSTRUCTION WITH FUSION OF JOINTS IN BACK OF FOOT; POSSIBLE CALF MUSCLE LENGTHENING;BONEGRAFT INTO FOOT TO BUILD ARCH; BONEGRAFT FROM LEG;  Surgeon: Edwin Cap, DPM;  Location: [facility];  Service: Podiatry;  Laterality: Left;  POPLITEL AND SAPHENOUS BLOCK   LEG SURGERY     within a month of birth, in cast for infancy   Patient Active Problem List   Diagnosis Date Noted   Chronic pain syndrome 01/08/2023   Complex regional pain syndrome type 2 of left lower extremity 01/08/2023   Leg length discrepancy 01/08/2023   Abnormality of gait and mobility 01/08/2023   Coalition, talocalcaneal    Osteoarthritis of left ankle and foot    Smoker 01/28/2021   Unprotected sex 10/07/2014   GAD (generalized anxiety disorder) 10/07/2014   Depression 10/07/2014   Former smoker     PCP: Shelva Majestic, MD  REFERRING PROVIDER: Edwin Cap, DPM  REFERRING DIAG:  [phone] (ICD-10-CM) - Pain due to internal orthopedic prosthetic devices, implants and grafts, initial encounter (HCC) M77.52 (ICD-10-CM) - Bone spur of left foot  THERAPY DIAG:  Pain in  left ankle and joints of left foot  Muscle weakness (generalized)  Rationale for Evaluation and Treatment: Rehabilitation  ONSET DATE: 03/28/2023  SUBJECTIVE:   SUBJECTIVE STATEMENT: Some days are bad like today 7-8/10. I tried jumping and it was painful. The surgeon told me I couldn't hurt it so I tried it out.   Patient reports that his pain has been at an 8/10 since yesterday, no particular trigger.   PERTINENT HISTORY: Previous L foot surgery; Anxiety and Depression  PAIN:  Are you having pain?  Yes: NPRS scale: 7/10 Worst: 8/10 Pain location: L foot Pain description: sharp, NT Aggravating factors: Standing Relieving factors: Rest, ice  PRECAUTIONS: None  RED FLAGS: None   WEIGHT BEARING RESTRICTIONS: Yes - WBAT  FALLS:  Has patient fallen in last 6 months? No  LIVING ENVIRONMENT: Lives with: lives with their family Lives in: House/apartment  OCCUPATION: Door Comptroller  PLOF: Independent  PATIENT GOALS: decrease foot pain, get back to golfing and recreational activity without pain  NEXT MD VISIT: 05/21/2023  OBJECTIVE:   DIAGNOSTIC FINDINGS: See imaging  PATIENT SURVEYS:  FOTO: 43% function; 62% predicted  COGNITION: Overall cognitive status: Within functional limits for tasks assessed     SENSATION: Light touch: Impaired - L distal foot  POSTURE: No Significant postural  limitations  PALPATION: Warmth noted at surgical site on medial ankle  LOWER EXTREMITY ROM:  Active ROM Right eval Left eval Left  06/17/23  Hip flexion   5  Hip extension   4  Hip abduction   4  Hip adduction     Hip internal rotation     Hip external rotation     Knee flexion     Knee extension     Ankle dorsiflexion WNL 2   Ankle plantarflexion     Ankle inversion     Ankle eversion      (Blank rows = not tested)  LOWER EXTREMITY MMT:  MMT Right eval Left eval Left 05/17/23  Hip flexion     Hip extension     Hip abduction     Hip adduction     Hip  internal rotation     Hip external rotation     Knee flexion     Knee extension     Ankle dorsiflexion WNL 4/5   Ankle plantarflexion WNL 3+/5 Unable to single heel raise  Ankle inversion WNL 2+/5   Ankle eversion WNL 2+/5    (Blank rows = not tested)  LOWER EXTREMITY SPECIAL TESTS:  DNT  FUNCTIONAL TESTS:  SLS: 3 seconds L  GAIT: Distance walked: 76ft Assistive device utilized: None Level of assistance: Complete Independence Comments: slight antalgic gait on L   TREATMENT: [facility] Adult PT Treatment:                                                DATE: 06/17/23 Therapeutic Exercise: Bilat heel raise Box heel raise Gasroc and soleus stretch  DF ROM lunge on 8 inch step Left step up 6 inch Left lateral step up 6 inch  4 inch front step down Left leg pres 40# horizontal SLS trials Tandem stance trials Single leg bridge  x 10 each  90/90 toe taps  Side hip abduction to fatigue bilateral   [facility] Adult PT Treatment:                                                DATE: 05/30/23 Therapeutic Exercise: Towel scrunch x 60"  Marble pickups L foot L ankle PF 25# KB 3x15 L calf stretch 2x45" L ankle DF/Inv/Ev 3x15 RTB L ankle PF 2x15 black band BAPS L3 cw/ccw L 2x15 each Slant board gastroc stretch 2x30" Neuromuscular Re-ed: Kickstand KB cross 2x30" 15# L stance Tandem stance on foam 2x30" L Tandem walk on foam beam x 2 laps Step with high march 8in 2x15 L stance   [facility] Adult PT Treatment:                                                DATE: 05/20/23 Therapeutic Exercise: Rec bike lvl 3 x 5 min while taking subjective Slant board gastroc stretch 2x30" Slant board soleus stretch x30" (painful) Tandem stance 2x30" L Tandem walk on foam beam x 2 laps Sidestepping on foam beam heel hang x2 laps (toe hang too painful) L ankle DF/Inv/Ev 2x15 YTB L ankle PF 2x15  black band L heel raise with 15# KB 2x15 BAPS L2 cw/ccw L x 15 each   [facility] Adult PT Treatment:                                                 DATE: 05/15/2023 Therapeutic Exercise: Rec bike lvl 3 x 3 min while taking subjective Slant board gastroc stretch 2x30" Slant board soleus stretch 2x30" Tandem stance 2x30" L Tandem walk on foam beam x 2 laps L ankle DF/Inv/Ev 2x15 YTB L ankle PF 2x15 black band L heel raise with 15# KB 2x15 BAPS L2 cw/ccw L x 15 each   PATIENT EDUCATION:  Education details: eval findings, FOTO, HEP, POC Person educated: Patient Education method: Explanation, Demonstration, and Handouts Education comprehension: verbalized understanding and returned demonstration  HOME EXERCISE PROGRAM: Access Code: [email or web address] URL: [email or web address] Date: 05/15/2023 Prepared by: Edwinna Areola  Exercises - Gastroc Stretch on Wall  - 1 x daily - 7 x weekly - 2-3 reps - 30 sec hold - Long Sitting Calf Stretch with Strap  - 1 x daily - 7 x weekly - 2 reps - 30 sec hold - Ankle Dorsiflexion with Resistance  - 1 x daily - 7 x weekly - 3 sets - 10 reps - red band hold - Ankle Inversion Eversion Towel Slide  - 1 x daily - 7 x weekly - 3 sets - 10 reps - Standing Tandem Balance with Counter Support  - 1 x daily - 7 x weekly - 2-3 reps - 30 sec hold - Ankle and Toe Plantarflexion with Resistance  - 1 x daily - 7 x weekly - 3 sets - 15 reps - black band hold  ASSESSMENT:  CLINICAL IMPRESSION: Pt was able to once again complete all prescribed exercises with no adverse effect. He saw MD since last visit who told him he could do anything including jumping and running. He jumped 1 x at home and had severe pain which returned to baseline after 2 days. He continues to  have 7-8/10 foot pain as well as posterior and anterior ankle pain.  Therapy focused on improving ankle stability, strength, and balance as well as incorporating core and hip strength for proximal stability.  Pt is continuing to progress with therapy, will continue per POC.   OBJECTIVE IMPAIRMENTS: Abnormal gait,  decreased activity tolerance, decreased balance, decreased mobility, difficulty walking, decreased ROM, decreased strength, and pain   ACTIVITY LIMITATIONS: standing, squatting, stairs, transfers, and locomotion level  PARTICIPATION LIMITATIONS: driving, shopping, community activity, occupation, and yard work  PERSONAL FACTORS: 1-2 comorbidities: Previous L foot surgery; Anxiety and Depression  are also affecting patient's functional outcome.   REHAB POTENTIAL: Excellent  CLINICAL DECISION MAKING: Stable/uncomplicated  EVALUATION COMPLEXITY: Low   GOALS: Goals reviewed with patient? No  SHORT TERM GOALS: Target date: 05/27/2023   Pt will be compliant and knowledgeable with initial HEP for improved comfort and carryover Baseline: initial HEP given  Goal status: MET  2.  Pt will self report left foot pain no greater than 5/10 for improved comfort and functional ability Baseline: 8/10 at worst Goal status: INITIAL   LONG TERM GOALS: Target date: 07/01/2023   Pt will improve FOTO function score to no less than 62% as proxy for functional improvement with home ADLs and community activities Baseline: 43% function Goal status: INITIAL   2.  Pt will self report left foot pain no greater than 1-2/10 for improved comfort and functional ability Baseline: 8/10 at worst Goal status: INITIAL   3.  Pt will improve L SLS time to no less than 30 seconds for improved stability and ankle proprioception for decreasing pain Baseline: 3 seconds Goal status: INITIAL  4.  Pt will improve L ankle DF to no less than 10 degrees past neutral for improving gait and decreasing foot/ankle pain Baseline: 2 degrees Goal status: INITIAL  5.  Pt will improve all L ankle MMT to no less than 4/5 for all tested motions for improving comfort and functional ability with home ADLs and community activities Baseline: see MMT chart Goal status: INITIAL   PLAN:  PT FREQUENCY: 2x/week  PT DURATION: 8  weeks  PLANNED INTERVENTIONS: Therapeutic exercises, Therapeutic activity, Neuromuscular re-education, Balance training, Gait training, Patient/Family education, Self Care, Joint mobilization, Dry Needling, Electrical stimulation, Cryotherapy, Moist heat, Vasopneumatic device, Manual therapy, and Re-evaluation  PLAN FOR NEXT SESSION: assess HEP response, ankle strengthening, balance, ankle ROM   Jannette Spanner, PTA 06/17/23 11:05 AM Phone: [phone] Fax: [phone]

## 2023-06-19 ENCOUNTER — Ambulatory Visit: Payer: Medicaid Other

## 2023-06-19 DIAGNOSIS — M6281 Muscle weakness (generalized): Secondary | ICD-10-CM

## 2023-06-19 DIAGNOSIS — R600 Localized edema: Secondary | ICD-10-CM

## 2023-06-19 DIAGNOSIS — M25572 Pain in left ankle and joints of left foot: Secondary | ICD-10-CM | POA: Diagnosis not present

## 2023-06-19 DIAGNOSIS — R2689 Other abnormalities of gait and mobility: Secondary | ICD-10-CM

## 2023-06-19 NOTE — Therapy (Signed)
OUTPATIENT PHYSICAL THERAPY TREATMENT   Patient Name: Shawn Harper MRN: [id number] DOB:Dec 20, 1995, 27 y.o., male Today's Date: 06/19/2023  END OF SESSION:  PT End of Session - 06/19/23 1018     Visit Number 7    Number of Visits 17    Date for PT Re-Evaluation 07/01/23    Authorization Type [place] [facility] [employer]    PT Start Time 1018    PT Stop Time 1056    PT Time Calculation (min) 38 min                 Past Medical History:  Diagnosis Date   Anxiety    Depression    Dysrhythmia    Headache    Smoker    5 pack years, trying to quit   Past Surgical History:  Procedure Laterality Date   APPLICATION OF WOUND VAC  03/08/2022   Procedure: APPLICATION OF WOUND VAC;  Surgeon: Edwin Cap, DPM;  Location: [facility];  Service: Podiatry;;   FLAT FOOT RECONSTRUCTION-TAL GASTROC RECESSION Left 03/08/2022   Procedure: FLAT FOOT RECONSTRUCTION WITH FUSION OF JOINTS IN BACK OF FOOT; POSSIBLE CALF MUSCLE LENGTHENING;BONEGRAFT INTO FOOT TO BUILD ARCH; BONEGRAFT FROM LEG;  Surgeon: Edwin Cap, DPM;  Location: [facility];  Service: Podiatry;  Laterality: Left;  POPLITEL AND SAPHENOUS BLOCK   LEG SURGERY     within a month of birth, in cast for infancy   Patient Active Problem List   Diagnosis Date Noted   Chronic pain syndrome 01/08/2023   Complex regional pain syndrome type 2 of left lower extremity 01/08/2023   Leg length discrepancy 01/08/2023   Abnormality of gait and mobility 01/08/2023   Coalition, talocalcaneal    Osteoarthritis of left ankle and foot    Smoker 01/28/2021   Unprotected sex 10/07/2014   GAD (generalized anxiety disorder) 10/07/2014   Depression 10/07/2014   Former smoker     PCP: Shelva Majestic, MD  REFERRING PROVIDER: Edwin Cap, DPM  REFERRING DIAG:  [phone] (ICD-10-CM) - Pain due to internal orthopedic prosthetic devices, implants and grafts, initial encounter (HCC) M77.52 (ICD-10-CM) - Bone spur of left foot  THERAPY DIAG:  Pain in  left ankle and joints of left foot  Muscle weakness (generalized)  Other abnormalities of gait and mobility  Localized edema  Rationale for Evaluation and Treatment: Rehabilitation  ONSET DATE: 03/28/2023  SUBJECTIVE:   SUBJECTIVE STATEMENT: Pt presents to PT with reports of continued significant discomfort in L foot. Has tried to remain compliant with HEP.   PERTINENT HISTORY: Previous L foot surgery; Anxiety and Depression  PAIN:  Are you having pain?  Yes: NPRS scale: 7/10 Worst: 8/10 Pain location: L foot Pain description: sharp, NT Aggravating factors: Standing Relieving factors: Rest, ice  PRECAUTIONS: None  RED FLAGS: None   WEIGHT BEARING RESTRICTIONS: Yes - WBAT  FALLS:  Has patient fallen in last 6 months? No  LIVING ENVIRONMENT: Lives with: lives with their family Lives in: House/apartment  OCCUPATION: Door Comptroller  PLOF: Independent  PATIENT GOALS: decrease foot pain, get back to golfing and recreational activity without pain  NEXT MD VISIT: 05/21/2023  OBJECTIVE:   DIAGNOSTIC FINDINGS: See imaging  PATIENT SURVEYS:  FOTO: 43% function; 62% predicted  COGNITION: Overall cognitive status: Within functional limits for tasks assessed     SENSATION: Light touch: Impaired - L distal foot  POSTURE: No Significant postural limitations  PALPATION: Warmth noted at surgical site on medial ankle  LOWER  EXTREMITY ROM:  Active ROM Right eval Left eval Left  06/17/23  Hip flexion   5  Hip extension   4  Hip abduction   4  Hip adduction     Hip internal rotation     Hip external rotation     Knee flexion     Knee extension     Ankle dorsiflexion WNL 2   Ankle plantarflexion     Ankle inversion     Ankle eversion      (Blank rows = not tested)  LOWER EXTREMITY MMT:  MMT Right eval Left eval Left 05/17/23  Hip flexion     Hip extension     Hip abduction     Hip adduction     Hip internal rotation     Hip external rotation      Knee flexion     Knee extension     Ankle dorsiflexion WNL 4/5   Ankle plantarflexion WNL 3+/5 Unable to single heel raise  Ankle inversion WNL 2+/5   Ankle eversion WNL 2+/5    (Blank rows = not tested)  LOWER EXTREMITY SPECIAL TESTS:  DNT  FUNCTIONAL TESTS:  SLS: 3 seconds L  GAIT: Distance walked: 69ft Assistive device utilized: None Level of assistance: Complete Independence Comments: slight antalgic gait on L   TREATMENT: [facility] Adult PT Treatment:                                                DATE: 06/19/23 Therapeutic Exercise: NuStep lvl 6 LE only x 3 min while taking subjective Slant board calf stretch 2x30"  Wobble board fwd/bwd 2x30" Standing heel raise 2x20 Eccentric L heel lowering x 10 4in Tandem on foam 2x30" Single leg press 2x10 65# L ankle inv/ev 2x10 GTB Seated L heel raise 2x15 15# L single leg bridge 2x10  [facility] Adult PT Treatment:                                                DATE: 06/17/23 Therapeutic Exercise: Bilat heel raise Box heel raise Gasroc and soleus stretch  DF ROM lunge on 8 inch step Left step up 6 inch Left lateral step up 6 inch  4 inch front step down Left leg pres 40# horizontal SLS trials Tandem stance trials Single leg bridge  x 10 each  90/90 toe taps  Side hip abduction to fatigue bilateral   [facility] Adult PT Treatment:                                                DATE: 05/30/23 Therapeutic Exercise: Towel scrunch x 60"  Marble pickups L foot L ankle PF 25# KB 3x15 L calf stretch 2x45" L ankle DF/Inv/Ev 3x15 RTB L ankle PF 2x15 black band BAPS L3 cw/ccw L 2x15 each Slant board gastroc stretch 2x30" Neuromuscular Re-ed: Kickstand KB cross 2x30" 15# L stance Tandem stance on foam 2x30" L Tandem walk on foam beam x 2 laps Step with high march 8in 2x15 L stance   [facility] Adult PT Treatment:  DATE: 05/20/23 Therapeutic Exercise: Rec bike lvl 3 x 5 min while taking  subjective Slant board gastroc stretch 2x30" Slant board soleus stretch x30" (painful) Tandem stance 2x30" L Tandem walk on foam beam x 2 laps Sidestepping on foam beam heel hang x2 laps (toe hang too painful) L ankle DF/Inv/Ev 2x15 YTB L ankle PF 2x15 black band L heel raise with 15# KB 2x15 BAPS L2 cw/ccw L x 15 each   [facility] Adult PT Treatment:                                                DATE: 05/15/2023 Therapeutic Exercise: Rec bike lvl 3 x 3 min while taking subjective Slant board gastroc stretch 2x30" Slant board soleus stretch 2x30" Tandem stance 2x30" L Tandem walk on foam beam x 2 laps L ankle DF/Inv/Ev 2x15 YTB L ankle PF 2x15 black band L heel raise with 15# KB 2x15 BAPS L2 cw/ccw L x 15 each   PATIENT EDUCATION:  Education details: eval findings, FOTO, HEP, POC Person educated: Patient Education method: Explanation, Demonstration, and Handouts Education comprehension: verbalized understanding and returned demonstration  HOME EXERCISE PROGRAM: Access Code: [email or web address] URL: [email or web address] Date: 05/15/2023 Prepared by: Edwinna Areola  Exercises - Gastroc Stretch on Wall  - 1 x daily - 7 x weekly - 2-3 reps - 30 sec hold - Long Sitting Calf Stretch with Strap  - 1 x daily - 7 x weekly - 2 reps - 30 sec hold - Ankle Dorsiflexion with Resistance  - 1 x daily - 7 x weekly - 3 sets - 10 reps - red band hold - Ankle Inversion Eversion Towel Slide  - 1 x daily - 7 x weekly - 3 sets - 10 reps - Standing Tandem Balance with Counter Support  - 1 x daily - 7 x weekly - 2-3 reps - 30 sec hold - Ankle and Toe Plantarflexion with Resistance  - 1 x daily - 7 x weekly - 3 sets - 15 reps - black band hold  ASSESSMENT:  CLINICAL IMPRESSION: Pt was able to once again complete all prescribed exercises with no adverse effect. Therapy focused on improving ankle stability, strength, and balance as well as L ankle DF. Pt still with distal L ankle motor control  deficits, especially into eversion. Did notice decreased pain post session, especially in L achilles. Pt is continuing to progress with therapy, will continue per POC.   OBJECTIVE IMPAIRMENTS: Abnormal gait, decreased activity tolerance, decreased balance, decreased mobility, difficulty walking, decreased ROM, decreased strength, and pain   ACTIVITY LIMITATIONS: standing, squatting, stairs, transfers, and locomotion level  PARTICIPATION LIMITATIONS: driving, shopping, community activity, occupation, and yard work  PERSONAL FACTORS: 1-2 comorbidities: Previous L foot surgery; Anxiety and Depression  are also affecting patient's functional outcome.   REHAB POTENTIAL: Excellent  CLINICAL DECISION MAKING: Stable/uncomplicated  EVALUATION COMPLEXITY: Low   GOALS: Goals reviewed with patient? No  SHORT TERM GOALS: Target date: 05/27/2023   Pt will be compliant and knowledgeable with initial HEP for improved comfort and carryover Baseline: initial HEP given  Goal status: MET  2.  Pt will self report left foot pain no greater than 5/10 for improved comfort and functional ability Baseline: 8/10 at worst Goal status: INITIAL   LONG TERM GOALS: Target date: 07/01/2023   Pt will improve FOTO function score to no  less than 62% as proxy for functional improvement with home ADLs and community activities Baseline: 43% function Goal status: INITIAL   2.  Pt will self report left foot pain no greater than 1-2/10 for improved comfort and functional ability Baseline: 8/10 at worst Goal status: INITIAL   3.  Pt will improve L SLS time to no less than 30 seconds for improved stability and ankle proprioception for decreasing pain Baseline: 3 seconds Goal status: INITIAL  4.  Pt will improve L ankle DF to no less than 10 degrees past neutral for improving gait and decreasing foot/ankle pain Baseline: 2 degrees Goal status: INITIAL  5.  Pt will improve all L ankle MMT to no less than 4/5 for  all tested motions for improving comfort and functional ability with home ADLs and community activities Baseline: see MMT chart Goal status: INITIAL   PLAN:  PT FREQUENCY: 2x/week  PT DURATION: 8 weeks  PLANNED INTERVENTIONS: Therapeutic exercises, Therapeutic activity, Neuromuscular re-education, Balance training, Gait training, Patient/Family education, Self Care, Joint mobilization, Dry Needling, Electrical stimulation, Cryotherapy, Moist heat, Vasopneumatic device, Manual therapy, and Re-evaluation  PLAN FOR NEXT SESSION: assess HEP response, ankle strengthening, balance, ankle ROM   Eloy End PT  06/19/23 10:57 AM

## 2023-06-24 ENCOUNTER — Ambulatory Visit: Payer: Medicaid Other

## 2023-06-24 DIAGNOSIS — M25572 Pain in left ankle and joints of left foot: Secondary | ICD-10-CM

## 2023-06-24 DIAGNOSIS — M6281 Muscle weakness (generalized): Secondary | ICD-10-CM

## 2023-06-24 NOTE — Therapy (Addendum)
OUTPATIENT PHYSICAL THERAPY TREATMENT   Patient Name: Shawn Harper MRN: [id number] DOB:[birth year], 27 y.o., male Today's Date: 06/24/2023  END OF SESSION:  PT End of Session - 06/24/23 0839     Visit Number 8    Number of Visits 17    Date for PT Re-Evaluation 07/01/23    Authorization Type [place] [facility] [employer]    PT Start Time 0842    PT Stop Time 0922    PT Time Calculation (min) 40 min                  Past Medical History:  Diagnosis Date   Anxiety    Depression    Dysrhythmia    Headache    Smoker    5 pack years, trying to quit   Past Surgical History:  Procedure Laterality Date   APPLICATION OF WOUND VAC  03/08/2022   Procedure: APPLICATION OF WOUND VAC;  Surgeon: Edwin Cap, DPM;  Location: [facility];  Service: Podiatry;;   FLAT FOOT RECONSTRUCTION-TAL GASTROC RECESSION Left 03/08/2022   Procedure: FLAT FOOT RECONSTRUCTION WITH FUSION OF JOINTS IN BACK OF FOOT; POSSIBLE CALF MUSCLE LENGTHENING;BONEGRAFT INTO FOOT TO BUILD ARCH; BONEGRAFT FROM LEG;  Surgeon: Edwin Cap, DPM;  Location: [facility];  Service: Podiatry;  Laterality: Left;  POPLITEL AND SAPHENOUS BLOCK   LEG SURGERY     within a month of birth, in cast for infancy   Patient Active Problem List   Diagnosis Date Noted   Chronic pain syndrome 01/08/2023   Complex regional pain syndrome type 2 of left lower extremity 01/08/2023   Leg length discrepancy 01/08/2023   Abnormality of gait and mobility 01/08/2023   Coalition, talocalcaneal    Osteoarthritis of left ankle and foot    Smoker 01/28/2021   Unprotected sex 10/07/2014   GAD (generalized anxiety disorder) 10/07/2014   Depression 10/07/2014   Former smoker     PCP: Shelva Majestic, MD  REFERRING PROVIDER: Edwin Cap, DPM  REFERRING DIAG:  3314609099 (ICD-10-CM) - Pain due to internal orthopedic prosthetic devices, implants and grafts, initial encounter (HCC) M77.52 (ICD-10-CM) - Bone spur of left foot  THERAPY DIAG:  Pain  in left ankle and joints of left foot  Muscle weakness (generalized)  Rationale for Evaluation and Treatment: Rehabilitation  ONSET DATE: 03/28/2023  SUBJECTIVE:   SUBJECTIVE STATEMENT: Pt presents to PT with reports of decreased pain in L foot today. Has been compliant with HEP.  PERTINENT HISTORY: Previous L foot surgery; Anxiety and Depression  PAIN:  Are you having pain?  Yes: NPRS scale: 2/10 Worst: 8/10 Pain location: L foot Pain description: sharp, NT Aggravating factors: Standing Relieving factors: Rest, ice  PRECAUTIONS: None  RED FLAGS: None   WEIGHT BEARING RESTRICTIONS: Yes - WBAT  FALLS:  Has patient fallen in last 6 months? No  LIVING ENVIRONMENT: Lives with: lives with their family Lives in: House/apartment  OCCUPATION: Door Comptroller  PLOF: Independent  PATIENT GOALS: decrease foot pain, get back to golfing and recreational activity without pain  NEXT MD VISIT: 05/21/2023  OBJECTIVE:   DIAGNOSTIC FINDINGS: See imaging  PATIENT SURVEYS:  FOTO: 43% function; 62% predicted  COGNITION: Overall cognitive status: Within functional limits for tasks assessed     SENSATION: Light touch: Impaired - L distal foot  POSTURE: No Significant postural limitations  PALPATION: Warmth noted at surgical site on medial ankle  LOWER EXTREMITY ROM:  Active ROM Right eval Left eval Left  06/17/23  Hip flexion   5  Hip extension   4  Hip abduction   4  Hip adduction     Hip internal rotation     Hip external rotation     Knee flexion     Knee extension     Ankle dorsiflexion WNL 2   Ankle plantarflexion     Ankle inversion     Ankle eversion      (Blank rows = not tested)  LOWER EXTREMITY MMT:  MMT Right eval Left eval Left 05/17/23  Hip flexion     Hip extension     Hip abduction     Hip adduction     Hip internal rotation     Hip external rotation     Knee flexion     Knee extension     Ankle dorsiflexion WNL 4/5   Ankle  plantarflexion WNL 3+/5 Unable to single heel raise  Ankle inversion WNL 2+/5   Ankle eversion WNL 2+/5    (Blank rows = not tested)  LOWER EXTREMITY SPECIAL TESTS:  DNT  FUNCTIONAL TESTS:  SLS: 3 seconds L  GAIT: Distance walked: 68ft Assistive device utilized: None Level of assistance: Complete Independence Comments: slight antalgic gait on L   TREATMENT: [facility] Adult PT Treatment:                                                DATE: 06/24/23 Therapeutic Exercise: NuStep lvl 6 LE only x 3 min while taking subjective Slant board calf stretch 2x45"  Wobble board fwd/bwd 2x30" Standing heel raise 2x20 Eccentric L heel lowering 2x10 4in Tandem on foam 2x30" Kickstand KB cross 10# 2x30" Step up with 10# KB in ea hand 2x10 fwd with march 8in step Single leg press 3x10 65# L ankle inv/ev 2x15 GTB L single leg bridge 2x10 each  [facility] Adult PT Treatment:                                                DATE: 06/19/23 Therapeutic Exercise: NuStep lvl 6 LE only x 3 min while taking subjective Slant board calf stretch 2x30"  Wobble board fwd/bwd 2x30" Standing heel raise 2x20 Eccentric L heel lowering x 10 4in Tandem on foam 2x30" Single leg press 2x10 65# L ankle inv/ev 2x10 GTB Seated L heel raise 2x15 15# L single leg bridge 2x10  [facility] Adult PT Treatment:                                                DATE: 06/17/23 Therapeutic Exercise: Bilat heel raise Box heel raise Gasroc and soleus stretch  DF ROM lunge on 8 inch step Left step up 6 inch Left lateral step up 6 inch  4 inch front step down Left leg pres 40# horizontal SLS trials Tandem stance trials Single leg bridge  x 10 each  90/90 toe taps  Side hip abduction to fatigue bilateral   [facility] Adult PT Treatment:  DATE: 05/30/23 Therapeutic Exercise: Towel scrunch x 60"  Marble pickups L foot L ankle PF 25# KB 3x15 L calf stretch 2x45" L ankle DF/Inv/Ev 3x15 RTB L  ankle PF 2x15 black band BAPS L3 cw/ccw L 2x15 each Slant board gastroc stretch 2x30" Neuromuscular Re-ed: Kickstand KB cross 2x30" 15# L stance Tandem stance on foam 2x30" L Tandem walk on foam beam x 2 laps Step with high march 8in 2x15 L stance   [facility] Adult PT Treatment:                                                DATE: 05/20/23 Therapeutic Exercise: Rec bike lvl 3 x 5 min while taking subjective Slant board gastroc stretch 2x30" Slant board soleus stretch x30" (painful) Tandem stance 2x30" L Tandem walk on foam beam x 2 laps Sidestepping on foam beam heel hang x2 laps (toe hang too painful) L ankle DF/Inv/Ev 2x15 YTB L ankle PF 2x15 black band L heel raise with 15# KB 2x15 BAPS L2 cw/ccw L x 15 each   [facility] Adult PT Treatment:                                                DATE: 05/15/2023 Therapeutic Exercise: Rec bike lvl 3 x 3 min while taking subjective Slant board gastroc stretch 2x30" Slant board soleus stretch 2x30" Tandem stance 2x30" L Tandem walk on foam beam x 2 laps L ankle DF/Inv/Ev 2x15 YTB L ankle PF 2x15 black band L heel raise with 15# KB 2x15 BAPS L2 cw/ccw L x 15 each   PATIENT EDUCATION:  Education details: eval findings, FOTO, HEP, POC Person educated: Patient Education method: Explanation, Demonstration, and Handouts Education comprehension: verbalized understanding and returned demonstration  HOME EXERCISE PROGRAM: Access Code: [email or web address] URL: [email or web address] Date: 05/15/2023 Prepared by: Edwinna Areola  Exercises - Gastroc Stretch on Wall  - 1 x daily - 7 x weekly - 2-3 reps - 30 sec hold - Long Sitting Calf Stretch with Strap  - 1 x daily - 7 x weekly - 2 reps - 30 sec hold - Ankle Dorsiflexion with Resistance  - 1 x daily - 7 x weekly - 3 sets - 10 reps - red band hold - Ankle Inversion Eversion Towel Slide  - 1 x daily - 7 x weekly - 3 sets - 10 reps - Standing Tandem Balance with Counter Support  - 1 x daily - 7 x  weekly - 2-3 reps - 30 sec hold - Ankle and Toe Plantarflexion with Resistance  - 1 x daily - 7 x weekly - 3 sets - 15 reps - black band hold  ASSESSMENT:  CLINICAL IMPRESSION: Pt was able to once again complete all prescribed exercises with no adverse effect. Therapy focused on improving ankle stability, strength, and balance as well as L ankle DF. Pt still with distal L ankle motor control deficits, especially into eversion. Did notice decreased pain post session, especially in L achilles. Pt is continuing to progress with therapy, will continue per POC.   OBJECTIVE IMPAIRMENTS: Abnormal gait, decreased activity tolerance, decreased balance, decreased mobility, difficulty walking, decreased ROM, decreased strength, and pain   ACTIVITY LIMITATIONS: standing, squatting, stairs, transfers, and locomotion  level  PARTICIPATION LIMITATIONS: driving, shopping, community activity, occupation, and yard work  PERSONAL FACTORS: 1-2 comorbidities: Previous L foot surgery; Anxiety and Depression  are also affecting patient's functional outcome.   REHAB POTENTIAL: Excellent  CLINICAL DECISION MAKING: Stable/uncomplicated  EVALUATION COMPLEXITY: Low   GOALS: Goals reviewed with patient? No  SHORT TERM GOALS: Target date: 05/27/2023   Pt will be compliant and knowledgeable with initial HEP for improved comfort and carryover Baseline: initial HEP given  Goal status: MET  2.  Pt will self report left foot pain no greater than 5/10 for improved comfort and functional ability Baseline: 8/10 at worst Goal status: INITIAL   LONG TERM GOALS: Target date: 07/01/2023   Pt will improve FOTO function score to no less than 62% as proxy for functional improvement with home ADLs and community activities Baseline: 43% function Goal status: INITIAL   2.  Pt will self report left foot pain no greater than 1-2/10 for improved comfort and functional ability Baseline: 8/10 at worst Goal status: INITIAL    3.  Pt will improve L SLS time to no less than 30 seconds for improved stability and ankle proprioception for decreasing pain Baseline: 3 seconds Goal status: INITIAL  4.  Pt will improve L ankle DF to no less than 10 degrees past neutral for improving gait and decreasing foot/ankle pain Baseline: 2 degrees Goal status: INITIAL  5.  Pt will improve all L ankle MMT to no less than 4/5 for all tested motions for improving comfort and functional ability with home ADLs and community activities Baseline: see MMT chart Goal status: INITIAL   PLAN:  PT FREQUENCY: 2x/week  PT DURATION: 8 weeks  PLANNED INTERVENTIONS: Therapeutic exercises, Therapeutic activity, Neuromuscular re-education, Balance training, Gait training, Patient/Family education, Self Care, Joint mobilization, Dry Needling, Electrical stimulation, Cryotherapy, Moist heat, Vasopneumatic device, Manual therapy, and Re-evaluation  PLAN FOR NEXT SESSION: assess HEP response, ankle strengthening, balance, ankle ROM   Eloy End PT  06/24/23 9:23 AM

## 2023-06-26 ENCOUNTER — Ambulatory Visit: Payer: Medicaid Other

## 2023-06-26 DIAGNOSIS — M25572 Pain in left ankle and joints of left foot: Secondary | ICD-10-CM

## 2023-06-26 DIAGNOSIS — M6281 Muscle weakness (generalized): Secondary | ICD-10-CM

## 2023-06-26 NOTE — Therapy (Signed)
OUTPATIENT PHYSICAL THERAPY TREATMENT   Patient Name: Shawn Harper MRN: [id number] DOB:[birth year], 27 y.o., male Today's Date: 06/26/2023  END OF SESSION:  PT End of Session - 06/26/23 1021     Visit Number 9    Number of Visits 17    Date for PT Re-Evaluation 07/01/23    Authorization Type [place] [facility] [employer]    PT Start Time 1021   arrived late   PT Stop Time 1059    PT Time Calculation (min) 38 min                  Past Medical History:  Diagnosis Date   Anxiety    Depression    Dysrhythmia    Headache    Smoker    5 pack years, trying to quit   Past Surgical History:  Procedure Laterality Date   APPLICATION OF WOUND VAC  03/08/2022   Procedure: APPLICATION OF WOUND VAC;  Surgeon: Edwin Cap, DPM;  Location: [facility];  Service: Podiatry;;   FLAT FOOT RECONSTRUCTION-TAL GASTROC RECESSION Left 03/08/2022   Procedure: FLAT FOOT RECONSTRUCTION WITH FUSION OF JOINTS IN BACK OF FOOT; POSSIBLE CALF MUSCLE LENGTHENING;BONEGRAFT INTO FOOT TO BUILD ARCH; BONEGRAFT FROM LEG;  Surgeon: Edwin Cap, DPM;  Location: [facility];  Service: Podiatry;  Laterality: Left;  POPLITEL AND SAPHENOUS BLOCK   LEG SURGERY     within a month of birth, in cast for infancy   Patient Active Problem List   Diagnosis Date Noted   Chronic pain syndrome 01/08/2023   Complex regional pain syndrome type 2 of left lower extremity 01/08/2023   Leg length discrepancy 01/08/2023   Abnormality of gait and mobility 01/08/2023   Coalition, talocalcaneal    Osteoarthritis of left ankle and foot    Smoker 01/28/2021   Unprotected sex 10/07/2014   GAD (generalized anxiety disorder) 10/07/2014   Depression 10/07/2014   Former smoker     PCP: Shelva Majestic, MD  REFERRING PROVIDER: Edwin Cap, DPM  REFERRING DIAG:  [id number] (ICD-10-CM) - Pain due to internal orthopedic prosthetic devices, implants and grafts, initial encounter (HCC) M77.52 (ICD-10-CM) - Bone spur of left  foot  THERAPY DIAG:  Pain in left ankle and joints of left foot  Muscle weakness (generalized)  Rationale for Evaluation and Treatment: Rehabilitation  ONSET DATE: 03/28/2023  SUBJECTIVE:   SUBJECTIVE STATEMENT: Pt presents to PT with reports of pain in L foot today after golfing. Has been compliant with HEP.  PERTINENT HISTORY: Previous L foot surgery; Anxiety and Depression  PAIN:  Are you having pain?  Yes: NPRS scale: 4/10 Worst: 8/10 Pain location: L foot Pain description: sharp, NT Aggravating factors: Standing Relieving factors: Rest, ice  PRECAUTIONS: None  RED FLAGS: None   WEIGHT BEARING RESTRICTIONS: Yes - WBAT  FALLS:  Has patient fallen in last 6 months? No  LIVING ENVIRONMENT: Lives with: lives with their family Lives in: House/apartment  OCCUPATION: Door Comptroller  PLOF: Independent  PATIENT GOALS: decrease foot pain, get back to golfing and recreational activity without pain  NEXT MD VISIT: 05/21/2023  OBJECTIVE:   DIAGNOSTIC FINDINGS: See imaging  PATIENT SURVEYS:  FOTO: 43% function; 62% predicted  COGNITION: Overall cognitive status: Within functional limits for tasks assessed     SENSATION: Light touch: Impaired - L distal foot  POSTURE: No Significant postural limitations  PALPATION: Warmth noted at surgical site on medial ankle  LOWER EXTREMITY ROM:  Active ROM Right eval Left  eval Left  06/17/23  Hip flexion   5  Hip extension   4  Hip abduction   4  Hip adduction     Hip internal rotation     Hip external rotation     Knee flexion     Knee extension     Ankle dorsiflexion WNL 2   Ankle plantarflexion     Ankle inversion     Ankle eversion      (Blank rows = not tested)  LOWER EXTREMITY MMT:  MMT Right eval Left eval Left 05/17/23  Hip flexion     Hip extension     Hip abduction     Hip adduction     Hip internal rotation     Hip external rotation     Knee flexion     Knee extension     Ankle  dorsiflexion WNL 4/5   Ankle plantarflexion WNL 3+/5 Unable to single heel raise  Ankle inversion WNL 2+/5   Ankle eversion WNL 2+/5    (Blank rows = not tested)  LOWER EXTREMITY SPECIAL TESTS:  DNT  FUNCTIONAL TESTS:  SLS: 3 seconds L  GAIT: Distance walked: 64ft Assistive device utilized: None Level of assistance: Complete Independence Comments: slight antalgic gait on L   TREATMENT: [facility] Adult PT Treatment:                                                DATE: 06/26/23 Therapeutic Exercise: Rec bike lvl 3 x 3 min while taking subjective Slant board calf stretch 2x45"  Wobble board fwd/bwd 2x45" Standing heel raise 2x20 Eccentric L heel lowering 2x10 4in Tandem on foam 2x30" Kickstand KB cross 10# 2x30" Tandem walk on foam beam x 4 laps Step up with 10# KB in ea hand 2x10 fwd with march 8in step Single leg press 3x10 70# Standng hip abd 2x10 37.5# TRX squat 2x10  [facility] Adult PT Treatment:                                                DATE: 06/24/23 Therapeutic Exercise: NuStep lvl 6 LE only x 3 min while taking subjective Slant board calf stretch 2x45"  Wobble board fwd/bwd 2x30" Standing heel raise 2x20 Eccentric L heel lowering 2x10 4in Tandem on foam 2x30" Kickstand KB cross 10# 2x30" Step up with 10# KB in ea hand 2x10 fwd with march 8in step Single leg press 3x10 65# L ankle inv/ev 2x15 GTB L single leg bridge 2x10 each  [facility] Adult PT Treatment:                                                DATE: 06/19/23 Therapeutic Exercise: NuStep lvl 6 LE only x 3 min while taking subjective Slant board calf stretch 2x30"  Wobble board fwd/bwd 2x30" Standing heel raise 2x20 Eccentric L heel lowering x 10 4in Tandem on foam 2x30" Single leg press 2x10 65# L ankle inv/ev 2x10 GTB Seated L heel raise 2x15 15# L single leg bridge 2x10  [facility] Adult PT Treatment:  DATE: 06/17/23 Therapeutic Exercise: Bilat heel raise Box  heel raise Gasroc and soleus stretch  DF ROM lunge on 8 inch step Left step up 6 inch Left lateral step up 6 inch  4 inch front step down Left leg pres 40# horizontal SLS trials Tandem stance trials Single leg bridge  x 10 each  90/90 toe taps  Side hip abduction to fatigue bilateral   [facility] Adult PT Treatment:                                                DATE: 05/30/23 Therapeutic Exercise: Towel scrunch x 60"  Marble pickups L foot L ankle PF 25# KB 3x15 L calf stretch 2x45" L ankle DF/Inv/Ev 3x15 RTB L ankle PF 2x15 black band BAPS L3 cw/ccw L 2x15 each Slant board gastroc stretch 2x30" Neuromuscular Re-ed: Kickstand KB cross 2x30" 15# L stance Tandem stance on foam 2x30" L Tandem walk on foam beam x 2 laps Step with high march 8in 2x15 L stance   [facility] Adult PT Treatment:                                                DATE: 05/20/23 Therapeutic Exercise: Rec bike lvl 3 x 5 min while taking subjective Slant board gastroc stretch 2x30" Slant board soleus stretch x30" (painful) Tandem stance 2x30" L Tandem walk on foam beam x 2 laps Sidestepping on foam beam heel hang x2 laps (toe hang too painful) L ankle DF/Inv/Ev 2x15 YTB L ankle PF 2x15 black band L heel raise with 15# KB 2x15 BAPS L2 cw/ccw L x 15 each   [facility] Adult PT Treatment:                                                DATE: 05/15/2023 Therapeutic Exercise: Rec bike lvl 3 x 3 min while taking subjective Slant board gastroc stretch 2x30" Slant board soleus stretch 2x30" Tandem stance 2x30" L Tandem walk on foam beam x 2 laps L ankle DF/Inv/Ev 2x15 YTB L ankle PF 2x15 black band L heel raise with 15# KB 2x15 BAPS L2 cw/ccw L x 15 each   PATIENT EDUCATION:  Education details: eval findings, FOTO, HEP, POC Person educated: Patient Education method: Explanation, Demonstration, and Handouts Education comprehension: verbalized understanding and returned demonstration  HOME EXERCISE PROGRAM: Access  Code: [email or web address] URL: [email or web address] Date: 05/15/2023 Prepared by: Edwinna Areola  Exercises - Gastroc Stretch on Wall  - 1 x daily - 7 x weekly - 2-3 reps - 30 sec hold - Long Sitting Calf Stretch with Strap  - 1 x daily - 7 x weekly - 2 reps - 30 sec hold - Ankle Dorsiflexion with Resistance  - 1 x daily - 7 x weekly - 3 sets - 10 reps - red band hold - Ankle Inversion Eversion Towel Slide  - 1 x daily - 7 x weekly - 3 sets - 10 reps - Standing Tandem Balance with Counter Support  - 1 x daily - 7 x weekly - 2-3 reps - 30 sec hold - Ankle and Toe  Plantarflexion with Resistance  - 1 x daily - 7 x weekly - 3 sets - 15 reps - black band hold  ASSESSMENT:  CLINICAL IMPRESSION: Pt was able to once again complete all prescribed exercises with no adverse effect. Therapy focused on improving ankle stability, strength, and balance as well as L ankle DF. Pt still with distal L ankle motor control deficits, especially into eversion but is improving slowly. Pt is continuing to progress with therapy, will continue per POC.   OBJECTIVE IMPAIRMENTS: Abnormal gait, decreased activity tolerance, decreased balance, decreased mobility, difficulty walking, decreased ROM, decreased strength, and pain   ACTIVITY LIMITATIONS: standing, squatting, stairs, transfers, and locomotion level  PARTICIPATION LIMITATIONS: driving, shopping, community activity, occupation, and yard work  PERSONAL FACTORS: 1-2 comorbidities: Previous L foot surgery; Anxiety and Depression  are also affecting patient's functional outcome.   REHAB POTENTIAL: Excellent  CLINICAL DECISION MAKING: Stable/uncomplicated  EVALUATION COMPLEXITY: Low   GOALS: Goals reviewed with patient? No  SHORT TERM GOALS: Target date: 05/27/2023   Pt will be compliant and knowledgeable with initial HEP for improved comfort and carryover Baseline: initial HEP given  Goal status: MET  2.  Pt will self report left foot pain no  greater than 5/10 for improved comfort and functional ability Baseline: 8/10 at worst Goal status: INITIAL   LONG TERM GOALS: Target date: 07/01/2023   Pt will improve FOTO function score to no less than 62% as proxy for functional improvement with home ADLs and community activities Baseline: 43% function Goal status: INITIAL   2.  Pt will self report left foot pain no greater than 1-2/10 for improved comfort and functional ability Baseline: 8/10 at worst Goal status: INITIAL   3.  Pt will improve L SLS time to no less than 30 seconds for improved stability and ankle proprioception for decreasing pain Baseline: 3 seconds Goal status: INITIAL  4.  Pt will improve L ankle DF to no less than 10 degrees past neutral for improving gait and decreasing foot/ankle pain Baseline: 2 degrees Goal status: INITIAL  5.  Pt will improve all L ankle MMT to no less than 4/5 for all tested motions for improving comfort and functional ability with home ADLs and community activities Baseline: see MMT chart Goal status: INITIAL   PLAN:  PT FREQUENCY: 2x/week  PT DURATION: 8 weeks  PLANNED INTERVENTIONS: Therapeutic exercises, Therapeutic activity, Neuromuscular re-education, Balance training, Gait training, Patient/Family education, Self Care, Joint mobilization, Dry Needling, Electrical stimulation, Cryotherapy, Moist heat, Vasopneumatic device, Manual therapy, and Re-evaluation  PLAN FOR NEXT SESSION: assess HEP response, ankle strengthening, balance, ankle ROM   Eloy End PT  06/26/23 11:00 AM

## 2023-06-30 ENCOUNTER — Ambulatory Visit: Payer: Medicaid Other

## 2023-06-30 ENCOUNTER — Telehealth: Payer: Self-pay

## 2023-06-30 NOTE — Telephone Encounter (Signed)
Patient called to cancel at time of visit. 1st No Show.   Eloy End   06/30/23 12:06 PM

## 2023-07-02 ENCOUNTER — Encounter: Payer: Self-pay | Admitting: Internal Medicine

## 2023-07-02 ENCOUNTER — Ambulatory Visit: Payer: Medicaid Other

## 2023-07-02 DIAGNOSIS — M25572 Pain in left ankle and joints of left foot: Secondary | ICD-10-CM | POA: Diagnosis not present

## 2023-07-02 DIAGNOSIS — M6281 Muscle weakness (generalized): Secondary | ICD-10-CM

## 2023-07-02 DIAGNOSIS — R2689 Other abnormalities of gait and mobility: Secondary | ICD-10-CM

## 2023-07-02 NOTE — Therapy (Signed)
OUTPATIENT PHYSICAL THERAPY TREATMENT   Patient Name: Shawn Harper MRN: [id number] DOB:[birth year], 27 y.o., male Today's Date: 07/02/2023  END OF SESSION:  PT End of Session - 07/02/23 0852     Visit Number 10    Number of Visits 20    Date for PT Re-Evaluation 08/12/23    Authorization Type [place] [facility] [employer]    PT Start Time 0849    PT Stop Time 0927    PT Time Calculation (min) 38 min                   Past Medical History:  Diagnosis Date   Anxiety    Depression    Dysrhythmia    Headache    Smoker    5 pack years, trying to quit   Past Surgical History:  Procedure Laterality Date   APPLICATION OF WOUND VAC  03/08/2022   Procedure: APPLICATION OF WOUND VAC;  Surgeon: Edwin Cap, DPM;  Location: [facility];  Service: Podiatry;;   FLAT FOOT RECONSTRUCTION-TAL GASTROC RECESSION Left 03/08/2022   Procedure: FLAT FOOT RECONSTRUCTION WITH FUSION OF JOINTS IN BACK OF FOOT; POSSIBLE CALF MUSCLE LENGTHENING;BONEGRAFT INTO FOOT TO BUILD ARCH; BONEGRAFT FROM LEG;  Surgeon: Edwin Cap, DPM;  Location: [facility];  Service: Podiatry;  Laterality: Left;  POPLITEL AND SAPHENOUS BLOCK   LEG SURGERY     within a month of birth, in cast for infancy   Patient Active Problem List   Diagnosis Date Noted   Chronic pain syndrome 01/08/2023   Complex regional pain syndrome type 2 of left lower extremity 01/08/2023   Leg length discrepancy 01/08/2023   Abnormality of gait and mobility 01/08/2023   Coalition, talocalcaneal    Osteoarthritis of left ankle and foot    Smoker 01/28/2021   Unprotected sex 10/07/2014   GAD (generalized anxiety disorder) 10/07/2014   Depression 10/07/2014   Former smoker     PCP: Shelva Majestic, MD  REFERRING PROVIDER: Edwin Cap, DPM  REFERRING DIAG:  [id number] (ICD-10-CM) - Pain due to internal orthopedic prosthetic devices, implants and grafts, initial encounter (HCC) M77.52 (ICD-10-CM) - Bone spur of left foot  THERAPY DIAG:   Pain in left ankle and joints of left foot  Muscle weakness (generalized)  Other abnormalities of gait and mobility  Rationale for Evaluation and Treatment: Rehabilitation  ONSET DATE: 03/28/2023  SUBJECTIVE:   SUBJECTIVE STATEMENT: Pt presents to PT with increased pain, dropped a weight on his foot last week. Has continued to be compliant with HEP.  PERTINENT HISTORY: Previous L foot surgery; Anxiety and Depression  PAIN:  Are you having pain?  Yes: NPRS scale: 4/10 Worst: 8/10 Pain location: L foot Pain description: sharp, NT Aggravating factors: Standing Relieving factors: Rest, ice  PRECAUTIONS: None  RED FLAGS: None   WEIGHT BEARING RESTRICTIONS: Yes - WBAT  FALLS:  Has patient fallen in last 6 months? No  LIVING ENVIRONMENT: Lives with: lives with their family Lives in: House/apartment  OCCUPATION: Door Comptroller  PLOF: Independent  PATIENT GOALS: decrease foot pain, get back to golfing and recreational activity without pain  NEXT MD VISIT: 05/21/2023  OBJECTIVE:   DIAGNOSTIC FINDINGS: See imaging  PATIENT SURVEYS:  FOTO: 43% function; 62% predicted  COGNITION: Overall cognitive status: Within functional limits for tasks assessed     SENSATION: Light touch: Impaired - L distal foot  POSTURE: No Significant postural limitations  PALPATION: Warmth noted at surgical site on medial ankle  LOWER  EXTREMITY ROM:  Active ROM Right eval Left eval Left  06/17/23  Hip flexion   5  Hip extension   4  Hip abduction   4  Hip adduction     Hip internal rotation     Hip external rotation     Knee flexion     Knee extension     Ankle dorsiflexion WNL 2   Ankle plantarflexion     Ankle inversion     Ankle eversion      (Blank rows = not tested)  LOWER EXTREMITY MMT:  MMT Right eval Left eval Left 05/17/23  Hip flexion     Hip extension     Hip abduction     Hip adduction     Hip internal rotation     Hip external rotation      Knee flexion     Knee extension     Ankle dorsiflexion WNL 4/5   Ankle plantarflexion WNL 3+/5 Unable to single heel raise  Ankle inversion WNL 2+/5   Ankle eversion WNL 2+/5    (Blank rows = not tested)  LOWER EXTREMITY SPECIAL TESTS:  DNT  FUNCTIONAL TESTS:  SLS: 3 seconds L 07/02/2023: 30 seconds L   GAIT: Distance walked: 44ft Assistive device utilized: None Level of assistance: Complete Independence Comments: slight antalgic gait on L   TREATMENT: [facility] Adult PT Treatment:                                                DATE: 07/02/23 Therapeutic Exercise: Rec bike lvl 3 x 3 min while taking subjective Slant board calf stretch 2x45"  Wobble board fwd/bwd 2x45" Tandem on foam x 30" each SLS on foam x 30" each Kickstand KB cross 15# x 45" each Standing heel raise 2x20 Eccentric L heel lowering 2x10 4in TRX squat 2x10 Tandem walk on foam beam x 4 laps Step up with 10# KB in ea hand 2x10 fwd with march 8in step Single leg press 2x10 75# Standard lunge x 10 L fwd - R unable Standng hip abd 2x10 37.5#  [facility] Adult PT Treatment:                                                DATE: 06/26/23 Therapeutic Exercise: Rec bike lvl 3 x 3 min while taking subjective Slant board calf stretch 2x45"  Wobble board fwd/bwd 2x45" Standing heel raise 2x20 Eccentric L heel lowering 2x10 4in Tandem on foam 2x30" Kickstand KB cross 10# 2x30" Tandem walk on foam beam x 4 laps Step up with 10# KB in ea hand 2x10 fwd with march 8in step Single leg press 3x10 70# Standng hip abd 2x10 37.5# TRX squat 2x10  [facility] Adult PT Treatment:                                                DATE: 06/24/23 Therapeutic Exercise: NuStep lvl 6 LE only x 3 min while taking subjective Slant board calf stretch 2x45"  Wobble board fwd/bwd 2x30" Standing heel raise 2x20 Eccentric L heel lowering 2x10 4in Tandem  on foam 2x30" Kickstand KB cross 10# 2x30" Step up with 10# KB in ea hand 2x10 fwd with  march 8in step Single leg press 3x10 65# L ankle inv/ev 2x15 GTB L single leg bridge 2x10 each  PATIENT EDUCATION:  Education details: continue HEP Person educated: Patient Education method: Explanation, Demonstration, and Handouts Education comprehension: verbalized understanding and returned demonstration  HOME EXERCISE PROGRAM: Access Code: [email or web address] URL: [email or web address] Date: 05/15/2023 Prepared by: Edwinna Areola  Exercises - Gastroc Stretch on Wall  - 1 x daily - 7 x weekly - 2-3 reps - 30 sec hold - Long Sitting Calf Stretch with Strap  - 1 x daily - 7 x weekly - 2 reps - 30 sec hold - Ankle Dorsiflexion with Resistance  - 1 x daily - 7 x weekly - 3 sets - 10 reps - red band hold - Ankle Inversion Eversion Towel Slide  - 1 x daily - 7 x weekly - 3 sets - 10 reps - Standing Tandem Balance with Counter Support  - 1 x daily - 7 x weekly - 2-3 reps - 30 sec hold - Ankle and Toe Plantarflexion with Resistance  - 1 x daily - 7 x weekly - 3 sets - 15 reps - black band hold  ASSESSMENT:  CLINICAL IMPRESSION: Pt was able to once again complete all prescribed exercises with no adverse effect with exception of attempted lunges due to foot pain. Therapy focused on improving ankle stability, strength, and balance. Pt still with distal L ankle strength and balance deficits but is improving greatly with therapy post surgery. Pt is continuing to progress with therapy, will continue per POC.   OBJECTIVE IMPAIRMENTS: Abnormal gait, decreased activity tolerance, decreased balance, decreased mobility, difficulty walking, decreased ROM, decreased strength, and pain   ACTIVITY LIMITATIONS: standing, squatting, stairs, transfers, and locomotion level  PARTICIPATION LIMITATIONS: driving, shopping, community activity, occupation, and yard work  PERSONAL FACTORS: 1-2 comorbidities: Previous L foot surgery; Anxiety and Depression  are also affecting patient's functional outcome.    REHAB POTENTIAL: Excellent  CLINICAL DECISION MAKING: Stable/uncomplicated  EVALUATION COMPLEXITY: Low   GOALS: Goals reviewed with patient? No  SHORT TERM GOALS: Target date: 05/27/2023   Pt will be compliant and knowledgeable with initial HEP for improved comfort and carryover Baseline: initial HEP given  Goal status: MET  2.  Pt will self report left foot pain no greater than 5/10 for improved comfort and functional ability Baseline: 8/10 at worst Goal status: MET   LONG TERM GOALS: Target date: 08/12/2023   Pt will improve FOTO function score to no less than 62% as proxy for functional improvement with home ADLs and community activities Baseline: 43% function Goal status: IN PROGRESS   2.  Pt will self report left foot pain no greater than 1-2/10 for improved comfort and functional ability Baseline: 8/10 at worst Goal status: IN PROGRESS   3.  Pt will improve L SLS time to no less than 30 seconds for improved stability and ankle proprioception for decreasing pain Baseline: 3 seconds 07/02/2023: 30 seconds Goal status: MET  4.  Pt will improve L ankle DF to no less than 10 degrees past neutral for improving gait and decreasing foot/ankle pain Baseline: 2 degrees Goal status: INITIAL  5.  Pt will improve all L ankle MMT to no less than 4/5 for all tested motions for improving comfort and functional ability with home ADLs and community activities Baseline: see MMT chart Goal status: IN PROGRESS  PLAN:  PT FREQUENCY: 2x/week  PT DURATION: 8 weeks  PLANNED INTERVENTIONS: Therapeutic exercises, Therapeutic activity, Neuromuscular re-education, Balance training, Gait training, Patient/Family education, Self Care, Joint mobilization, Dry Needling, Electrical stimulation, Cryotherapy, Moist heat, Vasopneumatic device, Manual therapy, and Re-evaluation  PLAN FOR NEXT SESSION: assess HEP response, ankle strengthening, balance, ankle ROM   Eloy End PT   07/02/23 9:34 AM

## 2023-07-09 ENCOUNTER — Telehealth: Payer: Self-pay

## 2023-07-09 NOTE — Telephone Encounter (Signed)
Looks like a healthy 27 year old. Ok for direct colonoscopy in the [facility]. Thanks

## 2023-07-09 NOTE — Telephone Encounter (Signed)
Dr. Marina Goodell,   New patient scheduled for a direct colon with you he has never been seen in our office. Referral was sent over for bright red blood per rectum and family history of colon polyps. Please advise if you're ok to proceed as scheduled or if he will need and OV with a provider beforehand.   Thank you,  Pre visit

## 2023-07-17 ENCOUNTER — Ambulatory Visit: Payer: Medicaid Other | Attending: Podiatry

## 2023-07-17 DIAGNOSIS — R600 Localized edema: Secondary | ICD-10-CM | POA: Diagnosis present

## 2023-07-17 DIAGNOSIS — M25572 Pain in left ankle and joints of left foot: Secondary | ICD-10-CM | POA: Insufficient documentation

## 2023-07-17 DIAGNOSIS — M6281 Muscle weakness (generalized): Secondary | ICD-10-CM | POA: Insufficient documentation

## 2023-07-17 DIAGNOSIS — R2689 Other abnormalities of gait and mobility: Secondary | ICD-10-CM | POA: Insufficient documentation

## 2023-07-17 NOTE — Therapy (Signed)
OUTPATIENT PHYSICAL THERAPY TREATMENT   Patient Name: Shawn Harper MRN: [id number] DOB:10-Dec-1995, 27 y.o., male Today's Date: 07/17/2023  END OF SESSION:  PT End of Session - 07/17/23 0929     Visit Number 11    Number of Visits 20    Date for PT Re-Evaluation 08/12/23    Authorization Type [place] [facility] [employer]    PT Start Time 0930    PT Stop Time 1009    PT Time Calculation (min) 39 min                    Past Medical History:  Diagnosis Date   Anxiety    Depression    Dysrhythmia    Headache    Smoker    5 pack years, trying to quit   Past Surgical History:  Procedure Laterality Date   APPLICATION OF WOUND VAC  03/08/2022   Procedure: APPLICATION OF WOUND VAC;  Surgeon: Edwin Cap, DPM;  Location: [facility];  Service: Podiatry;;   FLAT FOOT RECONSTRUCTION-TAL GASTROC RECESSION Left 03/08/2022   Procedure: FLAT FOOT RECONSTRUCTION WITH FUSION OF JOINTS IN BACK OF FOOT; POSSIBLE CALF MUSCLE LENGTHENING;BONEGRAFT INTO FOOT TO BUILD ARCH; BONEGRAFT FROM LEG;  Surgeon: Edwin Cap, DPM;  Location: [facility];  Service: Podiatry;  Laterality: Left;  POPLITEL AND SAPHENOUS BLOCK   LEG SURGERY     within a month of birth, in cast for infancy   Patient Active Problem List   Diagnosis Date Noted   Chronic pain syndrome 01/08/2023   Complex regional pain syndrome type 2 of left lower extremity 01/08/2023   Leg length discrepancy 01/08/2023   Abnormality of gait and mobility 01/08/2023   Coalition, talocalcaneal    Osteoarthritis of left ankle and foot    Smoker 01/28/2021   Unprotected sex 10/07/2014   GAD (generalized anxiety disorder) 10/07/2014   Depression 10/07/2014   Former smoker     PCP: Shelva Majestic, MD  REFERRING PROVIDER: Edwin Cap, DPM  REFERRING DIAG:  [phone] (ICD-10-CM) - Pain due to internal orthopedic prosthetic devices, implants and grafts, initial encounter (HCC) M77.52 (ICD-10-CM) - Bone spur of left foot  THERAPY DIAG:   Pain in left ankle and joints of left foot  Muscle weakness (generalized)  Other abnormalities of gait and mobility  Rationale for Evaluation and Treatment: Rehabilitation  ONSET DATE: 03/28/2023  SUBJECTIVE:   SUBJECTIVE STATEMENT: Pt presents to PT with reports of increased pain and discomfort. Is unsure what has been increasing pain.   PERTINENT HISTORY: Previous L foot surgery; Anxiety and Depression  PAIN:  Are you having pain?  Yes: NPRS scale: 8/10 Worst: 8/10 Pain location: L foot Pain description: sharp, NT Aggravating factors: Standing Relieving factors: Rest, ice  PRECAUTIONS: None  RED FLAGS: None   WEIGHT BEARING RESTRICTIONS: Yes - WBAT  FALLS:  Has patient fallen in last 6 months? No  LIVING ENVIRONMENT: Lives with: lives with their family Lives in: House/apartment  OCCUPATION: Door Comptroller  PLOF: Independent  PATIENT GOALS: decrease foot pain, get back to golfing and recreational activity without pain  NEXT MD VISIT: 05/21/2023  OBJECTIVE:   DIAGNOSTIC FINDINGS: See imaging  PATIENT SURVEYS:  FOTO: 43% function; 62% predicted  COGNITION: Overall cognitive status: Within functional limits for tasks assessed     SENSATION: Light touch: Impaired - L distal foot  POSTURE: No Significant postural limitations  PALPATION: Warmth noted at surgical site on medial ankle  LOWER EXTREMITY ROM:  Active ROM Right eval Left eval Left  06/17/23  Hip flexion   5  Hip extension   4  Hip abduction   4  Hip adduction     Hip internal rotation     Hip external rotation     Knee flexion     Knee extension     Ankle dorsiflexion WNL 2   Ankle plantarflexion     Ankle inversion     Ankle eversion      (Blank rows = not tested)  LOWER EXTREMITY MMT:  MMT Right eval Left eval Left 05/17/23  Hip flexion     Hip extension     Hip abduction     Hip adduction     Hip internal rotation     Hip external rotation     Knee flexion      Knee extension     Ankle dorsiflexion WNL 4/5   Ankle plantarflexion WNL 3+/5 Unable to single heel raise  Ankle inversion WNL 2+/5   Ankle eversion WNL 2+/5    (Blank rows = not tested)  LOWER EXTREMITY SPECIAL TESTS:  DNT  FUNCTIONAL TESTS:  SLS: 3 seconds L 07/02/2023: 30 seconds L   GAIT: Distance walked: 83ft Assistive device utilized: None Level of assistance: Complete Independence Comments: slight antalgic gait on L   TREATMENT: [facility] Adult PT Treatment:                                                DATE: 07/17/23 Therapeutic Exercise: Rec bike lvl 4 x 3 min while taking subjective Slant board calf stretch 2x45"  Wobble board fwd/bwd 2x60" SLS on foam 2x30" each Kickstand KB cross 15# x 45" each Standing heel raise 2x20 TRX squat 2x15 TRX lung L front x 10 Tandem walk on foam beam x 4 laps Side stepping on foam beam x 2 laps Single leg press 2x10 75# Standing hip abd 2x10 37.5# Step up 12in step x 10 10# KB each hand Lateral step up x 10 12in   [facility] Adult PT Treatment:                                                DATE: 07/02/23 Therapeutic Exercise: Rec bike lvl 3 x 3 min while taking subjective Slant board calf stretch 2x45"  Wobble board fwd/bwd 2x45" Tandem on foam x 30" each SLS on foam x 30" each Kickstand KB cross 15# x 45" each Standing heel raise 2x20 Eccentric L heel lowering 2x10 4in TRX squat 2x10 Tandem walk on foam beam x 4 laps Step up with 10# KB in ea hand 2x10 fwd with march 8in step Single leg press 2x10 75# Standard lunge x 10 L fwd - R unable Standng hip abd 2x10 37.5#  [facility] Adult PT Treatment:                                                DATE: 06/26/23 Therapeutic Exercise: Rec bike lvl 3 x 3 min while taking subjective Slant board calf stretch 2x45"  Wobble board fwd/bwd 2x45" Standing  heel raise 2x20 Eccentric L heel lowering 2x10 4in Tandem on foam 2x30" Kickstand KB cross 10# 2x30" Tandem walk on foam beam x 4  laps Step up with 10# KB in ea hand 2x10 fwd with march 8in step Single leg press 3x10 70# Standng hip abd 2x10 37.5# TRX squat 2x10  [facility] Adult PT Treatment:                                                DATE: 06/24/23 Therapeutic Exercise: NuStep lvl 6 LE only x 3 min while taking subjective Slant board calf stretch 2x45"  Wobble board fwd/bwd 2x30" Standing heel raise 2x20 Eccentric L heel lowering 2x10 4in Tandem on foam 2x30" Kickstand KB cross 10# 2x30" Step up with 10# KB in ea hand 2x10 fwd with march 8in step Single leg press 3x10 65# L ankle inv/ev 2x15 GTB L single leg bridge 2x10 each  PATIENT EDUCATION:  Education details: continue HEP Person educated: Patient Education method: Explanation, Demonstration, and Handouts Education comprehension: verbalized understanding and returned demonstration  HOME EXERCISE PROGRAM: Access Code: [email or web address] URL: [email or web address] Date: 05/15/2023 Prepared by: Edwinna Areola  Exercises - Gastroc Stretch on Wall  - 1 x daily - 7 x weekly - 2-3 reps - 30 sec hold - Long Sitting Calf Stretch with Strap  - 1 x daily - 7 x weekly - 2 reps - 30 sec hold - Ankle Dorsiflexion with Resistance  - 1 x daily - 7 x weekly - 3 sets - 10 reps - red band hold - Ankle Inversion Eversion Towel Slide  - 1 x daily - 7 x weekly - 3 sets - 10 reps - Standing Tandem Balance with Counter Support  - 1 x daily - 7 x weekly - 2-3 reps - 30 sec hold - Ankle and Toe Plantarflexion with Resistance  - 1 x daily - 7 x weekly - 3 sets - 15 reps - black band hold  ASSESSMENT:  CLINICAL IMPRESSION: Pt was able to complete all prescribed exercises with good tolerance to progression. Therapy continued to work on L ankle motion and strength with progression of squatting and other functional activity. Noted improvement in pain post session. Pt continues to benefit from skilled PT services, will continue per POC as prescribed.   OBJECTIVE IMPAIRMENTS:  Abnormal gait, decreased activity tolerance, decreased balance, decreased mobility, difficulty walking, decreased ROM, decreased strength, and pain   ACTIVITY LIMITATIONS: standing, squatting, stairs, transfers, and locomotion level  PARTICIPATION LIMITATIONS: driving, shopping, community activity, occupation, and yard work  PERSONAL FACTORS: 1-2 comorbidities: Previous L foot surgery; Anxiety and Depression  are also affecting patient's functional outcome.   REHAB POTENTIAL: Excellent  CLINICAL DECISION MAKING: Stable/uncomplicated  EVALUATION COMPLEXITY: Low   GOALS: Goals reviewed with patient? No  SHORT TERM GOALS: Target date: 05/27/2023   Pt will be compliant and knowledgeable with initial HEP for improved comfort and carryover Baseline: initial HEP given  Goal status: MET  2.  Pt will self report left foot pain no greater than 5/10 for improved comfort and functional ability Baseline: 8/10 at worst Goal status: MET   LONG TERM GOALS: Target date: 08/12/2023   Pt will improve FOTO function score to no less than 62% as proxy for functional improvement with home ADLs and community activities Baseline: 43% function Goal status: IN PROGRESS   2.  Pt will self report left foot pain no greater than 1-2/10 for improved comfort and functional ability Baseline: 8/10 at worst Goal status: IN PROGRESS   3.  Pt will improve L SLS time to no less than 30 seconds for improved stability and ankle proprioception for decreasing pain Baseline: 3 seconds 07/02/2023: 30 seconds Goal status: MET  4.  Pt will improve L ankle DF to no less than 10 degrees past neutral for improving gait and decreasing foot/ankle pain Baseline: 2 degrees Goal status: INITIAL  5.  Pt will improve all L ankle MMT to no less than 4/5 for all tested motions for improving comfort and functional ability with home ADLs and community activities Baseline: see MMT chart Goal status: IN PROGRESS   PLAN:  PT  FREQUENCY: 2x/week  PT DURATION: 8 weeks  PLANNED INTERVENTIONS: Therapeutic exercises, Therapeutic activity, Neuromuscular re-education, Balance training, Gait training, Patient/Family education, Self Care, Joint mobilization, Dry Needling, Electrical stimulation, Cryotherapy, Moist heat, Vasopneumatic device, Manual therapy, and Re-evaluation  PLAN FOR NEXT SESSION: assess HEP response, ankle strengthening, balance, ankle ROM   Eloy End PT  07/17/23 10:13 AM

## 2023-07-22 ENCOUNTER — Ambulatory Visit: Payer: Medicaid Other

## 2023-07-22 ENCOUNTER — Telehealth: Payer: Self-pay

## 2023-07-22 NOTE — Telephone Encounter (Signed)
PT called and left a voicemail message regarding missed visit. Left reminder of next appointment and call back number.   Shawn Harper   07/22/23 9:51 AM

## 2023-07-22 NOTE — Therapy (Incomplete)
OUTPATIENT PHYSICAL THERAPY TREATMENT   Patient Name: Shawn Harper MRN: [id number] DOB:09-06-1995, 27 y.o., male Today's Date: 07/22/2023  END OF SESSION:           Past Medical History:  Diagnosis Date   Anxiety    Depression    Dysrhythmia    Headache    Smoker    5 pack years, trying to quit   Past Surgical History:  Procedure Laterality Date   APPLICATION OF WOUND VAC  03/08/2022   Procedure: APPLICATION OF WOUND VAC;  Surgeon: Edwin Cap, DPM;  Location: [facility];  Service: Podiatry;;   FLAT FOOT RECONSTRUCTION-TAL GASTROC RECESSION Left 03/08/2022   Procedure: FLAT FOOT RECONSTRUCTION WITH FUSION OF JOINTS IN BACK OF FOOT; POSSIBLE CALF MUSCLE LENGTHENING;BONEGRAFT INTO FOOT TO BUILD ARCH; BONEGRAFT FROM LEG;  Surgeon: Edwin Cap, DPM;  Location: [facility];  Service: Podiatry;  Laterality: Left;  POPLITEL AND SAPHENOUS BLOCK   LEG SURGERY     within a month of birth, in cast for infancy   Patient Active Problem List   Diagnosis Date Noted   Chronic pain syndrome 01/08/2023   Complex regional pain syndrome type 2 of left lower extremity 01/08/2023   Leg length discrepancy 01/08/2023   Abnormality of gait and mobility 01/08/2023   Coalition, talocalcaneal    Osteoarthritis of left ankle and foot    Smoker 01/28/2021   Unprotected sex 10/07/2014   GAD (generalized anxiety disorder) 10/07/2014   Depression 10/07/2014   Former smoker     PCP: Shelva Majestic, MD  REFERRING PROVIDER: Edwin Cap, DPM  REFERRING DIAG:  [phone] (ICD-10-CM) - Pain due to internal orthopedic prosthetic devices, implants and grafts, initial encounter (HCC) M77.52 (ICD-10-CM) - Bone spur of left foot  THERAPY DIAG:  No diagnosis found.  Rationale for Evaluation and Treatment: Rehabilitation  ONSET DATE: 03/28/2023  SUBJECTIVE:   SUBJECTIVE STATEMENT: ***  PERTINENT HISTORY: Previous L foot surgery; Anxiety and Depression  PAIN:  Are you having  pain?  Yes: NPRS scale: 8/10 Worst: 8/10 Pain location: L foot Pain description: sharp, NT Aggravating factors: Standing Relieving factors: Rest, ice  PRECAUTIONS: None  RED FLAGS: None   WEIGHT BEARING RESTRICTIONS: Yes - WBAT  FALLS:  Has patient fallen in last 6 months? No  LIVING ENVIRONMENT: Lives with: lives with their family Lives in: House/apartment  OCCUPATION: Door Comptroller  PLOF: Independent  PATIENT GOALS: decrease foot pain, get back to golfing and recreational activity without pain  NEXT MD VISIT: 05/21/2023  OBJECTIVE:   DIAGNOSTIC FINDINGS: See imaging  PATIENT SURVEYS:  FOTO: 43% function; 62% predicted  COGNITION: Overall cognitive status: Within functional limits for tasks assessed     SENSATION: Light touch: Impaired - L distal foot  POSTURE: No Significant postural limitations  PALPATION: Warmth noted at surgical site on medial ankle  LOWER EXTREMITY ROM:  Active ROM Right eval Left eval Left  06/17/23  Hip flexion   5  Hip extension   4  Hip abduction   4  Hip adduction     Hip internal rotation     Hip external rotation     Knee flexion     Knee extension     Ankle dorsiflexion WNL 2   Ankle plantarflexion     Ankle inversion     Ankle eversion      (Blank rows = not tested)  LOWER EXTREMITY MMT:  MMT Right eval Left eval Left 05/17/23  Hip  flexion     Hip extension     Hip abduction     Hip adduction     Hip internal rotation     Hip external rotation     Knee flexion     Knee extension     Ankle dorsiflexion WNL 4/5   Ankle plantarflexion WNL 3+/5 Unable to single heel raise  Ankle inversion WNL 2+/5   Ankle eversion WNL 2+/5    (Blank rows = not tested)  LOWER EXTREMITY SPECIAL TESTS:  DNT  FUNCTIONAL TESTS:  SLS: 3 seconds L 07/02/2023: 30 seconds L   GAIT: Distance walked: 25ft Assistive device utilized: None Level of assistance: Complete Independence Comments: slight antalgic gait on  L   TREATMENT: [facility] Adult PT Treatment:                                                DATE: 07/22/23 Therapeutic Exercise: Rec bike lvl 4 x 3 min while taking subjective Slant board calf stretch 2x45"  Wobble board fwd/bwd 2x60" SLS on foam 2x30" each Kickstand KB cross 15# x 45" each Standing heel raise 2x20 TRX squat 2x15 TRX lung L front x 10 Tandem walk on foam beam x 4 laps Side stepping on foam beam x 2 laps Single leg press 2x10 75# Standing hip abd 2x10 37.5# Step up 12in step x 10 10# KB each hand Lateral step up x 10 12in   [facility] Adult PT Treatment:                                                DATE: 07/17/23 Therapeutic Exercise: Rec bike lvl 4 x 3 min while taking subjective Slant board calf stretch 2x45"  Wobble board fwd/bwd 2x60" SLS on foam 2x30" each Kickstand KB cross 15# x 45" each Standing heel raise 2x20 TRX squat 2x15 TRX lung L front x 10 Tandem walk on foam beam x 4 laps Side stepping on foam beam x 2 laps Single leg press 2x10 75# Standing hip abd 2x10 37.5# Step up 12in step x 10 10# KB each hand Lateral step up x 10 12in   [facility] Adult PT Treatment:                                                DATE: 07/02/23 Therapeutic Exercise: Rec bike lvl 3 x 3 min while taking subjective Slant board calf stretch 2x45"  Wobble board fwd/bwd 2x45" Tandem on foam x 30" each SLS on foam x 30" each Kickstand KB cross 15# x 45" each Standing heel raise 2x20 Eccentric L heel lowering 2x10 4in TRX squat 2x10 Tandem walk on foam beam x 4 laps Step up with 10# KB in ea hand 2x10 fwd with march 8in step Single leg press 2x10 75# Standard lunge x 10 L fwd - R unable Standng hip abd 2x10 37.5#  [facility] Adult PT Treatment:  DATE: 06/26/23 Therapeutic Exercise: Rec bike lvl 3 x 3 min while taking subjective Slant board calf stretch 2x45"  Wobble board fwd/bwd 2x45" Standing heel raise 2x20 Eccentric L heel  lowering 2x10 4in Tandem on foam 2x30" Kickstand KB cross 10# 2x30" Tandem walk on foam beam x 4 laps Step up with 10# KB in ea hand 2x10 fwd with march 8in step Single leg press 3x10 70# Standng hip abd 2x10 37.5# TRX squat 2x10  [facility] Adult PT Treatment:                                                DATE: 06/24/23 Therapeutic Exercise: NuStep lvl 6 LE only x 3 min while taking subjective Slant board calf stretch 2x45"  Wobble board fwd/bwd 2x30" Standing heel raise 2x20 Eccentric L heel lowering 2x10 4in Tandem on foam 2x30" Kickstand KB cross 10# 2x30" Step up with 10# KB in ea hand 2x10 fwd with march 8in step Single leg press 3x10 65# L ankle inv/ev 2x15 GTB L single leg bridge 2x10 each  PATIENT EDUCATION:  Education details: continue HEP Person educated: Patient Education method: Explanation, Demonstration, and Handouts Education comprehension: verbalized understanding and returned demonstration  HOME EXERCISE PROGRAM: Access Code: [email or web address] URL: [email or web address] Date: 05/15/2023 Prepared by: Edwinna Areola  Exercises - Gastroc Stretch on Wall  - 1 x daily - 7 x weekly - 2-3 reps - 30 sec hold - Long Sitting Calf Stretch with Strap  - 1 x daily - 7 x weekly - 2 reps - 30 sec hold - Ankle Dorsiflexion with Resistance  - 1 x daily - 7 x weekly - 3 sets - 10 reps - red band hold - Ankle Inversion Eversion Towel Slide  - 1 x daily - 7 x weekly - 3 sets - 10 reps - Standing Tandem Balance with Counter Support  - 1 x daily - 7 x weekly - 2-3 reps - 30 sec hold - Ankle and Toe Plantarflexion with Resistance  - 1 x daily - 7 x weekly - 3 sets - 15 reps - black band hold  ASSESSMENT:  CLINICAL IMPRESSION: *** Pt was able to complete all prescribed exercises with good tolerance to progression. Therapy continued to work on L ankle motion and strength with progression of squatting and other functional activity. Noted improvement in pain post session. Pt  continues to benefit from skilled PT services, will continue per POC as prescribed.   OBJECTIVE IMPAIRMENTS: Abnormal gait, decreased activity tolerance, decreased balance, decreased mobility, difficulty walking, decreased ROM, decreased strength, and pain   ACTIVITY LIMITATIONS: standing, squatting, stairs, transfers, and locomotion level  PARTICIPATION LIMITATIONS: driving, shopping, community activity, occupation, and yard work  PERSONAL FACTORS: 1-2 comorbidities: Previous L foot surgery; Anxiety and Depression  are also affecting patient's functional outcome.   REHAB POTENTIAL: Excellent  CLINICAL DECISION MAKING: Stable/uncomplicated  EVALUATION COMPLEXITY: Low   GOALS: Goals reviewed with patient? No  SHORT TERM GOALS: Target date: 05/27/2023   Pt will be compliant and knowledgeable with initial HEP for improved comfort and carryover Baseline: initial HEP given  Goal status: MET  2.  Pt will self report left foot pain no greater than 5/10 for improved comfort and functional ability Baseline: 8/10 at worst Goal status: MET   LONG TERM GOALS: Target date: 08/12/2023   Pt will improve FOTO function score  to no less than 62% as proxy for functional improvement with home ADLs and community activities Baseline: 43% function Goal status: IN PROGRESS   2.  Pt will self report left foot pain no greater than 1-2/10 for improved comfort and functional ability Baseline: 8/10 at worst Goal status: IN PROGRESS   3.  Pt will improve L SLS time to no less than 30 seconds for improved stability and ankle proprioception for decreasing pain Baseline: 3 seconds 07/02/2023: 30 seconds Goal status: MET  4.  Pt will improve L ankle DF to no less than 10 degrees past neutral for improving gait and decreasing foot/ankle pain Baseline: 2 degrees Goal status: INITIAL  5.  Pt will improve all L ankle MMT to no less than 4/5 for all tested motions for improving comfort and functional  ability with home ADLs and community activities Baseline: see MMT chart Goal status: IN PROGRESS   PLAN:  PT FREQUENCY: 2x/week  PT DURATION: 8 weeks  PLANNED INTERVENTIONS: Therapeutic exercises, Therapeutic activity, Neuromuscular re-education, Balance training, Gait training, Patient/Family education, Self Care, Joint mobilization, Dry Needling, Electrical stimulation, Cryotherapy, Moist heat, Vasopneumatic device, Manual therapy, and Re-evaluation  PLAN FOR NEXT SESSION: assess HEP response, ankle strengthening, balance, ankle ROM   Eloy End PT  07/22/23 7:57 AM

## 2023-07-24 ENCOUNTER — Ambulatory Visit: Payer: Medicaid Other

## 2023-07-24 VITALS — Ht 74.0 in | Wt 180.0 lb

## 2023-07-24 DIAGNOSIS — K625 Hemorrhage of anus and rectum: Secondary | ICD-10-CM

## 2023-07-24 DIAGNOSIS — Z83719 Family history of colon polyps, unspecified: Secondary | ICD-10-CM

## 2023-07-24 MED ORDER — NA SULFATE-K SULFATE-MG SULF 17.5-3.13-1.6 GM/177ML PO SOLN: 1.0000 | Freq: Once | ORAL | 0 refills | Status: AC

## 2023-07-24 NOTE — Progress Notes (Signed)
No egg or soy allergy known to patient  No issues known to pt with past sedation with any surgeries or procedures Patient denies ever being told they had issues or difficulty with intubation  No FH of Malignant Hyperthermia Pt is not on diet pills Pt is not on  home 02  Pt is not on blood thinners  Pt reports constipation takes stool softer OTC 2 times daily  No A fib or A flutter Have any cardiac testing pending-- no  LOA: independent  Prep: suprep extra miralax   Patient's chart reviewed by Cathlyn Parsons CNRA prior to previsit and patient appropriate for the [facility].  Previsit completed and red dot placed by patient's name on their procedure day (on provider's schedule).     PV competed with patient. Prep instructions sent via mychart and home address. Goodrx coupon for [facility] provided to use for price reduction if needed.

## 2023-07-28 ENCOUNTER — Encounter
Payer: Medicaid Other | Attending: Physical Medicine and Rehabilitation | Admitting: Physical Medicine and Rehabilitation

## 2023-07-28 VITALS — BP 135/81 | HR 68 | Ht 74.0 in | Wt 170.6 lb

## 2023-07-28 DIAGNOSIS — R269 Unspecified abnormalities of gait and mobility: Secondary | ICD-10-CM | POA: Insufficient documentation

## 2023-07-28 DIAGNOSIS — M2170 Unequal limb length (acquired), unspecified site: Secondary | ICD-10-CM | POA: Diagnosis present

## 2023-07-28 DIAGNOSIS — M19072 Primary osteoarthritis, left ankle and foot: Secondary | ICD-10-CM | POA: Diagnosis present

## 2023-07-28 DIAGNOSIS — M2042 Other hammer toe(s) (acquired), left foot: Secondary | ICD-10-CM | POA: Insufficient documentation

## 2023-07-28 MED ORDER — MELOXICAM 7.5 MG PO TABS: ORAL_TABLET | ORAL | 5 refills | Status: AC

## 2023-07-28 MED ORDER — GABAPENTIN 300 MG PO CAPS: 300.0000 mg | ORAL_CAPSULE | Freq: Two times a day (BID) | ORAL | 5 refills | Status: AC

## 2023-07-28 NOTE — Patient Instructions (Addendum)
I'll message you once results of metabolic panel are back  I've refilled gabapentin and meloxicam for 5-6 months  Follow up in 6 months, if things are still going well that appointment we will move to PRN  Discussed possible hammertoe surgery, monitorring for nonblancheable erythema on toes and management of red areas with pressure offloading and barrier dressings if needed to prevent wounds

## 2023-07-28 NOTE — Progress Notes (Signed)
Subjective:    Patient ID: Shawn Harper, male    DOB: April 08, 1996, 27 y.o.   MRN: [id number]  HPI   Shawn Harper is a 27 y.o. year old male  who  has a past medical history of Anxiety, Depression, Dysrhythmia, Headache, and Smoker.   They are presenting to PM&R clinic for follow up related to  L foot pain s/p  L foot double arthrodesis and resection July 2023; suspected CRPS however this is less bothersome then localized joint pain.   Plan from last visit:  Osteoarthritis of left ankle and foot Follow up with your surgeon for possible bone regeneration infusions and possible surgery for bone spur shaving and pin removal   We discussed risks and benefits of starting low dose narcotic such as Tramadol, which you wish to defer at this time due to addictive personality and concerns about dependence. This is reasonable.   Start Meloxicam 7.5 mg with one tablet in the morning. If no side effects after 1 week, you can increase it to 2 tablets in the morning. Do not take with other over the counter NSAIDs like ibuprofen; you can take this with tylenol.    Follow up in 3 months or sooner if any concerns   Complex regional pain syndrome type 2 of left lower extremity Continue gabapentin; if daytime sedation too much, can decrease daytime doses and keep larger dose at nighttime   Will defer transition to lyrica at this time per patient preference   Also recommend trial of over-the-counter lidocaine cream and capsaicin cream to the toes for nerve pain.  For capsaicin cream, if tolerated, leave it on for 20 minutes before wiping off.   Discussed possible referral for peripheral nerve stimulator; given pain is mostly localized in the midfoot, can try surgical intervention for pin removal first. If ineffective, we can discuss referral next visit or patient can message me sooner for referral.    Abnormality of gait and mobility   Using shoe insert for now, patient has not followed up with [facility]  due to no perceived benefit; continue insert management with your surgeon.    Continue an active lifestyle as tolerated   Interval Hx:  - Therapies: He does HEP "to an extent"; he does [employer] and this involves walking a lot of slights of stairs and things, keeps active. Currently in PT, still getting quite a bit of benefit especially regarding imbalances in his leg. MUCH better than prior PT sessions.    - Follow ups: 03/28/2023 had Removal of hardware, excision of talar exostosis. 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 He will follow with me in 6 months for new full set of radiographs of the left foot  -- Hardware removal "took a lot of things away; a lot of the nerve pain" Some steps can be painful. He is frustrated he still cannot play with his son because of the pain.    - Falls: none   - DME: No longer needs a cane to walk since his surgery! Still using shoe inserts, getting a lot of benefit from them, notices when he golfs and is wearing those shoes the difference.    - Medications:  --meloxicam - Takes twice daily; has had no side effects to the meloxicam. Does have some blood in  his stool but only with straining. Has significant benefit from it.   --Gabapentin - takes 300 mg BID; "that seems to be the perfect dose where it doesn't make me tired but I don't feel much nerve pain."  --OTC lidocaine/capsaicin creams - never tried; did try some CBD creams without apparent benefit.    - Other concerns: no, "I feel like I'm doing the best I have been since I had that surgery".    Pain Inventory Average Pain 5 Pain Right Now 5 My pain is constant, sharp, burning, dull, stabbing, tingling, and aching  In the last 24 hours, has pain interfered with the following? General activity 5 Relation with  others 10 Enjoyment of life 8 What TIME of day is your pain at its worst? morning , daytime, evening, and night Sleep (in general) Poor  Pain is worse with: walking, bending, inactivity, standing, and some activites Pain improves with: rest and medication Relief from Meds: 5  Family History  Problem Relation Age of Onset   Hypertension Mother    Hyperlipidemia Mother    Depression Mother        after loss of son   Anxiety disorder Mother        after loss of son   Colon polyps Mother        at 6   Other Father        does not talk to dad   Obesity Sister    Syncope episode Sister    Suicidality Brother        36, states was ruled accident, family think suicide   COPD Maternal Grandmother    Heart disease Maternal Grandfather    Colon cancer Neg Hx    Esophageal cancer Neg Hx    Rectal cancer Neg Hx    Stomach cancer Neg Hx    Social History   Socioeconomic History   Marital status: Significant Other    Spouse name: Not on file   Number of children: 1   Years of education: Not on file   Highest education level: Not on file  Occupational History   Not on file  Tobacco Use   Smoking status: Every Day    Types: E-cigarettes   Smokeless tobacco: Never   Tobacco comments:    CBD Vape  Vaping Use   Vaping status: Every Day   Substances: Nicotine, CBD, Flavoring  Substance and Sexual Activity   Alcohol use: Not Currently    Alcohol/week: 0.0 - 1.0 standard drinks of alcohol    Comment: occasional beer    Drug use: Yes    Types: Marijuana    Comment: CBD  Vape not actual weed encouraged against use   Sexual activity: Yes    Partners: Female  Other Topics Concern   Not on file  Social History Narrative   Engaged July 2024-Long term relationship since 1546. 67 year old son Shawn Harper in 12/2017.    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    HS graduate      Hobbies: time with girlfriend and son, netflix, working out- really enjoys   -prior Diplomatic Services operational officer  Social Drivers of Corporate investment banker Strain: Not on file  Food Insecurity: Not on file  Transportation Needs: Not on file  Physical Activity: Not on file  Stress: Not on file  Social Connections: Unknown (12/24/2021)   Received from [facility], [facility]   Social Network    Social Network: Not on file   Past Surgical History:  Procedure Laterality Date   APPLICATION OF WOUND VAC  03/08/2022   Procedure: APPLICATION OF WOUND VAC;  Surgeon: Edwin Cap, DPM;  Location: [facility] OR[facility];  Service: Podiatry;;   FLAT FOOT RECONSTRUCTION-TAL GASTROC RECESSION Left 03/08/2022   Procedure: FLAT FOOT RECONSTRUCTION WITH FUSION OF JOINTS IN BACK OF FOOT; POSSIBLE CALF MUSCLE LENGTHENING;BONEGRAFT INTO FOOT TO BUILD ARCH; BONEGRAFT FROM LEG;  Surgeon: Edwin Cap, DPM;  Location: [facility] OR[facility];  Service: Podiatry;  Laterality: Left;  POPLITEL AND SAPHENOUS BLOCK   LEG SURGERY     within a month of birth, in cast for infancy   Past Surgical History:  Procedure Laterality Date   APPLICATION OF WOUND VAC  03/08/2022   Procedure: APPLICATION OF WOUND VAC;  Surgeon: Edwin Cap, DPM;  Location: [facility];  Service: Podiatry;;   FLAT FOOT RECONSTRUCTION-TAL GASTROC RECESSION Left 03/08/2022   Procedure: FLAT FOOT RECONSTRUCTION WITH FUSION OF JOINTS IN BACK OF FOOT; POSSIBLE CALF MUSCLE LENGTHENING;BONEGRAFT INTO FOOT TO BUILD ARCH; BONEGRAFT FROM LEG;  Surgeon: Edwin Cap, DPM;  Location: [facility];  Service: Podiatry;  Laterality: Left;  POPLITEL AND SAPHENOUS BLOCK   LEG SURGERY     within a month of birth, in cast for infancy   Past Medical History:  Diagnosis Date   Anxiety    Depression    Dysrhythmia     Headache    Smoker    5 pack years, trying to quit   BP 135/81   Pulse 68   Ht 6\' 2"  (1.88 m)   Wt 170 lb 9.6 oz (77.4 kg)   SpO2 97%   BMI 21.90 kg/m   Opioid Risk Score:   Fall Risk Score:  `1  Depression screen [facility] 2/9     07/28/2023   11:25 AM 02/27/2023   10:56 AM 02/19/2023    9:35 AM 01/08/2023   11:39 AM 01/03/2022    4:05 PM 06/29/2019    9:28 AM 12/25/2017   11:25 AM  Depression screen [facility] 2/9  Decreased Interest 3 1 1 2  0 0 2  Down, Depressed, Hopeless 3 1 1 2  0 0 1  PHQ - 2 Score 6 2 2 4  0 0 3  Altered sleeping  2  3 0 1 1  Tired, decreased energy  3  2 3  3   Change in appetite  3  2 0 0 2  Feeling bad or failure about yourself   0  2 0 1 1  Trouble concentrating  3  2 0 1 1  Moving slowly or fidgety/restless  0  0 0 0 0  Suicidal thoughts  0  0 0 0 0  PHQ-9 Score  13  15 3 3 11   Difficult doing work/chores  Somewhat difficult  Extremely dIfficult Not difficult at all Not difficult at all Somewhat difficult    Review of Systems  Constitutional: Negative.   HENT: Negative.    Eyes: Negative.   Respiratory: Negative.    Cardiovascular: Negative.   Gastrointestinal: Negative.   Endocrine: Negative.   Genitourinary: Negative.   Musculoskeletal:  Left foot pain  Allergic/Immunologic: Negative.   Neurological: Negative.   Hematological: Negative.   Psychiatric/Behavioral:  Positive for dysphoric mood.   All other systems reviewed and are negative.      Objective:   Physical Exam    PE: Constitution: Appropriate appearance for age. No apparent distress   Resp: No respiratory distress. No accessory muscle usage. on RA Cardio: Well perfused appearance. No peripheral edema. Abdomen: Nondistended.  Psych: Appropriate mood and affect. Neuro: AAOx4. No apparent cognitive deficits    Neurologic Exam:   Sensory exam: Sensation intact to light touch throughout LLE; no further hyperalgesia Motor exam: AROM limited L ankle 0/5 DF, 1/5 PF, and 1/5  toes.  Coordination: Fine motor coordination was normal.     MSK: + L 2-4th hammertoes; mild erythema on 3rd toe with capillary refill at <10 seconds + L calf, foot atrophy  unchanged + R hip and knee >1 inch superior to the right - unchanged   Gait: Mild outtoeing of RLE> LLE with ambulation, no longer antalgic, stable without AD!     Assessment & Plan:   Shawn Harper is a 27 y.o. year old male  who  has a past medical history of Anxiety, Depression, Dysrhythmia, Headache, and Smoker.   They are presenting to PM&R clinic as a  follow up for L foot pain s/p  L foot double arthrodesis and resection July 2023; suspected CRPS however this is less bothersome then localized joint pain; symptoms much improved s/p hardware removal 03/28/23.   Osteoarthritis of left ankle and foot CRPS-like symptoms resolved s/p hardware removal; doing MUCH better functionally and with improved quality of life!  Refilled gabapentin 300 mg BID and meloxicam 7.5 mg BID  BMP ordered today for renal function monitorring, if stable will get every 6-12 months while on daily meloxicam  Follow up in 6 months, if things are still going well that appointment we will move to PRN  Leg length discrepancy Abnormality of gait and mobility Continue PT, is getting benefit with gait stability and pain control through his leg  Continues to benefit from show inserts  Hammertoe of left foot  Discussed possible hammertoe surgery, monitorring for nonblancheable erythema on toes and management of red areas with pressure offloading and barrier dressings if needed to prevent wounds

## 2023-07-29 ENCOUNTER — Ambulatory Visit: Payer: Medicaid Other

## 2023-07-29 DIAGNOSIS — M25572 Pain in left ankle and joints of left foot: Secondary | ICD-10-CM | POA: Diagnosis not present

## 2023-07-29 DIAGNOSIS — M6281 Muscle weakness (generalized): Secondary | ICD-10-CM

## 2023-07-29 DIAGNOSIS — R600 Localized edema: Secondary | ICD-10-CM

## 2023-07-29 DIAGNOSIS — R2689 Other abnormalities of gait and mobility: Secondary | ICD-10-CM

## 2023-07-29 NOTE — Therapy (Signed)
OUTPATIENT PHYSICAL THERAPY TREATMENT   Patient Name: Shawn Harper MRN: [id number] DOB:02/10/1996, 27 y.o., male Today's Date: 07/29/2023  END OF SESSION:  PT End of Session - 07/29/23 0935     Visit Number 12    Number of Visits 20    Date for PT Re-Evaluation 08/12/23    Authorization Type Manuel [employer] [facility] [employer]    PT Start Time 0936   arrived late   PT Stop Time 1014    PT Time Calculation (min) 38 min                     Past Medical History:  Diagnosis Date   Anxiety    Depression    Dysrhythmia    Headache    Smoker    5 pack years, trying to quit   Past Surgical History:  Procedure Laterality Date   APPLICATION OF WOUND VAC  03/08/2022   Procedure: APPLICATION OF WOUND VAC;  Surgeon: Edwin Cap, DPM;  Location: [facility];  Service: Podiatry;;   FLAT FOOT RECONSTRUCTION-TAL GASTROC RECESSION Left 03/08/2022   Procedure: FLAT FOOT RECONSTRUCTION WITH FUSION OF JOINTS IN BACK OF FOOT; POSSIBLE CALF MUSCLE LENGTHENING;BONEGRAFT INTO FOOT TO BUILD ARCH; BONEGRAFT FROM LEG;  Surgeon: Edwin Cap, DPM;  Location: [facility];  Service: Podiatry;  Laterality: Left;  POPLITEL AND SAPHENOUS BLOCK   LEG SURGERY     within a month of birth, in cast for infancy   Patient Active Problem List   Diagnosis Date Noted   Hammertoe of left foot 07/28/2023   Chronic pain syndrome 01/08/2023   Complex regional pain syndrome type 2 of left lower extremity 01/08/2023   Leg length discrepancy 01/08/2023   Abnormality of gait and mobility 01/08/2023   Coalition, talocalcaneal    Osteoarthritis of left ankle and foot    Smoker 01/28/2021   Unprotected sex 10/07/2014   GAD (generalized anxiety disorder) 10/07/2014   Depression 10/07/2014   Former smoker     PCP: Shelva Majestic, MD  REFERRING PROVIDER: Edwin Cap, DPM  REFERRING DIAG:  [id number] (ICD-10-CM) - Pain due to internal orthopedic prosthetic devices, implants and grafts, initial encounter (HCC) M77.52  (ICD-10-CM) - Bone spur of left foot  THERAPY DIAG:  Pain in left ankle and joints of left foot  Muscle weakness (generalized)  Other abnormalities of gait and mobility  Localized edema  Rationale for Evaluation and Treatment: Rehabilitation  ONSET DATE: 03/28/2023  SUBJECTIVE:   SUBJECTIVE STATEMENT: Pt presents to PT with reports of continued pain, although much less than last few sessions. Has been compliant with HEP.   PERTINENT HISTORY: Previous L foot surgery; Anxiety and Depression  PAIN:  Are you having pain?  Yes: NPRS scale: 2/10 Worst: 8/10 Pain location: L foot Pain description: sharp, NT Aggravating factors: Standing Relieving factors: Rest, ice  PRECAUTIONS: None  RED FLAGS: None   WEIGHT BEARING RESTRICTIONS: Yes - WBAT  FALLS:  Has patient fallen in last 6 months? No  LIVING ENVIRONMENT: Lives with: lives with their family Lives in: House/apartment  OCCUPATION: Door Comptroller  PLOF: Independent  PATIENT GOALS: decrease foot pain, get back to golfing and recreational activity without pain  NEXT MD VISIT: 05/21/2023  OBJECTIVE:   DIAGNOSTIC FINDINGS: See imaging  PATIENT SURVEYS:  FOTO: 43% function; 62% predicted  COGNITION: Overall cognitive status: Within functional limits for tasks assessed     SENSATION: Light touch: Impaired - L distal foot  POSTURE: No  Significant postural limitations  PALPATION: Warmth noted at surgical site on medial ankle  LOWER EXTREMITY ROM:  Active ROM Right eval Left eval Left  06/17/23  Hip flexion   5  Hip extension   4  Hip abduction   4  Hip adduction     Hip internal rotation     Hip external rotation     Knee flexion     Knee extension     Ankle dorsiflexion WNL 2   Ankle plantarflexion     Ankle inversion     Ankle eversion      (Blank rows = not tested)  LOWER EXTREMITY MMT:  MMT Right eval Left eval Left 05/17/23  Hip flexion     Hip extension     Hip abduction      Hip adduction     Hip internal rotation     Hip external rotation     Knee flexion     Knee extension     Ankle dorsiflexion WNL 4/5   Ankle plantarflexion WNL 3+/5 Unable to single heel raise  Ankle inversion WNL 2+/5   Ankle eversion WNL 2+/5    (Blank rows = not tested)  LOWER EXTREMITY SPECIAL TESTS:  DNT  FUNCTIONAL TESTS:  SLS: 3 seconds L 07/02/2023: 30 seconds L   GAIT: Distance walked: 40ft Assistive device utilized: None Level of assistance: Complete Independence Comments: slight antalgic gait on L   TREATMENT: [facility] Adult PT Treatment:                                                DATE: 07/29/23 Therapeutic Exercise: Rec bike lvl 4 x 3 min while taking subjective Slant board calf stretch 2x45"  Wobble board fwd/bwd 2x60" SLS on foam 2x30" each Kickstand KB cross 15# 2x30" L stance Standing heel raise 2x20 TRX squat 2x15 TRX lung L front x 10 Tandem walk on foam beam x 4 laps Side stepping on foam beam x 2 laps Single leg press 2x10 75# Standing hip abd 2x10 37.5# Step up 12in step x 10 15# DB each hand Lateral step up x 10 12in   [facility] Adult PT Treatment:                                                DATE: 07/17/23 Therapeutic Exercise: Rec bike lvl 4 x 3 min while taking subjective Slant board calf stretch 2x45"  Wobble board fwd/bwd 2x60" SLS on foam 2x30" each Kickstand KB cross 15# x 45" each Standing heel raise 2x20 TRX squat 2x15 TRX lung L front x 10 Tandem walk on foam beam x 4 laps Side stepping on foam beam x 2 laps Single leg press 2x10 75# Standing hip abd 2x10 37.5# Step up 12in step x 10 10# KB each hand Lateral step up x 10 12in   [facility] Adult PT Treatment:                                                DATE: 07/02/23 Therapeutic Exercise: Rec bike lvl 3 x 3 min while  taking subjective Slant board calf stretch 2x45"  Wobble board fwd/bwd 2x45" Tandem on foam x 30" each SLS on foam x 30" each Kickstand KB cross 15# x 45"  each Standing heel raise 2x20 Eccentric L heel lowering 2x10 4in TRX squat 2x10 Tandem walk on foam beam x 4 laps Step up with 10# KB in ea hand 2x10 fwd with march 8in step Single leg press 2x10 75# Standard lunge x 10 L fwd - R unable Standng hip abd 2x10 37.5#  [facility] Adult PT Treatment:                                                DATE: 06/26/23 Therapeutic Exercise: Rec bike lvl 3 x 3 min while taking subjective Slant board calf stretch 2x45"  Wobble board fwd/bwd 2x45" Standing heel raise 2x20 Eccentric L heel lowering 2x10 4in Tandem on foam 2x30" Kickstand KB cross 10# 2x30" Tandem walk on foam beam x 4 laps Step up with 10# KB in ea hand 2x10 fwd with march 8in step Single leg press 3x10 70# Standng hip abd 2x10 37.5# TRX squat 2x10  [facility] Adult PT Treatment:                                                DATE: 06/24/23 Therapeutic Exercise: NuStep lvl 6 LE only x 3 min while taking subjective Slant board calf stretch 2x45"  Wobble board fwd/bwd 2x30" Standing heel raise 2x20 Eccentric L heel lowering 2x10 4in Tandem on foam 2x30" Kickstand KB cross 10# 2x30" Step up with 10# KB in ea hand 2x10 fwd with march 8in step Single leg press 3x10 65# L ankle inv/ev 2x15 GTB L single leg bridge 2x10 each  PATIENT EDUCATION:  Education details: continue HEP Person educated: Patient Education method: Explanation, Demonstration, and Handouts Education comprehension: verbalized understanding and returned demonstration  HOME EXERCISE PROGRAM: Access Code: [email or web address] URL: [email or web address] Date: 05/15/2023 Prepared by: Edwinna Areola  Exercises - Gastroc Stretch on Wall  - 1 x daily - 7 x weekly - 2-3 reps - 30 sec hold - Long Sitting Calf Stretch with Strap  - 1 x daily - 7 x weekly - 2 reps - 30 sec hold - Ankle Dorsiflexion with Resistance  - 1 x daily - 7 x weekly - 3 sets - 10 reps - red band hold - Ankle Inversion Eversion Towel Slide  - 1 x daily  - 7 x weekly - 3 sets - 10 reps - Standing Tandem Balance with Counter Support  - 1 x daily - 7 x weekly - 2-3 reps - 30 sec hold - Ankle and Toe Plantarflexion with Resistance  - 1 x daily - 7 x weekly - 3 sets - 15 reps - black band hold  ASSESSMENT:  CLINICAL IMPRESSION: Pt was able to complete all prescribed exercises with good tolerance to progression. Therapy continued to work on L ankle motion and strength with progression of squatting and other functional activity. Pt continues to benefit from skilled PT services, will continue per POC as prescribed.   OBJECTIVE IMPAIRMENTS: Abnormal gait, decreased activity tolerance, decreased balance, decreased mobility, difficulty walking, decreased ROM, decreased strength, and pain   ACTIVITY LIMITATIONS: standing, squatting, stairs,  transfers, and locomotion level  PARTICIPATION LIMITATIONS: driving, shopping, community activity, occupation, and yard work  PERSONAL FACTORS: 1-2 comorbidities: Previous L foot surgery; Anxiety and Depression  are also affecting patient's functional outcome.   REHAB POTENTIAL: Excellent  CLINICAL DECISION MAKING: Stable/uncomplicated  EVALUATION COMPLEXITY: Low   GOALS: Goals reviewed with patient? No  SHORT TERM GOALS: Target date: 05/27/2023   Pt will be compliant and knowledgeable with initial HEP for improved comfort and carryover Baseline: initial HEP given  Goal status: MET  2.  Pt will self report left foot pain no greater than 5/10 for improved comfort and functional ability Baseline: 8/10 at worst Goal status: MET   LONG TERM GOALS: Target date: 08/12/2023   Pt will improve FOTO function score to no less than 62% as proxy for functional improvement with home ADLs and community activities Baseline: 43% function Goal status: IN PROGRESS   2.  Pt will self report left foot pain no greater than 1-2/10 for improved comfort and functional ability Baseline: 8/10 at worst Goal status: IN  PROGRESS   3.  Pt will improve L SLS time to no less than 30 seconds for improved stability and ankle proprioception for decreasing pain Baseline: 3 seconds 07/02/2023: 30 seconds Goal status: MET  4.  Pt will improve L ankle DF to no less than 10 degrees past neutral for improving gait and decreasing foot/ankle pain Baseline: 2 degrees Goal status: INITIAL  5.  Pt will improve all L ankle MMT to no less than 4/5 for all tested motions for improving comfort and functional ability with home ADLs and community activities Baseline: see MMT chart Goal status: IN PROGRESS   PLAN:  PT FREQUENCY: 2x/week  PT DURATION: 8 weeks  PLANNED INTERVENTIONS: Therapeutic exercises, Therapeutic activity, Neuromuscular re-education, Balance training, Gait training, Patient/Family education, Self Care, Joint mobilization, Dry Needling, Electrical stimulation, Cryotherapy, Moist heat, Vasopneumatic device, Manual therapy, and Re-evaluation  PLAN FOR NEXT SESSION: assess HEP response, ankle strengthening, balance, ankle ROM   Eloy End PT  07/29/23 10:15 AM

## 2023-07-31 ENCOUNTER — Ambulatory Visit: Payer: Medicaid Other

## 2023-07-31 DIAGNOSIS — R2689 Other abnormalities of gait and mobility: Secondary | ICD-10-CM

## 2023-07-31 DIAGNOSIS — M25572 Pain in left ankle and joints of left foot: Secondary | ICD-10-CM | POA: Diagnosis not present

## 2023-07-31 DIAGNOSIS — M6281 Muscle weakness (generalized): Secondary | ICD-10-CM

## 2023-07-31 NOTE — Therapy (Addendum)
 OUTPATIENT PHYSICAL THERAPY TREATMENT NOTE/DISCHARGE  PHYSICAL THERAPY DISCHARGE SUMMARY  Visits from Start of Care: 13  Current functional level related to goals / functional outcomes: See goals/objective   Remaining deficits: Unable to assess   Education / Equipment: HEP   Patient agrees to discharge. Patient goals were unable to assess. Patient is being discharged due to not returning since the last visit.     Patient Name: Shawn Harper MRN: [id number] DOB:October 07, 1995, 27 y.o., male Today's Date: 07/31/2023  END OF SESSION:  PT End of Session - 07/31/23 0937     Visit Number 13    Number of Visits 20    Date for PT Re-Evaluation 08/12/23    Authorization Type  [facility] [employer]    PT Start Time 0935    PT Stop Time 1014    PT Time Calculation (min) 39 min                      Past Medical History:  Diagnosis Date   Anxiety    Depression    Dysrhythmia    Headache    Smoker    5 pack years, trying to quit   Past Surgical History:  Procedure Laterality Date   APPLICATION OF WOUND VAC  03/08/2022   Procedure: APPLICATION OF WOUND VAC;  Surgeon: Silva Juliene SAUNDERS, DPM;  Location: [facility];  Service: Podiatry;;   FLAT FOOT RECONSTRUCTION-TAL GASTROC RECESSION Left 03/08/2022   Procedure: FLAT FOOT RECONSTRUCTION WITH FUSION OF JOINTS IN BACK OF FOOT; POSSIBLE CALF MUSCLE LENGTHENING;BONEGRAFT INTO FOOT TO BUILD ARCH; BONEGRAFT FROM LEG;  Surgeon: Silva Juliene SAUNDERS, DPM;  Location: [facility];  Service: Podiatry;  Laterality: Left;  POPLITEL AND SAPHENOUS BLOCK   LEG SURGERY     within a month of birth, in cast for infancy   Patient Active Problem List   Diagnosis Date Noted   Hammertoe of left foot 07/28/2023   Chronic pain syndrome 01/08/2023   Complex regional pain syndrome type 2 of left lower extremity 01/08/2023   Leg length discrepancy 01/08/2023   Abnormality of gait and mobility 01/08/2023   Coalition, talocalcaneal    Osteoarthritis of left ankle  and foot    Smoker 01/28/2021   Unprotected sex 10/07/2014   GAD (generalized anxiety disorder) 10/07/2014   Depression 10/07/2014   Former smoker     PCP: Katrinka Garnette KIDD, MD  REFERRING PROVIDER: Silva Juliene SAUNDERS, DPM  REFERRING DIAG:  [phone] (ICD-10-CM) - Pain due to internal orthopedic prosthetic devices, implants and grafts, initial encounter (HCC) M77.52 (ICD-10-CM) - Bone spur of left foot  THERAPY DIAG:  Pain in left ankle and joints of left foot  Muscle weakness (generalized)  Other abnormalities of gait and mobility  Rationale for Evaluation and Treatment: Rehabilitation  ONSET DATE: 03/28/2023  SUBJECTIVE:   SUBJECTIVE STATEMENT: Pt presents to PT with reports of increased pain in L foot after walking a lot the previous day.   PERTINENT HISTORY: Previous L foot surgery; Anxiety and Depression  PAIN:  Are you having pain?  Yes: NPRS scale: 8/10 Worst: 8/10 Pain location: L foot Pain description: sharp, NT Aggravating factors: Standing Relieving factors: Rest, ice  PRECAUTIONS: None  RED FLAGS: None   WEIGHT BEARING RESTRICTIONS: Yes - WBAT  FALLS:  Has patient fallen in last 6 months? No  LIVING ENVIRONMENT: Lives with: lives with their family Lives in: House/apartment  OCCUPATION: Door Comptroller  PLOF: Independent  PATIENT GOALS: decrease foot pain,  get back to golfing and recreational activity without pain  NEXT MD VISIT: 05/21/2023  OBJECTIVE:   DIAGNOSTIC FINDINGS: See imaging  PATIENT SURVEYS:  FOTO: 43% function; 62% predicted  COGNITION: Overall cognitive status: Within functional limits for tasks assessed     SENSATION: Light touch: Impaired - L distal foot  POSTURE: No Significant postural limitations  PALPATION: Warmth noted at surgical site on medial ankle  LOWER EXTREMITY ROM:  Active ROM Right eval Left eval Left  06/17/23  Hip flexion   5  Hip extension   4  Hip abduction   4  Hip adduction     Hip  internal rotation     Hip external rotation     Knee flexion     Knee extension     Ankle dorsiflexion WNL 2   Ankle plantarflexion     Ankle inversion     Ankle eversion      (Blank rows = not tested)  LOWER EXTREMITY MMT:  MMT Right eval Left eval Left 05/17/23  Hip flexion     Hip extension     Hip abduction     Hip adduction     Hip internal rotation     Hip external rotation     Knee flexion     Knee extension     Ankle dorsiflexion WNL 4/5   Ankle plantarflexion WNL 3+/5 Unable to single heel raise  Ankle inversion WNL 2+/5   Ankle eversion WNL 2+/5    (Blank rows = not tested)  LOWER EXTREMITY SPECIAL TESTS:  DNT  FUNCTIONAL TESTS:  SLS: 3 seconds L 07/02/2023: 30 seconds L   GAIT: Distance walked: 45ft Assistive device utilized: None Level of assistance: Complete Independence Comments: slight antalgic gait on L   TREATMENT: [facility] Adult PT Treatment:                                                DATE: 07/31/23 Therapeutic Exercise: Rec bike lvl 2 x 3 min while taking subjective Slant board calf stretch 2x45  Ankle DF/inv/ev 2x15 GTB L calf stretch with strap 2x45 Single leg press 3x10 75# Wobble board fwd/bwd 2x60 SLS on foam 2x30 L Kickstand KB cross 15# 2x30 L stance Seated heel raise on omega L 2x15 20# Seated hamstring curl 2x10 45# Seated knee ext 2x10 65#  [facility] Adult PT Treatment:                                                DATE: 07/29/23 Therapeutic Exercise: Rec bike lvl 4 x 3 min while taking subjective Slant board calf stretch 2x45  Wobble board fwd/bwd 2x60 SLS on foam 2x30 each Kickstand KB cross 15# 2x30 L stance Standing heel raise 2x20 TRX squat 2x15 TRX lung L front x 10 Tandem walk on foam beam x 4 laps Side stepping on foam beam x 2 laps Single leg press 2x10 75# Standing hip abd 2x10 37.5# Step up 12in step x 10 15# DB each hand Lateral step up x 10 12in   [facility] Adult PT Treatment:  DATE: 07/17/23 Therapeutic Exercise: Rec bike lvl 4 x 3 min while taking subjective Slant board calf stretch 2x45  Wobble board fwd/bwd 2x60 SLS on foam 2x30 each Kickstand KB cross 15# x 45 each Standing heel raise 2x20 TRX squat 2x15 TRX lung L front x 10 Tandem walk on foam beam x 4 laps Side stepping on foam beam x 2 laps Single leg press 2x10 75# Standing hip abd 2x10 37.5# Step up 12in step x 10 10# KB each hand Lateral step up x 10 12in   [facility] Adult PT Treatment:                                                DATE: 07/02/23 Therapeutic Exercise: Rec bike lvl 3 x 3 min while taking subjective Slant board calf stretch 2x45  Wobble board fwd/bwd 2x45 Tandem on foam x 30 each SLS on foam x 30 each Kickstand KB cross 15# x 45 each Standing heel raise 2x20 Eccentric L heel lowering 2x10 4in TRX squat 2x10 Tandem walk on foam beam x 4 laps Step up with 10# KB in ea hand 2x10 fwd with march 8in step Single leg press 2x10 75# Standard lunge x 10 L fwd - R unable Standng hip abd 2x10 37.5#  [facility] Adult PT Treatment:                                                DATE: 06/26/23 Therapeutic Exercise: Rec bike lvl 3 x 3 min while taking subjective Slant board calf stretch 2x45  Wobble board fwd/bwd 2x45 Standing heel raise 2x20 Eccentric L heel lowering 2x10 4in Tandem on foam 2x30 Kickstand KB cross 10# 2x30 Tandem walk on foam beam x 4 laps Step up with 10# KB in ea hand 2x10 fwd with march 8in step Single leg press 3x10 70# Standng hip abd 2x10 37.5# TRX squat 2x10  [facility] Adult PT Treatment:                                                DATE: 06/24/23 Therapeutic Exercise: NuStep lvl 6 LE only x 3 min while taking subjective Slant board calf stretch 2x45  Wobble board fwd/bwd 2x30 Standing heel raise 2x20 Eccentric L heel lowering 2x10 4in Tandem on foam 2x30 Kickstand KB cross 10# 2x30 Step up with 10# KB in ea hand 2x10  fwd with march 8in step Single leg press 3x10 65# L ankle inv/ev 2x15 GTB L single leg bridge 2x10 each  PATIENT EDUCATION:  Education details: continue HEP Person educated: Patient Education method: Explanation, Demonstration, and Handouts Education comprehension: verbalized understanding and returned demonstration  HOME EXERCISE PROGRAM: Access Code: [email or web address] URL: [email or web address] Date: 05/15/2023 Prepared by: Alm Kingdom  Exercises - Gastroc Stretch on Wall  - 1 x daily - 7 x weekly - 2-3 reps - 30 sec hold - Long Sitting Calf Stretch with Strap  - 1 x daily - 7 x weekly - 2 reps - 30 sec hold - Ankle Dorsiflexion with Resistance  - 1 x daily - 7 x weekly - 3 sets -  10 reps - red band hold - Ankle Inversion Eversion Towel Slide  - 1 x daily - 7 x weekly - 3 sets - 10 reps - Standing Tandem Balance with Counter Support  - 1 x daily - 7 x weekly - 2-3 reps - 30 sec hold - Ankle and Toe Plantarflexion with Resistance  - 1 x daily - 7 x weekly - 3 sets - 15 reps - black band hold  ASSESSMENT:  CLINICAL IMPRESSION: Pt was able to complete all prescribed exercises with good tolerance, decreased intensity today secondary to increased pain. Therapy continued to work on L ankle motion and strength with progression of squatting and other functional activity. Pt continues to benefit from skilled PT services, will continue per POC as prescribed.   OBJECTIVE IMPAIRMENTS: Abnormal gait, decreased activity tolerance, decreased balance, decreased mobility, difficulty walking, decreased ROM, decreased strength, and pain   ACTIVITY LIMITATIONS: standing, squatting, stairs, transfers, and locomotion level  PARTICIPATION LIMITATIONS: driving, shopping, community activity, occupation, and yard work  PERSONAL FACTORS: 1-2 comorbidities: Previous L foot surgery; Anxiety and Depression are also affecting patient's functional outcome.   REHAB POTENTIAL: Excellent  CLINICAL  DECISION MAKING: Stable/uncomplicated  EVALUATION COMPLEXITY: Low   GOALS: Goals reviewed with patient? No  SHORT TERM GOALS: Target date: 05/27/2023   Pt will be compliant and knowledgeable with initial HEP for improved comfort and carryover Baseline: initial HEP given  Goal status: MET  2.  Pt will self report left foot pain no greater than 5/10 for improved comfort and functional ability Baseline: 8/10 at worst Goal status: MET   LONG TERM GOALS: Target date: 08/12/2023   Pt will improve FOTO function score to no less than 62% as proxy for functional improvement with home ADLs and community activities Baseline: 43% function Goal status: IN PROGRESS   2.  Pt will self report left foot pain no greater than 1-2/10 for improved comfort and functional ability Baseline: 8/10 at worst Goal status: IN PROGRESS   3.  Pt will improve L SLS time to no less than 30 seconds for improved stability and ankle proprioception for decreasing pain Baseline: 3 seconds 07/02/2023: 30 seconds Goal status: MET  4.  Pt will improve L ankle DF to no less than 10 degrees past neutral for improving gait and decreasing foot/ankle pain Baseline: 2 degrees Goal status: INITIAL  5.  Pt will improve all L ankle MMT to no less than 4/5 for all tested motions for improving comfort and functional ability with home ADLs and community activities Baseline: see MMT chart Goal status: IN PROGRESS   PLAN:  PT FREQUENCY: 2x/week  PT DURATION: 8 weeks  PLANNED INTERVENTIONS: Therapeutic exercises, Therapeutic activity, Neuromuscular re-education, Balance training, Gait training, Patient/Family education, Self Care, Joint mobilization, Dry Needling, Electrical stimulation, Cryotherapy, Moist heat, Vasopneumatic device, Manual therapy, and Re-evaluation  PLAN FOR NEXT SESSION: assess HEP response, ankle strengthening, balance, ankle ROM   Alm JAYSON Kingdom PT  07/31/23 10:17 AM

## 2023-08-05 ENCOUNTER — Ambulatory Visit: Payer: Medicaid Other

## 2023-08-07 ENCOUNTER — Ambulatory Visit: Payer: Medicaid Other

## 2023-08-11 ENCOUNTER — Telehealth: Payer: Self-pay | Admitting: Internal Medicine

## 2023-08-11 NOTE — Telephone Encounter (Signed)
Returned the patient's phone call. The Suprep Rx was sent to his pharmacy and is being filled. He reports that he has been taking Miralax 1 capful daily for 5 days but is still constipated. Encouraged him to pick up a box of Dulcolax and take that at 3 pm today prior to starting his Suprep at 6 tonight and that if he is not having any results to call the on call physician this evening. He verbalized understanding.

## 2023-08-11 NOTE — Telephone Encounter (Signed)
Patient requesting to speak with a nurse in regards to prep for tomorrows procedure. Please advise.

## 2023-08-11 NOTE — Telephone Encounter (Signed)
Pt called stating he has been eating all day, want to rescheduled.  Rescheduled to 10-02-22 at 1:30 and PV is 09-25-22 at 800 am

## 2023-08-11 NOTE — Telephone Encounter (Signed)
Inbound call from patient requesting a call to discuss prep medication. States he has not received medication for tomorrow's 12/31 colonoscopy. Please advise, thank you.

## 2023-08-12 ENCOUNTER — Ambulatory Visit: Payer: Medicaid Other

## 2023-08-12 ENCOUNTER — Encounter: Payer: Medicaid Other | Admitting: Internal Medicine

## 2023-09-26 ENCOUNTER — Ambulatory Visit: Payer: Medicaid Other

## 2023-09-26 VITALS — Ht 74.0 in | Wt 175.0 lb

## 2023-09-26 DIAGNOSIS — K625 Hemorrhage of anus and rectum: Secondary | ICD-10-CM

## 2023-09-26 DIAGNOSIS — Z83719 Family history of colon polyps, unspecified: Secondary | ICD-10-CM

## 2023-09-26 MED ORDER — SUFLAVE 178.7 G PO SOLR: 1.0000 | Freq: Once | ORAL | 0 refills | Status: AC

## 2023-09-26 NOTE — Progress Notes (Signed)
No egg or soy allergy known to patient  No issues known to pt with past sedation with any surgeries or procedures Patient denies ever being told they had issues or difficulty with intubation  No FH of Malignant Hyperthermia Pt is not on diet pills Pt is not on  home 02  Pt is not on blood thinners  Pt denies has with constipation takes docusate No A fib or A flutter Have any cardiac testing pending--no Pt can ambulate independently Pt denies use of chewing tobacco Discussed diabetic I weight loss medication holds Discussed NSAID holds Checked BMI Pt instructed to use [employer] or GoodRx for a price reduction on prep  Patient's chart reviewed by Cathlyn Parsons CNRA prior to previsit and patient appropriate for the [facility].  Pre visit completed and red dot placed by patient's name on their procedure day (on provider's schedule).

## 2023-10-03 ENCOUNTER — Telehealth: Payer: Self-pay | Admitting: Internal Medicine

## 2023-10-03 ENCOUNTER — Encounter: Payer: Medicaid Other | Admitting: Internal Medicine

## 2023-10-03 NOTE — Telephone Encounter (Addendum)
Good morning Dr. Marina Goodell I received a call from this patient and he stated that unfortunately his ride cancelled on him and is now having to cancel his procedure. Would you please advise.  Thank you

## 2023-10-03 NOTE — Telephone Encounter (Signed)
He should reschedule when he has reliable transportation.  Thanks

## 2023-11-07 ENCOUNTER — Other Ambulatory Visit (HOSPITAL_COMMUNITY)
Admission: RE | Admit: 2023-11-07 | Discharge: 2023-11-07 | Disposition: A | Discharge status: Home / Self Care | Source: Ambulatory Visit | Attending: Family Medicine | Admitting: Family Medicine

## 2023-11-07 ENCOUNTER — Encounter: Payer: Self-pay | Admitting: Family Medicine

## 2023-11-07 ENCOUNTER — Ambulatory Visit (INDEPENDENT_AMBULATORY_CARE_PROVIDER_SITE_OTHER): Admitting: Family Medicine

## 2023-11-07 VITALS — BP 104/60 | HR 74 | Temp 97.4°F | Ht 74.0 in | Wt 171.0 lb

## 2023-11-07 DIAGNOSIS — Z118 Encounter for screening for other infectious and parasitic diseases: Secondary | ICD-10-CM | POA: Diagnosis present

## 2023-11-07 DIAGNOSIS — F411 Generalized anxiety disorder: Secondary | ICD-10-CM

## 2023-11-07 DIAGNOSIS — Z114 Encounter for screening for human immunodeficiency virus [HIV]: Secondary | ICD-10-CM

## 2023-11-07 DIAGNOSIS — K625 Hemorrhage of anus and rectum: Secondary | ICD-10-CM

## 2023-11-07 DIAGNOSIS — Z113 Encounter for screening for infections with a predominantly sexual mode of transmission: Secondary | ICD-10-CM

## 2023-11-07 DIAGNOSIS — F32A Depression, unspecified: Secondary | ICD-10-CM

## 2023-11-07 LAB — COMPREHENSIVE METABOLIC PANEL WITH GFR
ALT: 11 U/L (ref 0–53)
AST: 15 U/L (ref 0–37)
Albumin: 4.9 g/dL (ref 3.5–5.2)
Alkaline Phosphatase: 59 U/L (ref 39–117)
BUN: 19 mg/dL (ref 6–23)
CO2: 31 meq/L (ref 19–32)
Calcium: 9.7 mg/dL (ref 8.4–10.5)
Chloride: 102 meq/L (ref 96–112)
Creatinine, Ser: 1.07 mg/dL (ref 0.40–1.50)
GFR: 93.67 mL/min (ref >60.00)
Glucose, Bld: 70 mg/dL (ref 70–99)
Potassium: 4.2 meq/L (ref 3.5–5.1)
Sodium: 139 meq/L (ref 135–145)
Total Bilirubin: 0.7 mg/dL (ref 0.2–1.2)
Total Protein: 7.1 g/dL (ref 6.0–8.3)

## 2023-11-07 LAB — CBC WITH DIFFERENTIAL/PLATELET
Basophils Absolute: 0 10*3/uL (ref 0.0–0.1)
Basophils Relative: 0.9 % (ref 0.0–3.0)
Eosinophils Absolute: 0.1 10*3/uL (ref 0.0–0.7)
Eosinophils Relative: 2.4 % (ref 0.0–5.0)
HCT: 42.8 % (ref 39.0–52.0)
Hemoglobin: 14.7 g/dL (ref 13.0–17.0)
Lymphocytes Relative: 37.4 % (ref 12.0–46.0)
Lymphs Abs: 1.9 10*3/uL (ref 0.7–4.0)
MCHC: 34.4 g/dL (ref 30.0–36.0)
MCV: 93 fl (ref 78.0–100.0)
Monocytes Absolute: 0.4 10*3/uL (ref 0.1–1.0)
Monocytes Relative: 7.3 % (ref 3.0–12.0)
Neutro Abs: 2.6 10*3/uL (ref 1.4–7.7)
Neutrophils Relative %: 52 % (ref 43.0–77.0)
Platelets: 347 10*3/uL (ref 150.0–400.0)
RBC: 4.6 Mil/uL (ref 4.22–5.81)
RDW: 12.1 % (ref 11.5–15.5)
WBC: 5 10*3/uL (ref 4.0–10.5)

## 2023-11-07 NOTE — Progress Notes (Signed)
 Phone [phone] In person visit   Subjective:   Shawn Harper is a 28 y.o. year old very pleasant male patient who presents for/with See problem oriented charting Chief Complaint  Patient presents with   STD testing    Pt would like std testing as precaution.    Past Medical History-  Patient Active Problem List   Diagnosis Date Noted   GAD (generalized anxiety disorder) 10/07/2014    Priority: Medium    Depression 10/07/2014    Priority: Medium    Unprotected sex 10/07/2014    Priority: Low   Former smoker     Priority: Low   Hammertoe of left foot 07/28/2023   Chronic pain syndrome 01/08/2023   Complex regional pain syndrome type 2 of left lower extremity 01/08/2023   Leg length discrepancy 01/08/2023   Abnormality of gait and mobility 01/08/2023   Coalition, talocalcaneal    Osteoarthritis of left ankle and foot    Smoker 01/28/2021    Medications- reviewed and updated Current Outpatient Medications  Medication Sig Dispense Refill   DULoxetine (CYMBALTA) 60 MG capsule Take 60 mg by mouth 2 (two) times daily.     gabapentin (NEURONTIN) 300 MG capsule Take 1 capsule (300 mg total) by mouth 2 (two) times daily. 120 capsule 5   hydrOXYzine (ATARAX) 10 MG tablet Take 10 mg by mouth daily.     meloxicam (MOBIC) 7.5 MG tablet Start with one tablet in the morning. If no side effects after 1 week, you can increase it to 2 tablets in the morning. Do not take with other over the counter NSAIDs like ibuprofen; you can take this with tylenol. 60 tablet 5   VITAMIN D PO Take 1 tablet by mouth daily.     No current facility-administered medications for this visit.     Objective:  BP 104/60   Pulse 74   Temp (!) 97.4 F (36.3 C)   Ht 6\' 2"  (1.88 m)   Wt 171 lb (77.6 kg)   SpO2 97%   BMI 21.96 kg/m  Gen: NAD, resting comfortably CV: RRR no murmurs rubs or gallops Lungs: CTAB no crackles, wheeze, rhonchi Abdomen: soft/nontender/nondistended/normal bowel sounds. No  rebound or guarding.  Ext: no edema Skin: warm, dry    Assessment and Plan   # STD testing/screening S: At last visit patient reported only being active with his fiance and opted out of STD screening.  Today reports they did have a short term split and are back together- had one other partner unprotected during that time so wants to be safe and get tested  A/P: STD screening today but no symptoms- syphilis, HIV, gonorrhea, chlamydia and trichomonas. No visual changes for herpes or warts reported. Had regular childhood immunizations os is immunized against hepatis B  # Depression and anxiety- working with [employer]- has new therapist pending S: Medication:Duloxetine 60 mg twice daily, gabapentin 300 mg twice daily, hydroxyzine 10 mg daily. Unfortunately his pain has worsened in his ankle which is a barrier -also having attention deficit disorder evaluation with psychiatry    11/07/2023   11:17 AM 07/28/2023   11:25 AM 02/27/2023   10:56 AM  Depression screen PHQ 2/9  Decreased Interest 2 3 1   Down, Depressed, Hopeless 1 3 1   PHQ - 2 Score 3 6 2   Altered sleeping 1  2  Tired, decreased energy 1  3  Change in appetite 2  3  Feeling bad or failure about yourself  0  0  Trouble concentrating 0  3  Moving slowly or fidgety/restless 0  0  Suicidal thoughts 0  0  PHQ-9 Score 7  13  Difficult doing work/chores Somewhat difficult  Somewhat difficult  A/P: Patient reports depression and anxiety is improving under the care of of [facility] and with current duloxetine and gabapentin and hydroxyzine-continue current medication and we are hoping that therapy helps some further-pending visit with new provider.  Does have some issues related to ongoing ankle pain and he plans to schedule follow-up to discuss that  #Rectal bleeding -  S: ongoing issues on daily basis especially if strains. Has 2 colonoscopies scheduled but had a conflict and had to cancel- is agreeable to call back to schedule - was not  anemic on last check but with ongoing issues is willing to have that checked along with other bloodwork. Family history of mom with large polyp at age 35 so really needs to get this done.  -has noted protruding hemorrhoids or external hemorrhoids- may be able to ask about banding for internal ones -No chest pain or shortness of breath reported with activity or at rest Lab Results  Component Value Date   WBC 5.5 01/03/2022   HGB 14.8 01/03/2022   HCT 42.3 01/03/2022   MCV 93.9 01/03/2022   PLT 258.0 01/03/2022  A/P: Ongoing rectal bleeding and patient reports hemorrhoids (declines exam today).  Family history of early colon polyps-encouraged patient to schedule once again with GI as I have previously and discussed risks of colon cancer being missed specifically or worsening due to delay  Recommended follow up: Return for next already scheduled visit or sooner if needed. Future Appointments  Date Time Provider Department Center  12/02/2023  9:15 AM Edwin Cap, DPM [facility]  01/19/2024  9:00 AM Angelina Sheriff, DO [facility]  03/01/2024  9:00 AM Shelva Majestic, MD [facility]    Lab/Order associations:   ICD-10-CM   1. Bright red blood per rectum  K62.5 Comprehensive metabolic panel with GFR    CBC with Differential/Platelet    2. Depression, unspecified depression type  F32.A     3. GAD (generalized anxiety disorder)  F41.1     4. Screening for gonorrhea  Z11.3 Urine cytology ancillary only    5. Screening for chlamydial disease  Z11.8 Urine cytology ancillary only    6. Screening for HIV (human immunodeficiency virus)  Z11.4 RPR    7. Screening examination for venereal disease  Z11.3 HIV Antibody (routine testing w rflx)      No orders of the defined types were placed in this encounter.   Return precautions advised.  Tana Conch, MD

## 2023-11-07 NOTE — Patient Instructions (Addendum)
 [facility] contact Please call to schedule visit and/or procedure IF you do not hear within a week Address: [address] [place], [place], [place] [address] Phone: [phone] -very important to get this done with family history and ongoing bleeding to rule out colon cancer in particular  Please stop by lab before you go If you have mychart- we will send your results within 3 business days of [place] receiving them.  If you do not have mychart- we will call you about results within 5 business days of [place] receiving them.  *please also note that you will see labs on mychart as soon as they post. I will later go in and write notes on them- will say "notes from Dr. Durene Cal"   Recommended follow up: Return for next already scheduled visit or sooner if needed.

## 2023-11-08 LAB — HIV ANTIBODY (ROUTINE TESTING W REFLEX): HIV 1&2 Ab, 4th Generation: NONREACTIVE

## 2023-11-08 LAB — RPR: RPR Ser Ql: NONREACTIVE

## 2023-11-10 ENCOUNTER — Encounter: Payer: Self-pay | Admitting: Family Medicine

## 2023-11-10 LAB — URINE CYTOLOGY ANCILLARY ONLY
Chlamydia: NEGATIVE
Comment: NEGATIVE
Comment: NEGATIVE
Comment: NORMAL
Neisseria Gonorrhea: NEGATIVE
Trichomonas: NEGATIVE

## 2023-11-18 ENCOUNTER — Encounter: Payer: Self-pay | Admitting: Internal Medicine

## 2023-12-02 ENCOUNTER — Ambulatory Visit (INDEPENDENT_AMBULATORY_CARE_PROVIDER_SITE_OTHER): Payer: Medicaid Other | Admitting: Podiatry

## 2023-12-02 ENCOUNTER — Ambulatory Visit (INDEPENDENT_AMBULATORY_CARE_PROVIDER_SITE_OTHER)

## 2023-12-02 ENCOUNTER — Encounter: Payer: Self-pay | Admitting: Podiatry

## 2023-12-02 DIAGNOSIS — M7752 Other enthesopathy of left foot: Secondary | ICD-10-CM

## 2023-12-02 DIAGNOSIS — M19072 Primary osteoarthritis, left ankle and foot: Secondary | ICD-10-CM | POA: Diagnosis not present

## 2023-12-02 NOTE — Progress Notes (Signed)
  Subjective:  Patient ID: Shawn Harper, male    DOB: 30-Apr-1996,  MRN: [id number]  Chief Complaint  Patient presents with   Foot Pain    Patient states that he has been having left foot pain his whole life, and is taking medication for pain but nothing is working. Patient would like Cortisone shot in left foot. Patient state that he is having sharp and throbbing pain and 1-10 his pain level is a 10    DOS: 03/28/2023 Procedure: Removal of hardware, excision of talar exostosis  28 y.o. male returns for post-op check.  Pain is increased again its most in the front of the ankle.  Still overall does not have the same type of nerve pain he had before the last surgery  Review of Systems: Negative except as noted in the HPI. Denies N/V/F/Ch.   Objective:   There were no vitals filed for this visit.  There is no height or weight on file to calculate BMI. Constitutional Well developed. Well nourished.  Vascular Foot warm and well perfused. Capillary refill normal to all digits.  Calf is soft and supple, no posterior calf or knee pain, negative Homans' sign  Neurologic Normal speech. Oriented to person, place, and time. Epicritic sensation to light touch grossly present bilaterally.  Dermatologic Incisions are well-healed and not hypertrophic  Orthopedic: Mild tenderness around anterior and lateral ankle joint no pain over the talonavicular joint today or subtalar joint or posterior heel   Radiographs taken today show unchanged alignment maintained consolidation across fusion sites, still has osteochondral defect in tibiotalar joint  Assessment:   1. Bone spur of left foot   2. Arthritis of left ankle    Plan:  Patient was evaluated and treated and all questions answered.  S/p foot surgery left - He returns for follow-up still having pain mostly in the ankle joint today.  No pain in the metatarsal joint lateral ankle or heel.  Previous corticosteroid injection of the ankle was  unsuccessful prior to his last surgery.  I did recommend we trial this again today.  Following consent and sterile prep with Betadine the left ankle was injected in the medial gutter through this approach with 40 mg of Kenalog and 0.5 cc of 0.5% Marcaine  plain.  He tolerated well.  I will see him back in 4 months to reevaluate I discussed with him if his pain is returning within 2 months would recommend we reevaluate with a new advanced studies such as an MRI to better characterize the OCD now that his hardware has been removed.  Return in about 4 months (around 04/02/2024) for injection follow up .

## 2023-12-19 ENCOUNTER — Telehealth: Payer: Self-pay

## 2023-12-19 ENCOUNTER — Encounter

## 2023-12-19 NOTE — Telephone Encounter (Signed)
 No Show pre visit.  Left 2 messages letting the patient know that I had called to complete his pre visit.  Left detailed message for him to call back and reschedule pre visit by end of day or the colonoscopy would be cancelled.

## 2023-12-25 ENCOUNTER — Ambulatory Visit (AMBULATORY_SURGERY_CENTER)

## 2023-12-25 VITALS — Ht 73.0 in | Wt 170.0 lb

## 2023-12-25 DIAGNOSIS — K5903 Drug induced constipation: Secondary | ICD-10-CM

## 2023-12-25 DIAGNOSIS — Z83719 Family history of colon polyps, unspecified: Secondary | ICD-10-CM

## 2023-12-25 NOTE — Progress Notes (Signed)

## 2023-12-29 NOTE — Telephone Encounter (Signed)
 Completed pre visit

## 2024-01-02 ENCOUNTER — Ambulatory Visit (AMBULATORY_SURGERY_CENTER): Admitting: Internal Medicine

## 2024-01-02 ENCOUNTER — Encounter: Payer: Self-pay | Admitting: Internal Medicine

## 2024-01-02 VITALS — BP 102/62 | HR 42 | Temp 97.3°F | Resp 13 | Ht 72.0 in | Wt 170.0 lb

## 2024-01-02 DIAGNOSIS — D122 Benign neoplasm of ascending colon: Secondary | ICD-10-CM

## 2024-01-02 DIAGNOSIS — K648 Other hemorrhoids: Secondary | ICD-10-CM | POA: Diagnosis not present

## 2024-01-02 DIAGNOSIS — K625 Hemorrhage of anus and rectum: Secondary | ICD-10-CM

## 2024-01-02 DIAGNOSIS — Z83719 Family history of colon polyps, unspecified: Secondary | ICD-10-CM

## 2024-01-02 MED ORDER — SODIUM CHLORIDE 0.9 % IV SOLN: 500.0000 mL | Freq: Once | INTRAVENOUS | Status: DC

## 2024-01-02 NOTE — Progress Notes (Signed)
 Pt's states no medical or surgical changes since previsit or office visit.

## 2024-01-02 NOTE — Patient Instructions (Signed)

## 2024-01-02 NOTE — Progress Notes (Signed)
 Sedate, gd SR, tolerated procedure well, VSS, report to RN

## 2024-01-02 NOTE — Progress Notes (Signed)
 Called to room to assist during endoscopic procedure.  Patient ID and intended procedure confirmed with present staff. Received instructions for my participation in the procedure from the performing physician.

## 2024-01-02 NOTE — Op Note (Signed)
  [facility] Patient Name: Shawn Harper Procedure Date: 01/02/2024 2:12 PM MRN: [id number] Endoscopist: Murel Arlington. Elvin Hammer , MD, [id number] Age: 28 Referring MD:  Date of Birth: [birth year] Gender: Male Account #: [id number] Procedure:                Colonoscopy with cold snare polypectomy x 1 Indications:              Rectal bleeding, family history of colon polyps,                            constipation Medicines:                Monitored Anesthesia Care Procedure:                Pre-Anesthesia Assessment:                           - Prior to the procedure, a History and Physical                            was performed, and patient medications and                            allergies were reviewed. The patient's tolerance of                            previous anesthesia was also reviewed. The risks                            and benefits of the procedure and the sedation                            options and risks were discussed with the patient.                            All questions were answered, and informed consent                            was obtained. Prior Anticoagulants: The patient has                            taken no anticoagulant or antiplatelet agents. ASA                            Grade Assessment: I - A normal, healthy patient.                            After reviewing the risks and benefits, the patient                            was deemed in satisfactory condition to undergo the                            procedure.  After obtaining informed consent, the colonoscope                            was passed under direct vision. Throughout the                            procedure, the patient's blood pressure, pulse, and                            oxygen saturations were monitored continuously. The                            CF [id number] #[id number] was introduced through the anus                            and advanced to the the  cecum, identified by                            appendiceal orifice and ileocecal valve. The                            ileocecal valve, appendiceal orifice, and rectum                            were photographed. The quality of the bowel                            preparation was excellent. The colonoscopy was                            performed without difficulty. The patient tolerated                            the procedure well. The bowel preparation used was                            SUPREP via split dose instruction. Scope In: 2:29:11 PM Scope Out: 2:41:26 PM Scope Withdrawal Time: 0 hours 8 minutes 16 seconds  Total Procedure Duration: 0 hours 12 minutes 15 seconds  Findings:                 A 3 mm polyp was found in the ascending colon. The                            polyp was sessile. The polyp was removed with a                            cold snare. Resection and retrieval were complete.                           Internal hemorrhoids were found during                            retroflexion. The hemorrhoids were moderate.  The exam was otherwise without abnormality on                            direct and retroflexion views. Complications:            No immediate complications. Estimated blood loss:                            None. Estimated Blood Loss:     Estimated blood loss: none. Impression:               - One 3 mm polyp in the ascending colon, removed                            with a cold snare. Resected and retrieved.                           - Internal hemorrhoids.                           - The examination was otherwise normal on direct                            and retroflexion views. Recommendation:           - Repeat colonoscopy in 7 years for surveillance if                            polyp adenomatous, otherwise age 63.                           - Patient has a contact number available for                            emergencies.  The signs and symptoms of potential                            delayed complications were discussed with the                            patient. Return to normal activities tomorrow.                            Written discharge instructions were provided to the                            patient.                           - Resume previous diet.                           - Continue present medications.                           - Await pathology results.                           -  Recommend MIRALAX                           - Constipation. Take 1 dose daily as directed. You                            may increase or decrease the dosage to achieve                            desired result Murel Arlington. Elvin Hammer, MD 01/02/2024 2:46:37 PM This report has been signed electronically.

## 2024-01-02 NOTE — Progress Notes (Signed)
 HISTORY OF PRESENT ILLNESS:  Shawn Harper is a 28 y.o. male is referred for colonoscopy due to recurrent rectal bleeding and a family history of colon polyps.  REVIEW OF SYSTEMS:  All non-GI ROS negative except for  Past Medical History:  Diagnosis Date   Anxiety    Arthritis    Depression    Dysrhythmia    Headache    Smoker    5 pack years, trying to quit    Past Surgical History:  Procedure Laterality Date   APPLICATION OF WOUND VAC  03/08/2022   Procedure: APPLICATION OF WOUND VAC;  Surgeon: Floyce Hutching, DPM;  Location: [facility];  Service: Podiatry;;   FLAT FOOT RECONSTRUCTION-TAL GASTROC RECESSION Left 03/08/2022   Procedure: FLAT FOOT RECONSTRUCTION WITH FUSION OF JOINTS IN BACK OF FOOT; POSSIBLE CALF MUSCLE LENGTHENING;BONEGRAFT INTO FOOT TO BUILD ARCH; BONEGRAFT FROM LEG;  Surgeon: Floyce Hutching, DPM;  Location: [facility];  Service: Podiatry;  Laterality: Left;  POPLITEL AND SAPHENOUS BLOCK   LEG SURGERY     within a month of birth, in cast for infancy    Social History Shawn Harper  reports that he has never smoked. He has never used smokeless tobacco. He reports that he does not currently use alcohol. He reports current drug use. Drug: Marijuana.  family history includes Anxiety disorder in his mother; COPD in his maternal grandmother; Colon polyps in his mother; Depression in his mother; Esophageal cancer in his maternal grandmother; Heart disease in his maternal grandfather; Hyperlipidemia in his mother; Hypertension in his mother; Obesity in his sister; Other in his father; Suicidality in his brother; Syncope episode in his sister.  Allergies  Allergen Reactions   Doxycycline Nausea And Vomiting       PHYSICAL EXAMINATION: Vital signs: BP 120/61   Pulse 61   Temp (!) 97.3 F (36.3 C) (Temporal)   Ht 6' (1.829 m)   Wt 170 lb (77.1 kg)   SpO2 98%   BMI 23.06 kg/m  General: Well-developed, well-nourished, no acute distress HEENT: Sclerae are  anicteric, conjunctiva pink. Oral mucosa intact Lungs: Clear Heart: Regular Abdomen: soft, nontender, nondistended, no obvious ascites, no peritoneal signs, normal bowel sounds. No organomegaly. Extremities: No edema Psychiatric: alert and oriented x3. Cooperative     ASSESSMENT:  Rectal bleeding Family history of colon polyps   PLAN:  Colonoscopy

## 2024-01-05 ENCOUNTER — Other Ambulatory Visit: Payer: Self-pay | Admitting: Physical Medicine and Rehabilitation

## 2024-01-05 DIAGNOSIS — M5440 Lumbago with sciatica, unspecified side: Secondary | ICD-10-CM

## 2024-01-06 ENCOUNTER — Telehealth: Payer: Self-pay | Admitting: *Deleted

## 2024-01-06 NOTE — Telephone Encounter (Signed)
  Follow up Call-     01/02/2024    1:40 PM  Call back number  Post procedure Call Back phone  # [phone]  Permission to leave phone message Yes     Patient questions:  Do you have a fever, pain , or abdominal swelling? No. Pain Score  0 *  Have you tolerated food without any problems? Yes.    Have you been able to return to your normal activities? Yes.    Do you have any questions about your discharge instructions: Diet   No. Medications  No. Follow up visit  No.  Do you have questions or concerns about your Care? No.  Actions: * If pain score is 4 or above: No action needed, pain <4.

## 2024-01-08 ENCOUNTER — Ambulatory Visit: Payer: Self-pay | Admitting: Internal Medicine

## 2024-01-08 LAB — SURGICAL PATHOLOGY

## 2024-01-19 ENCOUNTER — Encounter: Payer: Medicaid Other | Admitting: Physical Medicine and Rehabilitation

## 2024-01-21 ENCOUNTER — Telehealth: Payer: Self-pay | Admitting: Internal Medicine

## 2024-01-21 NOTE — Telephone Encounter (Signed)
 I do not perform hemorrhoidal banding. Please refer this patient to one of my partners who does perform in office hemorrhoidal banding procedure. Thank you, Dr. Elvin Hammer

## 2024-01-21 NOTE — Telephone Encounter (Signed)
 Good afternoon Dr. Elvin Hammer  The following patient called to schedule a hem banding and you have no availability. Please review and advise of how I should schedule him. Thank you.

## 2024-01-22 NOTE — Telephone Encounter (Signed)
 Good morning,   Would any of the following providers be willing to schedule a hem banding with this  patient. Please advise. Thank you.

## 2024-01-22 NOTE — Telephone Encounter (Signed)
 I am able to see the patient - please explain that the banding is not 100 % guaranteed to happen though likely it will

## 2024-01-26 ENCOUNTER — Encounter: Attending: Physical Medicine and Rehabilitation | Admitting: Physical Medicine and Rehabilitation

## 2024-02-06 ENCOUNTER — Encounter: Admitting: Internal Medicine

## 2024-02-19 ENCOUNTER — Ambulatory Visit: Admitting: Internal Medicine

## 2024-02-19 ENCOUNTER — Encounter: Payer: Self-pay | Admitting: Internal Medicine

## 2024-02-19 VITALS — BP 110/70 | HR 80 | Ht 71.5 in | Wt 163.5 lb

## 2024-02-19 DIAGNOSIS — K649 Unspecified hemorrhoids: Secondary | ICD-10-CM

## 2024-02-19 DIAGNOSIS — K601 Chronic anal fissure: Secondary | ICD-10-CM | POA: Diagnosis not present

## 2024-02-19 MED ORDER — AMBULATORY NON FORMULARY MEDICATION: 3 refills | Status: AC

## 2024-02-19 NOTE — Progress Notes (Signed)
 Shawn Harper 27 y.o. 05-Oct-1995 [id number]  Assessment & Plan:   Encounter Diagnoses  Name Primary?   Chronic posterior anal fissure Yes   Hemorrhoids   His major problem today is a fissure and significant anal spasm.  I do not think he would tolerate hemorrhoidal ligation and he agrees.  Will treat the fissure with diltiazem and lidocaine  mixture as below.  Sitz bath's also possibly.  Increase fiber capsules from from 1 daily to 2-4 a day to promote more regular and softer defecation.  Try to reduce straining to stool.  Return in 2 months to reassess.  Meds ordered this encounter  Medications   AMBULATORY NON FORMULARY MEDICATION    Sig: Medication Name: Diltiazem 2% mixed with Lidocaine  5% Sig: apply a pea size amount inside rectum 2-3 times daily    Dispense:  30 g    Refill:  3   CC: Hunter, Garnette KIDD, MD Norleen Kiang, MD   Subjective:   Chief Complaint: Hemorrhoids  HPI 28 year old man with rectal bleeding, status post colonoscopy by Dr. Kiang Jan 02, 2024.  A 3 mm adenoma was removed and he had moderate internal hemorrhoids described.  He was referred to me for consideration of hemorrhoidal ligation.  The patient reports sensation to defecate frequently throughout the day and he sits and he tries to defecate and sometimes produces a small amount of stool.  He has been taking a fiber supplement in the form of a capsule which is helping some.  He strains quite a bit and he sits on the toilet for a while.  He does think there is tissue prolapsing at times also.  It is painful during and after defecation as well as sharp pain and some burning pain.  He continues to have intermittent bright red blood rectal bleeding.  This has been a problem for about 6 months.  He does attribute some of his constipation symptoms to his medications.  He is trying to recover from a foot surgery to reconstruct a flatfoot and that is taking a long time. Allergies  Allergen Reactions   Doxycycline  Nausea And Vomiting   Current Meds  Medication Sig   DULoxetine (CYMBALTA) 60 MG capsule Take 60 mg by mouth 2 (two) times daily.   gabapentin  (NEURONTIN ) 300 MG capsule Take 1 capsule (300 mg total) by mouth 2 (two) times daily.   hydrOXYzine (ATARAX) 10 MG tablet Take 10 mg by mouth daily.   meloxicam  (MOBIC ) 7.5 MG tablet Start with one tablet in the morning. If no side effects after 1 week, you can increase it to 2 tablets in the morning. Do not take with other over the counter NSAIDs like ibuprofen ; you can take this with tylenol .   methylphenidate 27 MG PO CR tablet Take 27 mg by mouth every morning.   VITAMIN D PO Take 1 tablet by mouth daily.   Past Medical History:  Diagnosis Date   ADHD    Anxiety    Arthritis    Depression    Dysrhythmia    Headache    Smoker    5 pack years, trying to quit   Past Surgical History:  Procedure Laterality Date   APPLICATION OF WOUND VAC  03/08/2022   Procedure: APPLICATION OF WOUND VAC;  Surgeon: Silva Juliene SAUNDERS, DPM;  Location: [facility];  Service: Podiatry;;   FLAT FOOT RECONSTRUCTION-TAL GASTROC RECESSION Left 03/08/2022   Procedure: FLAT FOOT RECONSTRUCTION WITH FUSION OF JOINTS IN BACK OF FOOT; POSSIBLE  CALF MUSCLE LENGTHENING;BONEGRAFT INTO FOOT TO BUILD ARCH; BONEGRAFT FROM LEG;  Surgeon: Silva Juliene SAUNDERS, DPM;  Location: [facility];  Service: Podiatry;  Laterality: Left;  POPLITEL AND SAPHENOUS BLOCK   LEG SURGERY     within a month of birth, in cast for infancy   Social History   Social History Narrative   Engaged July 2024-Long term relationship since 409. 70 year old son Toribio in 12/2017.    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    HS graduate       Hobbies: time with girlfriend and son, netflix, working out- really enjoys   -prior photography   family history includes Anxiety disorder in his mother; COPD in his maternal grandmother; Colon polyps in his mother; Depression in his mother; Esophageal cancer in his maternal grandmother; Heart disease in his maternal grandfather; Hyperlipidemia in his mother; Hypertension in his mother; Obesity in his sister; Other in his father; Suicidality in his brother; Syncope episode in his sister.   Review of Systems As per HPI  Objective:   Physical Exam BP 110/70 (BP Location: Left Arm, Patient Position: Sitting, Cuff Size: Normal)   Pulse 80   Ht 5' 11.5 (1.816 m) Comment: height measured without shoes  Wt 163 lb 8 oz (74.2 kg)   BMI 22.49 kg/m   Rectal (he declined chaperone)  Anoderm - posterior external pile -digital exam is very tender and indurated posterior anal canal  Ansocopy - posterior anal fissure - there are some hemorrhoids but unable to insert the anoscope completely to fully characterize

## 2024-02-19 NOTE — Patient Instructions (Addendum)
 Increase your fiber capsules to 2 to 4 daily.  We have provided you with a handout to read on anal fissures.  You may soak in a sitz bath as needed. Handout provided.  Your provider has prescribed a rectal fissure gel for you. Please follow the directions written on your prescription bottle or given to you specifically by your provider. Since this is a specialty medication and is not readily available at most local pharmacies, we have sent your prescription to:  [facility] information is below: Address: [address] [place], [place] [address]  [phone]  *Please DO NOT go directly from our office to pick up this medication! Give the pharmacy 1 day to process the prescription as this is compounded and takes time to make.   I appreciate the opportunity to care for you. Lupita Commander, MD, [facility]

## 2024-03-01 ENCOUNTER — Encounter: Payer: Medicaid Other | Admitting: Family Medicine

## 2024-04-29 ENCOUNTER — Ambulatory Visit: Admitting: Internal Medicine

## 2024-08-31 ENCOUNTER — Other Ambulatory Visit: Payer: Self-pay | Admitting: Physical Medicine and Rehabilitation
